# Patient Record
Sex: Male | Born: 1955
Health system: Southern US, Community
[De-identification: ages and names within clinical notes are randomized; demographics above are authoritative.]

## PROBLEM LIST (undated history)

## (undated) DIAGNOSIS — I1A0 Resistant hypertension: Secondary | ICD-10-CM

## (undated) DIAGNOSIS — M5126 Other intervertebral disc displacement, lumbar region: Secondary | ICD-10-CM

## (undated) DIAGNOSIS — G4719 Other hypersomnia: Principal | ICD-10-CM

## (undated) DIAGNOSIS — G4730 Sleep apnea, unspecified: Secondary | ICD-10-CM

## (undated) DIAGNOSIS — Z800 Family history of malignant neoplasm of digestive organs: Secondary | ICD-10-CM

## (undated) DIAGNOSIS — F419 Anxiety disorder, unspecified: Secondary | ICD-10-CM

## (undated) DIAGNOSIS — E669 Obesity, unspecified: Secondary | ICD-10-CM

## (undated) DIAGNOSIS — E782 Mixed hyperlipidemia: Secondary | ICD-10-CM

## (undated) DIAGNOSIS — I10 Essential (primary) hypertension: Secondary | ICD-10-CM

## (undated) DIAGNOSIS — K625 Hemorrhage of anus and rectum: Secondary | ICD-10-CM

## (undated) HISTORY — DX: Anxiety disorder, unspecified: F41.9

## (undated) HISTORY — DX: Resistant hypertension: I1A.0

## (undated) HISTORY — DX: Hemorrhage of anus and rectum: K62.5

## (undated) HISTORY — DX: Other hypersomnia: G47.19

## (undated) HISTORY — DX: Essential (primary) hypertension: I10

## (undated) HISTORY — DX: Family history of malignant neoplasm of digestive organs: Z80.0

## (undated) HISTORY — PX: TONSILLECTOMY: SUR1361

## (undated) HISTORY — DX: Mixed hyperlipidemia: E78.2

## (undated) HISTORY — DX: Sleep apnea, unspecified: G47.30

## (undated) HISTORY — PX: OTHER SURGICAL HISTORY: SHX169

## (undated) HISTORY — DX: Other intervertebral disc displacement, lumbar region: M51.26

## (undated) HISTORY — DX: Obesity, unspecified: E66.9

---

## 1997-09-23 ENCOUNTER — Ambulatory Visit (HOSPITAL_COMMUNITY): Admission: RE | Admit: 1997-09-23 | Discharge: 1997-09-23 | Payer: Self-pay | Admitting: Family Medicine

## 2002-04-01 ENCOUNTER — Emergency Department (HOSPITAL_COMMUNITY): Admission: EM | Admit: 2002-04-01 | Discharge: 2002-04-01 | Payer: Self-pay | Admitting: Emergency Medicine

## 2002-04-01 ENCOUNTER — Encounter: Payer: Self-pay | Admitting: Emergency Medicine

## 2003-01-14 ENCOUNTER — Observation Stay (HOSPITAL_COMMUNITY): Admission: AD | Admit: 2003-01-14 | Discharge: 2003-01-15 | Payer: Self-pay | Admitting: Emergency Medicine

## 2003-03-15 ENCOUNTER — Ambulatory Visit (HOSPITAL_BASED_OUTPATIENT_CLINIC_OR_DEPARTMENT_OTHER): Admission: RE | Admit: 2003-03-15 | Discharge: 2003-03-15 | Payer: Self-pay | Admitting: Orthopedic Surgery

## 2011-09-16 ENCOUNTER — Ambulatory Visit (INDEPENDENT_AMBULATORY_CARE_PROVIDER_SITE_OTHER): Payer: 59 | Admitting: Internal Medicine

## 2011-09-16 VITALS — BP 140/90 | HR 72 | Temp 98.0°F | Resp 16 | Ht 67.5 in | Wt 188.0 lb

## 2011-09-16 DIAGNOSIS — T63441A Toxic effect of venom of bees, accidental (unintentional), initial encounter: Secondary | ICD-10-CM

## 2011-09-16 DIAGNOSIS — G4730 Sleep apnea, unspecified: Secondary | ICD-10-CM

## 2011-09-16 DIAGNOSIS — T485X5A Adverse effect of other anti-common-cold drugs, initial encounter: Secondary | ICD-10-CM

## 2011-09-16 DIAGNOSIS — J3489 Other specified disorders of nose and nasal sinuses: Secondary | ICD-10-CM

## 2011-09-16 DIAGNOSIS — R609 Edema, unspecified: Secondary | ICD-10-CM

## 2011-09-16 DIAGNOSIS — T6391XA Toxic effect of contact with unspecified venomous animal, accidental (unintentional), initial encounter: Secondary | ICD-10-CM

## 2011-09-16 MED ORDER — AZELASTINE HCL 0.15 % NA SOLN: 2.0000 | Freq: Every day | NASAL | Status: DC

## 2011-09-16 MED ORDER — PREDNISONE 20 MG PO TABS: ORAL_TABLET | ORAL | Status: DC

## 2011-09-16 NOTE — Progress Notes (Signed)
  Subjective:    Patient ID: Alan Spencer, male    DOB: [birth year], 55 y.o.   MRN: [id number]  HPIBee sting 48 hours ago Swelling left forearm increasing/itching/no pain/Has had past cutaneous reactions to stinging insects  Also complaining of chronic nasal congestion/this has become worse with use of CPAP/he uses Afrin once or twice a day over the past year And is discovering to be less and less effective/and Flonase and Nasonex plus other nasal steroids have not been helpful/has intermittent allergy symptoms of the nose denies but not enough to take medicines every day No asthma or cough    Review of SystemsHypertension and idiopathic edema followed by cardiology/recent stress echo within normal limits/Lasix and spra lactone don't completely control his peripheral edema which started before he was on Norvasc No chest pains or palpitations GI and GU are negative     Objective:   Physical Exam Blood pressure 140/90 In no acute distress/mild overweight Conjunctiva slightly injected Nares boggy with clear rhinorrhea Throat clear No nodes or thyromegaly Left arm with 6 cm x 12 cm induration and pitting over the lateral forearm/no vesicles/general redness without specific lesions/no pustules/nontender       Assessment & Plan:   1. Rhinitis medicamentosa -He should improve with treatment for #3/will try adding astepro   at bedtime/If not effective will consider Singulair/DC Afrin  2. Sleep apnea   3. Bee sting reaction -Antihistamines/prednisone for 6 days  4. Edema -Idiopathic/followed for this and hypertension by cardiology   Followup one to 2 months

## 2013-03-29 ENCOUNTER — Other Ambulatory Visit: Payer: Self-pay | Admitting: Interventional Cardiology

## 2013-04-04 ENCOUNTER — Other Ambulatory Visit: Payer: Self-pay | Admitting: Interventional Cardiology

## 2013-04-07 NOTE — Telephone Encounter (Signed)
Phoned IN

## 2013-04-07 NOTE — Telephone Encounter (Signed)
Per Dr. Irish Lack, ok to refil. I will call into pharm.

## 2013-04-26 ENCOUNTER — Other Ambulatory Visit: Payer: Self-pay | Admitting: Interventional Cardiology

## 2013-11-10 ENCOUNTER — Other Ambulatory Visit: Payer: 59

## 2013-11-10 ENCOUNTER — Ambulatory Visit (INDEPENDENT_AMBULATORY_CARE_PROVIDER_SITE_OTHER): Payer: 59 | Admitting: Interventional Cardiology

## 2013-11-10 ENCOUNTER — Ambulatory Visit: Payer: Self-pay | Admitting: Interventional Cardiology

## 2013-11-10 ENCOUNTER — Encounter: Payer: Self-pay | Admitting: Cardiology

## 2013-11-10 ENCOUNTER — Encounter: Payer: Self-pay | Admitting: Interventional Cardiology

## 2013-11-10 VITALS — BP 172/78 | HR 65 | Ht 67.5 in | Wt 190.2 lb

## 2013-11-10 DIAGNOSIS — E782 Mixed hyperlipidemia: Secondary | ICD-10-CM | POA: Insufficient documentation

## 2013-11-10 DIAGNOSIS — E876 Hypokalemia: Secondary | ICD-10-CM | POA: Insufficient documentation

## 2013-11-10 DIAGNOSIS — I1A0 Resistant hypertension: Secondary | ICD-10-CM | POA: Insufficient documentation

## 2013-11-10 DIAGNOSIS — I10 Essential (primary) hypertension: Secondary | ICD-10-CM

## 2013-11-10 DIAGNOSIS — G4733 Obstructive sleep apnea (adult) (pediatric): Secondary | ICD-10-CM | POA: Insufficient documentation

## 2013-11-10 DIAGNOSIS — Z9989 Dependence on other enabling machines and devices: Secondary | ICD-10-CM

## 2013-11-10 MED ORDER — FUROSEMIDE 40 MG PO TABS
40.0000 mg | ORAL_TABLET | Freq: Every day | ORAL | Status: DC
Start: 2013-11-10 — End: 2015-03-06

## 2013-11-10 MED ORDER — POTASSIUM CHLORIDE CRYS ER 20 MEQ PO TBCR: 20.0000 meq | EXTENDED_RELEASE_TABLET | Freq: Every day | ORAL | Status: DC

## 2013-11-10 NOTE — Patient Instructions (Signed)
Your physician has recommended you make the following change in your medication:   1. Start Lasix 40 mg 1 tablet daily.   2. Start Klor-Con 20 MEQ 1 tablet daily.   Your physician has recommended that you have a sleep study. This test records several body functions during sleep, including: brain activity, eye movement, oxygen and carbon dioxide blood levels, heart rate and rhythm, breathing rate and rhythm, the flow of air through your mouth and nose, snoring, body muscle movements, and chest and belly movement.  Your physician recommends that you return for lab work in 1 week at Dr. Shon Baton office (bmet and calcium)  Your physician recommends that you schedule a follow-up appointment in: 3 months with Dr. Irish Lack.

## 2013-11-10 NOTE — Progress Notes (Signed)
Patient ID: Alan Spencer, male   DOB: 1955/12/22, 58 y.o.   MRN: [id number]    Alan Spencer, Alan Spencer Alan Spencer, Alan Spencer  Alan Spencer Phone: [phone] Fax:  [phone]  Date:  11/10/2013   ID:  Alan Spencer, Alan Spencer Mar 23, 1955, MRN [id number]  PCP:  No primary provider on file.      History of Present Illness: Alan Spencer is a 58 y.o. male who has had difficult to control hypertension. He has been on multiple medications. Saw him in 2013 prior to adjusting his medicines, his systolics were in the 244Q to 190 range consistently. This has improved significantly over the past year. He is now typically seen blood pressures in the 286-381 range systolic. He has not had any significant headaches, chest pain or short of breath. He plays tennis regularly for exercise. He feels that he had a lot of stress with his last two josb. His current job is much better.  When he is off from work, his blood pressure tends to be better.  Due to increased calcium, his spironolactone was stopped.      Wt Readings from Last 3 Encounters:  11/10/13 190 lb 3.2 oz (86.274 kg)  09/16/11 188 lb (85.276 kg)     Past Medical History  Diagnosis Date  . Essential hypertension, benign   . Mixed hyperlipidemia   . Anxiety   . Sleep apnea     uses CPAP  . HNP (herniated nucleus pulposus), lumbar     L4-L5  . Family history of colon cancer   . Rectal bleeding     Current Outpatient Prescriptions  Medication Sig Dispense Refill  . amLODipine (NORVASC) 10 MG tablet TAKE 1 TABLET BY MOUTH ONCE DAILY  90 tablet  2  . fish oil-omega-3 fatty acids 1000 MG capsule Take 2 g by mouth daily.      . irbesartan (AVAPRO) 300 MG tablet TAKE 1 TABLET BY MOUTH ONCE DAILY  90 tablet  2  . Multiple Vitamins-Minerals (MULTIVITAMIN WITH MINERALS) tablet Take 1 tablet by mouth daily.      . rosuvastatin (CRESTOR) 10 MG tablet Take 10 mg by mouth daily.       No current facility-administered medications for this visit.     Allergies:   No Known Allergies  Social History:  The patient  reports that he has never smoked. He does not have any smokeless tobacco history on file. He reports that he does not drink alcohol or use illicit drugs.   Family History:  The patient's family history includes Colon cancer in his paternal grandmother; Hypertension in his mother.   ROS:  Please see the history of present illness.  No nausea, vomiting.  No fevers, chills.  No focal weakness.  No dysuria.   All other systems reviewed and negative.   PHYSICAL EXAM: VS:  BP 172/78  Pulse 65  Ht 5' 7.5" (1.715 m)  Wt 190 lb 3.2 oz (86.274 kg)  BMI 29.33 kg/m2 Well nourished, well developed, in no acute distress HEENT: normal Neck: no JVD, no carotid bruits Cardiac:  normal S1, S2; RRR;  Lungs:  clear to auscultation bilaterally, no wheezing, rhonchi or rales Abd: soft, nontender, no hepatomegaly Ext: no edema Skin: warm and dry Neuro:   no focal abnormalities noted  EKG:     NSR, NSST  ASSESSMENT AND PLAN:  Hypertension, essential  Now off of Aldactone Tablet, 25 MG, 1 tablet, Orally, once a day- stopped for increased  calcium. Continue Amlodipine Besylate Tablet, 10 MG, 1 tablet, Orally, Once a day Continue Irbesartan Tablet, 300 MG, 1 tablet, Orally, Once a day    Alan Spencer 11/11/2012 10:59:37 AM > Alan Spencer,Alan Spencer 11/11/2012 11:06:57 AM > NSR, NSST   Notes: Blood pressure better. Sill some high readings up to the 403B systolic.  Continue current medicines. Needs sleep study- has been on CPAP or many years and has old machine.  Lasix was stopped due to low potassium, but swelling has gotten worse.  Will restart Lasix 40 mg daily with 47mq Potassium daily.  If BP stays elevated, will add back aldactone.  Lasix should lower serum calcium.  BP 162/72 by my recheck.   Mixed hyperlipidemia: TG have been quite high.  Wil have pharmacist assess and follow in lipid clinic. 10/28/13: Ca 10.4; potassium 3.3 ( on Lasix); LDL  147, TG 470, TC 280   OSA: repeat sleep study.  Better control of OSA may help BP.          Lab: Comp Metabolic Panel 87/95 GLUCOSE 101 H 70-99 - mg/dL  BUN 19  6-26 - mg/dL  CREATININE 1.01  0.60-1.30 - mg/dl  eGFR (NON-AFRICAN AMERICAN) 76  >60 - calc  eGFR (AFRICAN AMERICAN) 92  >60 - calc  SODIUM 141  136-145 - mmol/L  POTASSIUM 4.2  3.5-5.5 - mmol/L  CHLORIDE 107  98-107 - mmol/L  C02 29  22-32 - mmol/L  ANION GAP 8.5  6.0-20.0 - mmol/L  CALCIUM 11.2 H 8.6-10.3 - mg/dL  T PROTEIN 7.1  6.0-8.3 - g/dL  ALBUMIN 4.8  3.4-4.8 - g/dL  T.BILI 0.8  0.3-1.0 - mg/dL  ALP 83  38-126 - U/L  AST 29  0-39 - U/L  ALT 34  0-52 - U/L        Signed, JMina Marble MD, FWalnut [facility] Medical Center8/27/2015 11:46 AM

## 2013-11-11 ENCOUNTER — Telehealth: Payer: Self-pay | Admitting: Pharmacist

## 2013-11-11 NOTE — Telephone Encounter (Signed)
Recent labs from [facility] show TG of 470, TC 280, LDL 147, and HDL of 39 on 10/2013 labs.  In 2008 his TG were as high as 2092.  He was given Lovaza, Lopid, and Trilipix at various times around 2010.  Currently he is on Crestor and fish oil 2 g/d.  His renal fxn is normal.  Glucose is normal as well, so this is not a contributing factor.  He does not appear to be on any medications that are contributing to his TG levels.    Will have him add fenofibrate 160 mg qd (must take with largest meal of the day), and have him increase fish oil 1000 mg to 4 capsules per day with food.  Recheck lipid panel and hepatic panel in 8 weeks.  Please notify patient, update meds, and set up labs. Thanks.

## 2013-11-16 NOTE — Telephone Encounter (Signed)
agree

## 2013-11-16 NOTE — Telephone Encounter (Signed)
Spoke with pt and he was started on crestor right after labs were drawn. He will be going back for lipid check within the next couple of weeks. He will have labs faxed to [place]. He would like to wait until after next check to see where he stands before adding medication.

## 2013-12-26 ENCOUNTER — Encounter (HOSPITAL_BASED_OUTPATIENT_CLINIC_OR_DEPARTMENT_OTHER): Payer: 59

## 2014-01-10 ENCOUNTER — Other Ambulatory Visit: Payer: Self-pay | Admitting: Interventional Cardiology

## 2014-01-29 ENCOUNTER — Encounter: Payer: Self-pay | Admitting: *Deleted

## 2014-02-07 ENCOUNTER — Ambulatory Visit: Payer: 59 | Admitting: Interventional Cardiology

## 2014-02-17 ENCOUNTER — Ambulatory Visit (HOSPITAL_BASED_OUTPATIENT_CLINIC_OR_DEPARTMENT_OTHER): Payer: 59 | Attending: Cardiology | Admitting: Sleep Medicine

## 2014-02-17 DIAGNOSIS — G4710 Hypersomnia, unspecified: Secondary | ICD-10-CM | POA: Diagnosis present

## 2014-02-17 DIAGNOSIS — Z9989 Dependence on other enabling machines and devices: Secondary | ICD-10-CM

## 2014-02-17 DIAGNOSIS — Z6828 Body mass index (BMI) 28.0-28.9, adult: Secondary | ICD-10-CM | POA: Diagnosis not present

## 2014-02-17 DIAGNOSIS — G4733 Obstructive sleep apnea (adult) (pediatric): Secondary | ICD-10-CM | POA: Diagnosis not present

## 2014-02-27 ENCOUNTER — Ambulatory Visit: Payer: 59 | Admitting: Interventional Cardiology

## 2014-03-01 ENCOUNTER — Telehealth: Payer: Self-pay | Admitting: Cardiology

## 2014-03-01 DIAGNOSIS — Z9989 Dependence on other enabling machines and devices: Principal | ICD-10-CM

## 2014-03-01 DIAGNOSIS — G4733 Obstructive sleep apnea (adult) (pediatric): Secondary | ICD-10-CM

## 2014-03-01 NOTE — Telephone Encounter (Signed)
Please let patient know that she has severe OSA and set up for CPAP titration

## 2014-03-01 NOTE — Sleep Study (Signed)
   NAME: Alan Spencer DATE OF BIRTH:  [birth year] MEDICAL RECORD NUMBER [id number]  LOCATION: [facility]  PHYSICIAN: Garland Smouse R  DATE OF STUDY: 02/17/2014  SLEEP STUDY TYPE: Nocturnal Polysomnogram               REFERRING PHYSICIAN: Sueanne Margarita, MD  INDICATION FOR STUDY: excessive daytime sleepiness  EPWORTH SLEEPINESS SCORE: 11 HEIGHT: 5\' 8"  (172.7 cm)  WEIGHT: 185 lb (83.915 kg)    Body mass index is 28.14 kg/(m^2).  NECK SIZE: 16 in. BMI:28  MEDICATIONS: Reviewed in the chart  SLEEP ARCHITECTURE: The patient slept for a total of 160 minutes with no slow wave sleep and markedly reduced REM sleep at 4 minutes. The onset to sleep latency was delayed at 50 minutes and onset to REM latency was delayed at 207 minutes.  The sleep efficiency was reduced at 41%.    RESPIRATORY DATA: The patient had a total of 31 apneic events with 29 obstructive apneas and 2 mixed apneas.  He had 75 hypopneas.  Most events occurred in the supine position during NREM sleep.  The overall AHI was 40 events per hour consistent with severe obstructive sleep apnea hypopnea syndrome.  OXYGEN DATA: The O2 saturation nadir was 81% in NREM sleep and 84% in REM sleep.  The time spent with O2 sats less than 88% was 1 minute.  CARDIAC DATA: The patient maintained NSR throughout the study.   MOVEMENT/PARASOMNIA: There were no periodic limb movements or REM behavior disorders noted.  IMPRESSION/ RECOMMENDATION:   1.  Severe Obstructive sleep apnea/hypopnea syndrome with an AHI of 40 events per hour.  Most events occurred in the NREM supine position. 2.  Reduced sleep efficiency with increased frequency of arousals due to respiratory events.    3.  Abnormal sleep architecture with reduced REM sleep and prolonged REM onset latency.  4.  Frequent O2 desaturations associated with respiratory events.   5.  Recommend proceeding with CPAP titration. 6.  The patient should be counseled in good  sleep hygiene and weight loss. 7.  The patient should be counseled to avoid sleeping supine.  Signed: Sueanne Margarita Diplomate, American Board of Sleep Medicine  ELECTRONICALLY SIGNED ON:  03/01/2014, 2:19 PM [place] PH: [phone]   FX: [phone] [place]

## 2014-03-02 ENCOUNTER — Encounter: Payer: Self-pay | Admitting: Interventional Cardiology

## 2014-03-02 NOTE — Progress Notes (Signed)
Patient ID: Alan Spencer, male   DOB: 10-09-1955, 58 y.o.   MRN: [id number] Patient with BP to the 175/990 range.  Add aldactone 25 mg daily.  He will get a BMet on 12/23 at his office and get [place] results by 12/24. May need to stop supplemental potassium.  Patient reports LE edema.  Spironolactone may help.  He will f/u with me in Jan 2016.  Recent potassium 3.3, Cr 1.0  Jettie Booze, MD

## 2014-03-06 NOTE — Telephone Encounter (Signed)
Informed patient that he has severe OSA. CPAP titration ordered for scheduling

## 2014-03-06 NOTE — Addendum Note (Signed)
Addended by: Andres Ege on: 03/06/2014 11:58 AM   Modules accepted: Orders

## 2014-03-21 ENCOUNTER — Encounter (HOSPITAL_BASED_OUTPATIENT_CLINIC_OR_DEPARTMENT_OTHER): Payer: 59

## 2014-03-30 ENCOUNTER — Ambulatory Visit: Payer: 59 | Admitting: Interventional Cardiology

## 2014-04-05 ENCOUNTER — Ambulatory Visit: Payer: 59 | Admitting: Interventional Cardiology

## 2014-04-05 ENCOUNTER — Encounter: Payer: Self-pay | Admitting: Interventional Cardiology

## 2014-04-05 ENCOUNTER — Ambulatory Visit (INDEPENDENT_AMBULATORY_CARE_PROVIDER_SITE_OTHER): Payer: 59 | Admitting: Interventional Cardiology

## 2014-04-05 VITALS — BP 164/90 | HR 80 | Ht 68.0 in | Wt 189.0 lb

## 2014-04-05 DIAGNOSIS — I10 Essential (primary) hypertension: Secondary | ICD-10-CM

## 2014-04-05 DIAGNOSIS — E782 Mixed hyperlipidemia: Secondary | ICD-10-CM

## 2014-04-05 DIAGNOSIS — Z9989 Dependence on other enabling machines and devices: Secondary | ICD-10-CM

## 2014-04-05 DIAGNOSIS — G4733 Obstructive sleep apnea (adult) (pediatric): Secondary | ICD-10-CM

## 2014-04-05 MED ORDER — CARVEDILOL 3.125 MG PO TABS: 3.1250 mg | ORAL_TABLET | Freq: Two times a day (BID) | ORAL | Status: DC

## 2014-04-05 NOTE — Progress Notes (Signed)
Patient ID: Alan Spencer, male   DOB: 10/01/1955, 59 y.o.   MRN: [id number] Patient ID: Alan Spencer, male   DOB: 04-Jul-1955, 59 y.o.   MRN: [id number]    [place] [place], [place]  [address] Phone: [phone] Fax:  [phone]  Date:  04/05/2014   ID:  Alan Spencer, Alan Spencer June 30, 1955, MRN [id number]  PCP:  Donnajean Lopes, MD      History of Present Illness: Alan Spencer is a 59 y.o. male who has had difficult to control hypertension. He has been on multiple medications.  Systolics have been 979-892J systolic. Saw him in 2013 prior to adjusting his medicines, his systolics were in the 194R to 190 range consistently. This has improved significantly over the past year.  He has not had any significant headaches, chest pain or short of breath. He plays tennis regularly for exercise. Stress level is ok at work. His current job is much better.  When he is off from work, his blood pressure tends to be better.  Aldactone was increased to 50 mg daily.  POtassium and Cr were checked with his PMD and were stable.         Wt Readings from Last 3 Encounters:  04/05/14 189 lb (85.73 kg)  02/17/14 185 lb (83.915 kg)  11/10/13 190 lb 3.2 oz (86.274 kg)     Past Medical History  Diagnosis Date  . Essential hypertension, benign   . Mixed hyperlipidemia   . Anxiety   . Sleep apnea     uses CPAP  . HNP (herniated nucleus pulposus), lumbar     L4-L5  . Family history of colon cancer   . Rectal bleeding     Current Outpatient Prescriptions  Medication Sig Dispense Refill  . amLODipine (NORVASC) 10 MG tablet TAKE 1 TABLET BY MOUTH ONCE DAILY 90 tablet 0  . fish oil-omega-3 fatty acids 1000 MG capsule Take 2 g by mouth daily.    . furosemide (LASIX) 40 MG tablet Take 1 tablet (40 mg total) by mouth daily. 90 tablet 3  . irbesartan (AVAPRO) 300 MG tablet TAKE 1 TABLET BY MOUTH ONCE DAILY 90 tablet 0  . Multiple Vitamins-Minerals (MULTIVITAMIN WITH MINERALS) tablet Take 1  tablet by mouth daily.    . potassium chloride SA (KLOR-CON M20) 20 MEQ tablet Take 1 tablet (20 mEq total) by mouth daily. 90 tablet 3  . rosuvastatin (CRESTOR) 10 MG tablet Take 10 mg by mouth daily.    Marland Kitchen spironolactone (ALDACTONE) 25 MG tablet Take 50 mg by mouth daily.     No current facility-administered medications for this visit.    Allergies:   No Known Allergies  Social History:  The patient  reports that he has never smoked. He does not have any smokeless tobacco history on file. He reports that he does not drink alcohol or use illicit drugs.   Family History:  The patient's family history includes Colon cancer in his paternal grandmother; Hypertension in his mother.   ROS:  Please see the history of present illness.  No nausea, vomiting.  No fevers, chills.  No focal weakness.  No dysuria.   All other systems reviewed and negative.   PHYSICAL EXAM: VS:  BP 164/90 mmHg  Pulse 80  Ht '5\' 8"'  (1.727 m)  Wt 189 lb (85.73 kg)  BMI 28.74 kg/m2 Well nourished, well developed, in no acute distress HEENT: normal Neck: no JVD, no carotid bruits Cardiac:  normal  S1, S2; RRR;  Lungs:  clear to auscultation bilaterally, no wheezing, rhonchi or rales Abd: soft, nontender, no hepatomegaly Ext: no edema Skin: warm and dry Neuro:   no focal abnormalities noted Psych: normal affect  EKG:     NSR, NSST  ASSESSMENT AND PLAN:  Hypertension, essential  Aldactone Tablet, 50 MG, 1 tablet, Orally, once a day Continue Amlodipine Besylate Tablet, 10 MG, 1 tablet, Orally, Once a day Continue Irbesartan Tablet, 300 MG, 1 tablet, Orally, Once a day Start Carvedilol 3.125 mg BID   Renal Duplex.- he will get this done at [facility]   Notes: Blood pressure better. Still some high readings up to the 098J systolic.  Continue current medicines. Needs sleep study- has been on CPAP or many years and has old machine.  Lasix was stopped due to low potassium, but swelling has gotten worse.   Continue  Lasix 40 mg daily with 69mq Potassium daily.  If BP stays elevated, will add back aldactone.  Lasix should lower serum calcium.  BP 154/72 by my recheck.   Mixed hyperlipidemia: TG have been quite high.  Wil have pharmacist assess and follow in lipid clinic. 10/28/13: Ca 10.4; potassium 3.3 ( on Lasix); LDL 147, TG 470, TC 280  Lipids were checked on 03/08/14 (per his report): TG: 572   Chol: 177  LDL 33  HDL 30        OSA: He needs repeat sleep study.  Better control of OSA may help BP.     [facility]    Lab: Comp Metabolic Panel 81/91 GLUCOSE 101 H 70-99 - mg/dL  BUN 19  6-26 - mg/dL  CREATININE 1.01  0.60-1.30 - mg/dl  eGFR (NON-AFRICAN AMERICAN) 76  >60 - calc  eGFR (AFRICAN AMERICAN) 92  >60 - calc  SODIUM 141  136-145 - mmol/L  POTASSIUM 4.2  3.5-5.5 - mmol/L  CHLORIDE 107  98-107 - mmol/L  C02 29  22-32 - mmol/L  ANION GAP 8.5  6.0-20.0 - mmol/L  CALCIUM 11.2 H 8.6-10.3 - mg/dL  T PROTEIN 7.1  6.0-8.3 - g/dL  ALBUMIN 4.8  3.4-4.8 - g/dL  T.BILI 0.8  0.3-1.0 - mg/dL  ALP 83  38-126 - U/L  AST 29  0-39 - U/L  ALT 34  0-52 - U/L        Signed, JMina Marble MD, FSt [facility]1/20/2016 3:03 PM

## 2014-04-05 NOTE — Patient Instructions (Signed)
Your physician has recommended you make the following change in your medication:  1) START taking Carvedilol 3.125mg  twice daily with meals  Your physician has requested that you have a renal artery duplex at [facility]. During this test, an ultrasound is used to evaluate blood flow to the kidneys. Allow one hour for this exam. Do not eat after midnight the day before and avoid carbonated beverages. Take your medications as you usually do.   You have been referred to the lipid clinic.  Your physician recommends that you schedule a follow-up appointment in: 3 months with Dr. Irish Lack.

## 2014-04-06 ENCOUNTER — Telehealth: Payer: Self-pay | Admitting: Interventional Cardiology

## 2014-04-06 NOTE — Telephone Encounter (Signed)
pt did not want to schedule appointment with Lipid Clinic at this time, he stated he is going to try other things with his PCP   By Justice Britain   I will forward this message to Dr. Irish Lack as an Juluis Rainier.

## 2014-04-07 ENCOUNTER — Encounter (HOSPITAL_BASED_OUTPATIENT_CLINIC_OR_DEPARTMENT_OTHER): Payer: 59

## 2014-04-21 ENCOUNTER — Ambulatory Visit (HOSPITAL_BASED_OUTPATIENT_CLINIC_OR_DEPARTMENT_OTHER): Payer: 59 | Attending: Cardiology | Admitting: *Deleted

## 2014-04-21 VITALS — Ht 68.0 in | Wt 184.0 lb

## 2014-04-21 DIAGNOSIS — G4730 Sleep apnea, unspecified: Secondary | ICD-10-CM | POA: Insufficient documentation

## 2014-04-21 DIAGNOSIS — G4733 Obstructive sleep apnea (adult) (pediatric): Secondary | ICD-10-CM | POA: Diagnosis not present

## 2014-04-21 DIAGNOSIS — Z9989 Dependence on other enabling machines and devices: Secondary | ICD-10-CM

## 2014-04-25 ENCOUNTER — Telehealth: Payer: Self-pay | Admitting: Cardiology

## 2014-04-25 DIAGNOSIS — Z9989 Dependence on other enabling machines and devices: Principal | ICD-10-CM

## 2014-04-25 DIAGNOSIS — G4733 Obstructive sleep apnea (adult) (pediatric): Secondary | ICD-10-CM

## 2014-04-25 NOTE — Telephone Encounter (Signed)
Please let patient know that he had successful CPAP titration and let [facility] know that orders for CPAP are in EPIC.  Set patient up for OV with me in 10 weeks

## 2014-04-25 NOTE — Sleep Study (Signed)
   NAME: Alan Spencer DATE OF BIRTH:  1955-05-01 MEDICAL RECORD NUMBER [id number]  LOCATION: [facility]  PHYSICIAN: TURNER,TRACI R  DATE OF STUDY: 04/21/2014  SLEEP STUDY TYPE: Positive Airway Pressure Titration               REFERRING PHYSICIAN: Sueanne Margarita, MD  INDICATION FOR STUDY: Severe Obstructive sleep apnea with AHI 40 events per hour  EPWORTH SLEEPINESS SCORE: 11 HEIGHT: 5\' 8"  (172.7 cm)  WEIGHT: 184 lb (83.462 kg)    Body mass index is 27.98 kg/(m^2).  NECK SIZE: 16 in.  MEDICATIONS: Reviewed in the chart  SLEEP ARCHITECTURE: The patient slept for a total of 320 minutes with 2.5 minutes of slow wave sleep and 79 minutes of REM sleep.  The onset to sleep latency was 4.5 minutes and onset to REM sleep latency was 58 minutes.  The sleep efficiency was normal at 85%.    RESPIRATORY DATA: The patient was started on CPAP at 5cm H2O and titrated for respiratory events and snoring to 11cm H2O.  The patient was able to achieve optimum pressure at 11cm H2O during REM sleep in the supine position with no further respiratory events.  The AHI at 11cm H2O was 0.6.  The sleep efficiency at this pressure was 94% with no oxygen desaturations.  OXYGEN DATA: The lowest oxygen desaturation during the titration was 91%.     CARDIAC DATA: The patient maintained NSR during the study with occasional sinus pauses up to 2 seconds.  MOVEMENT/PARASOMNIA: There were increased periodic limb movements during the study with an index of 9 movements per hour.  There were no REM behavior disorders noted.    IMPRESSION/ RECOMMENDATION:   1.  Successful CPAP titration to 11cm H2O.  The patient was able to maintain REM supine sleep at this pressure with no further respiratory events. 2.  Snoring was eliminated with CPAP. 3.  Sleep efficiency was normal with no significant sleep fragmentation. 4.  The patient should be counseled in good sleep hygiene and weight loss. 5.  The patient  should be counseled to avoid sleeping int he supine position. 6.  Recommend ResMed CPAP with heated humidifier, C flex of 3 and [employer] Pilaro nasal pillow mask with chin strap at 11cm H2O.    Signed: Sueanne Margarita Diplomate, American Board of Sleep Medicine  ELECTRONICALLY SIGNED ON:  04/25/2014, 6:34 AM [place] PH: [phone]   FX: [phone] [place]

## 2014-04-25 NOTE — Addendum Note (Signed)
Addended by: Sueanne Margarita on: 04/25/2014 06:46 AM   Modules accepted: Orders

## 2014-04-26 ENCOUNTER — Other Ambulatory Visit: Payer: Self-pay | Admitting: Interventional Cardiology

## 2014-04-26 NOTE — Telephone Encounter (Signed)
Left message to call back  

## 2014-04-27 NOTE — Telephone Encounter (Signed)
Patient informed of results and verbal understanding expressed.  [place] notified. OV scheduled for 5/10. Patient agrees with treatment plan.

## 2014-07-24 ENCOUNTER — Other Ambulatory Visit: Payer: Self-pay | Admitting: Interventional Cardiology

## 2014-07-27 NOTE — Progress Notes (Signed)
Cardiology Office Note   Date:  07/28/2014   ID:  Alan Spencer, DOB 1956/01/29, MRN [id number]  PCP:  Donnajean Lopes, MD    Chief Complaint  Patient presents with  . Follow-up    sleep apnea  . Follow-up    hypertension      History of Present Illness: Alan Spencer is a 59 y.o. male who presents for evaluation of OSA.  The patient has had sleep apnea for years and recently told Dr. Irish Lack he wanted a new machine.  He underwent PSG showing severe OSA with an AHI of 40 events per hour mainly in the supine position and during NREM sleep.  He had oxygen desaturations as low as 81%.  He underwent CPA titration to 11cm H2O.  He now presents for followup.  He tolerates his CPAP device well.  He tolerates the nasal mask and feels the pressure is adequate but may be a little on the low side.  He feels sleepy in the am but does not have daytime sleepiness.  He goes to bed at [facility] and get up at [facility].      Past Medical History  Diagnosis Date  . Essential hypertension, benign   . Mixed hyperlipidemia   . Anxiety   . Sleep apnea     uses CPAP  . HNP (herniated nucleus pulposus), lumbar     L4-L5  . Family history of colon cancer   . Rectal bleeding     Past Surgical History  Procedure Laterality Date  . Tonsillectomy    . Broken left elbow       Current Outpatient Prescriptions  Medication Sig Dispense Refill  . amLODipine (NORVASC) 10 MG tablet TAKE 1 TABLET BY MOUTH ONCE DAILY 30 tablet 0  . Armodafinil 150 MG tablet Take 150 mg by mouth as needed.    . carvedilol (COREG) 3.125 MG tablet Take 1 tablet (3.125 mg total) by mouth 2 (two) times daily with a meal. 180 tablet 3  . fish oil-omega-3 fatty acids 1000 MG capsule Take 2 g by mouth daily.    . furosemide (LASIX) 40 MG tablet Take 1 tablet (40 mg total) by mouth daily. 90 tablet 3  . irbesartan (AVAPRO) 300 MG tablet TAKE 1 TABLET BY MOUTH ONCE DAILY 30 tablet 0  . Multiple Vitamins-Minerals (MULTIVITAMIN WITH  MINERALS) tablet Take 1 tablet by mouth daily.    . potassium chloride SA (KLOR-CON M20) 20 MEQ tablet Take 1 tablet (20 mEq total) by mouth daily. 90 tablet 3  . rosuvastatin (CRESTOR) 10 MG tablet Take 10 mg by mouth daily.    Marland Kitchen spironolactone (ALDACTONE) 25 MG tablet Take 50 mg by mouth daily.     No current facility-administered medications for this visit.    Allergies:   Review of patient's allergies indicates no known allergies.    Social History:  The patient  reports that he has never smoked. He does not have any smokeless tobacco history on file. He reports that he does not drink alcohol or use illicit drugs.   Family History:  The patient's family history includes Colon cancer in his paternal grandmother; Hypertension in his mother; Kidney failure in his father.    ROS:  Please see the history of present illness.   Otherwise, review of systems are positive for none.   All other systems are reviewed and negative.    PHYSICAL EXAM: VS:  BP 140/70 mmHg  Pulse 57  Ht 5\' 8"  (  1.727 m)  Wt 192 lb 1.9 oz (87.145 kg)  BMI 29.22 kg/m2  SpO2 96% , BMI Body mass index is 29.22 kg/(m^2). GEN: Well nourished, well developed, in no acute distress HEENT: normal Neck: no JVD, carotid bruits, or masses Cardiac: RRR; no murmurs, rubs, or gallops,no edema  Respiratory:  clear to auscultation bilaterally, normal work of breathing GI: soft, nontender, nondistended, + BS MS: no deformity or atrophy Skin: warm and dry, no rash Neuro:  Strength and sensation are intact Psych: euthymic mood, full affect   EKG:  EKG was ordered today and showed NSR with PAC's and sinus arrhythmia and IRBBB, LAE and nonspecific T wave abnormality    Recent Labs: No results found for requested labs within last 365 days.    Lipid Panel No results found for: CHOL, TRIG, HDL, CHOLHDL, VLDL, LDLCALC, LDLDIRECT    Wt Readings from Last 3 Encounters:  07/28/14 192 lb 1.9 oz (87.145 kg)  04/21/14 184 lb  (83.462 kg)  04/05/14 189 lb (85.73 kg)        ASSESSMENT AND PLAN:  1.  Severe OSA with AHI of 40 events per hour.  He had successful CPAP titration to 11cm H2O and is tolerating his device well.  He will continue on current CPAP settings. He does have some nonrestorative sleep due to poor sleep hygiene.  We discussed trying to avoid sleeping in the supine position and also going to bed earlier to get 8 hours of sleep. 2.  HTN - well controlled   Current medicines are reviewed at length with the patient today.  The patient does not have concerns regarding medicines.  The following changes have been made:  no change  Labs/ tests ordered today include: see above assessment and plan No orders of the defined types were placed in this encounter.     Disposition:   FU with me in 6 months   Signed, Sueanne Margarita, MD  07/28/2014 2:52 PM    [facility] [place], [place], [place]  [address] Phone: [phone]; Fax: [phone]

## 2014-07-28 ENCOUNTER — Ambulatory Visit (INDEPENDENT_AMBULATORY_CARE_PROVIDER_SITE_OTHER): Payer: 59 | Admitting: Cardiology

## 2014-07-28 ENCOUNTER — Encounter: Payer: Self-pay | Admitting: Cardiology

## 2014-07-28 VITALS — BP 140/70 | HR 57 | Ht 68.0 in | Wt 192.1 lb

## 2014-07-28 DIAGNOSIS — I10 Essential (primary) hypertension: Secondary | ICD-10-CM | POA: Diagnosis not present

## 2014-07-28 DIAGNOSIS — G4733 Obstructive sleep apnea (adult) (pediatric): Secondary | ICD-10-CM | POA: Diagnosis not present

## 2014-07-28 DIAGNOSIS — Z9989 Dependence on other enabling machines and devices: Principal | ICD-10-CM

## 2014-07-28 NOTE — Addendum Note (Signed)
Addended by: Harland German A on: 07/28/2014 03:12 PM   Modules accepted: Orders

## 2014-07-28 NOTE — Patient Instructions (Signed)
Medication Instructions:  Your physician recommends that you continue on your current medications as directed. Please refer to the Current Medication list given to you today.   Labwork: None  Testing/Procedures: None  Follow-Up: Your physician wants you to follow-up in: 6 months with Dr. Radford Pax. You will receive a reminder letter in the mail two months in advance. If you don't receive a letter, please call our office to schedule the follow-up appointment.   Any Other Special Instructions Will Be Listed Below (If Applicable). You will be hearing from [place] soon to get a download.

## 2014-08-22 ENCOUNTER — Other Ambulatory Visit: Payer: Self-pay | Admitting: Interventional Cardiology

## 2014-09-26 ENCOUNTER — Other Ambulatory Visit: Payer: Self-pay | Admitting: Interventional Cardiology

## 2014-09-26 ENCOUNTER — Other Ambulatory Visit: Payer: Self-pay

## 2014-09-26 MED ORDER — AMLODIPINE BESYLATE 10 MG PO TABS: ORAL_TABLET | ORAL | Status: DC

## 2014-09-26 MED ORDER — IRBESARTAN 300 MG PO TABS: ORAL_TABLET | ORAL | Status: DC

## 2014-11-01 ENCOUNTER — Other Ambulatory Visit: Payer: Self-pay | Admitting: Interventional Cardiology

## 2015-01-29 ENCOUNTER — Other Ambulatory Visit: Payer: Self-pay | Admitting: Interventional Cardiology

## 2015-02-13 ENCOUNTER — Encounter (HOSPITAL_COMMUNITY): Payer: Self-pay | Admitting: Radiology

## 2015-02-13 ENCOUNTER — Encounter (HOSPITAL_COMMUNITY): Payer: Self-pay | Admitting: Interventional Cardiology

## 2015-03-06 ENCOUNTER — Other Ambulatory Visit: Payer: Self-pay | Admitting: Interventional Cardiology

## 2015-03-22 ENCOUNTER — Encounter: Payer: Self-pay | Admitting: Cardiology

## 2015-03-22 ENCOUNTER — Ambulatory Visit (INDEPENDENT_AMBULATORY_CARE_PROVIDER_SITE_OTHER): Payer: 59 | Admitting: Cardiology

## 2015-03-22 VITALS — BP 184/100 | HR 67 | Ht 68.0 in | Wt 196.2 lb

## 2015-03-22 DIAGNOSIS — Z9989 Dependence on other enabling machines and devices: Principal | ICD-10-CM

## 2015-03-22 DIAGNOSIS — I10 Essential (primary) hypertension: Secondary | ICD-10-CM | POA: Diagnosis not present

## 2015-03-22 DIAGNOSIS — G4733 Obstructive sleep apnea (adult) (pediatric): Secondary | ICD-10-CM

## 2015-03-22 NOTE — Patient Instructions (Signed)

## 2015-03-22 NOTE — Progress Notes (Addendum)
Cardiology Office Note   Date:  03/22/2015   ID:  Alan Spencer, DOB October 31, 1955, MRN [id number]  PCP:  Alan Lopes, MD    Chief Complaint  Patient presents with  . Sleep Apnea      History of Present Illness: Alan Spencer is a 60 y.o. male who presents for followup of OSA. He has a history of severe OSA with an AHI of 40 events per hour mainly in the supine position and during NREM sleep. He is on CPAP at 11cm H2O.  He tolerates his CPAP device well. He tolerates the nasal pillow mask and feels the pressure is adequate. He occasionally feels sleepy in the am does have daytime sleepiness. He takes Nuvigil PRN which helps.  He goes to bed at MN-1am and gets up at 6am.     Past Medical History  Diagnosis Date  . Essential hypertension, benign   . Mixed hyperlipidemia   . Anxiety   . Sleep apnea     uses CPAP  . HNP (herniated nucleus pulposus), lumbar     L4-L5  . Family history of colon cancer   . Rectal bleeding     Past Surgical History  Procedure Laterality Date  . Tonsillectomy    . Broken left elbow       Current Outpatient Prescriptions  Medication Sig Dispense Refill  . amLODipine (NORVASC) 10 MG tablet Take 1 tablet (10 mg total) by mouth daily. 30 tablet 1  . Armodafinil 150 MG tablet Take 150 mg by mouth as needed.    . carvedilol (COREG) 3.125 MG tablet Take 1 tablet (3.125 mg total) by mouth 2 (two) times daily with a meal. 180 tablet 3  . Cholecalciferol (VITAMIN D3) 50000 units CAPS Take 1 tablet by mouth once a week.  0  . furosemide (LASIX) 40 MG tablet TAKE 1 TABLET BY MOUTH ONCE DAILY 30 tablet 0  . irbesartan (AVAPRO) 300 MG tablet TAKE 1 TABLET BY MOUTH ONCE DAILY 30 tablet 0  . Multiple Vitamins-Minerals (MULTIVITAMIN WITH MINERALS) tablet Take 1 tablet by mouth daily.    . potassium chloride SA (K-DUR,KLOR-CON) 20 MEQ tablet TAKE 1 TABLET BY MOUTH ONCE DAILY 30 tablet 0  . rosuvastatin (CRESTOR) 10 MG tablet Take  10 mg by mouth daily.    Marland Kitchen VASCEPA 1 g CAPS Take 4 tablets by mouth daily.  3   No current facility-administered medications for this visit.    Allergies:   Review of patient's allergies indicates no known allergies.    Social History:  The patient  reports that he has never smoked. He does not have any smokeless tobacco history on file. He reports that he does not drink alcohol or use illicit drugs.   Family History:  The patient's family history includes Colon cancer in his paternal grandmother; Hypertension in his mother; Kidney failure in his father.    ROS:  Please see the history of present illness.   Otherwise, review of systems are positive for none.   All other systems are reviewed and negative.    PHYSICAL EXAM: VS:  BP 184/100 mmHg  Pulse 67  Ht 5\' 8"  (1.727 m)  Wt 196 lb 3.2 oz (88.996 kg)  BMI 29.84 kg/m2  SpO2 97% , BMI Body mass index is 29.84 kg/(m^2). GEN: Well nourished, well developed, in no acute distress HEENT: normal Neck: no JVD, carotid bruits,  or masses Cardiac: RRR; no murmurs, rubs, or gallops,no edema  Respiratory:  clear to auscultation bilaterally, normal work of breathing GI: soft, nontender, nondistended, + BS MS: no deformity or atrophy Skin: warm and dry, no rash Neuro:  Strength and sensation are intact Psych: euthymic mood, full affect   EKG:  EKG is not ordered today.    Recent Labs: No results found for requested labs within last 365 days.    Lipid Panel No results found for: CHOL, TRIG, HDL, CHOLHDL, VLDL, LDLCALC, LDLDIRECT    Wt Readings from Last 3 Encounters:  03/22/15 196 lb 3.2 oz (88.996 kg)  07/28/14 192 lb 1.9 oz (87.145 kg)  04/21/14 184 lb (83.462 kg)    ASSESSMENT AND PLAN:  1. Severe OSA with AHI of 40 events per hour.  He will continue on current CPAP settings. I will get a d/l from the DME.  Patient needs new CPAP machine.  Will place order and continue with current settings. 2. HTN - poorly controlled -  at home it runs 140/28mmHg.  He took Aleve last night.  I have asked him to check his BP daily for a week and call with the results.      Current medicines are reviewed at length with the patient today.  The patient does not have concerns regarding medicines.  The following changes have been made:  no change  Labs/ tests ordered today: See above Assessment and Plan No orders of the defined types were placed in this encounter.     Disposition:   FU with me in 1 year  Signed, Alan Margarita, MD  03/22/2015 10:59 AM    [facility] [place], [place], [place]  [address] Phone: [phone]; Fax: [phone]

## 2015-03-24 ENCOUNTER — Encounter: Payer: Self-pay | Admitting: Cardiology

## 2015-03-26 ENCOUNTER — Telehealth: Payer: Self-pay

## 2015-03-26 DIAGNOSIS — I10 Essential (primary) hypertension: Secondary | ICD-10-CM

## 2015-03-26 NOTE — Telephone Encounter (Signed)
BP too high - increase Coreg to 6.25mg  BID and followup in HTN clinic in 1 week         Traci TUrner                                                            BP:        03/21/2015 147/85    03/22/2015 157/80    03/23/2015 155/83     Left message to call back.

## 2015-03-27 ENCOUNTER — Telehealth: Payer: Self-pay | Admitting: *Deleted

## 2015-03-27 ENCOUNTER — Encounter: Payer: Self-pay | Admitting: Cardiology

## 2015-03-27 DIAGNOSIS — G4733 Obstructive sleep apnea (adult) (pediatric): Secondary | ICD-10-CM

## 2015-03-27 NOTE — Addendum Note (Signed)
Addended by: Fransico Him R on: 03/27/2015 04:35 PM   Modules accepted: Miquel Dunn

## 2015-03-27 NOTE — Telephone Encounter (Signed)
Office note was addended

## 2015-03-27 NOTE — Telephone Encounter (Signed)
Patient was seen by Dr. Radford Pax and requested to get new supplies for CPAP.  His machine is no longer supported by insurance due to it's age.   He was told by [place] that if Dr. Radford Pax would add to her office note from when he was in the office, and request a replacement CPAP at current settings that his insurance would approve it.  However, this CANNOT be done in a telephone note.  It must be added on to his office visit note.   Will forward to Dr. Radford Pax for review.

## 2015-03-28 MED ORDER — CARVEDILOL 6.25 MG PO TABS: 6.2500 mg | ORAL_TABLET | Freq: Two times a day (BID) | ORAL | Status: DC

## 2015-03-28 MED FILL — CARVEDILOL 6.25 MG TABLET: 6.25 | 90 days supply | Qty: 180 | Fill #0

## 2015-03-28 NOTE — Telephone Encounter (Signed)
Get 48 hour ambulatory BP monitor

## 2015-03-28 NOTE — Telephone Encounter (Signed)
Message     Hi Alan Spencer,        Please call Rx to [facility].         Call me on my cell phone [phone] & I will discuss pharmacist.         Thinking about ABPM.         I can check w/ our pharmacist - we do that here at [facility].         Coreg 6.25 mg BID called in to [facility]

## 2015-03-28 NOTE — Telephone Encounter (Signed)
Patient st he understands to increase Coreg to 6.25 mg BID, but does not want to "kill a fly with a bazooka" by making an appointment next week to see Gay Filler in the HTN Clinic. Patient requests to either check his BP daily and send a message with the results or have Dr. Radford Pax send an order for an ambulatory BP monitor to be done at [facility] and the results sent to her.  To Dr. Radford Pax.

## 2015-03-29 ENCOUNTER — Other Ambulatory Visit: Payer: Self-pay | Admitting: Interventional Cardiology

## 2015-03-29 ENCOUNTER — Encounter: Payer: Self-pay | Admitting: Cardiology

## 2015-03-29 ENCOUNTER — Other Ambulatory Visit: Payer: Self-pay | Admitting: *Deleted

## 2015-03-29 MED FILL — IRBESARTAN 300 MG TABLET: 300 | 30 days supply | Qty: 30 | Fill #0

## 2015-03-29 MED FILL — AMLODIPINE BESYLATE 10 MG T: 10 | 30 days supply | Qty: 30 | Fill #0

## 2015-03-29 NOTE — Addendum Note (Signed)
Addended by: Andres Ege on: 03/29/2015 02:38 PM   Modules accepted: Orders

## 2015-03-29 NOTE — Telephone Encounter (Signed)
Orders have been placed. Message has been sent to [facility].

## 2015-03-29 NOTE — Telephone Encounter (Signed)
MyChart message sent to patient offering 24 hour BP monitor per Dr. Radford Pax.  Awaiting reply.

## 2015-04-03 DIAGNOSIS — G4733 Obstructive sleep apnea (adult) (pediatric): Secondary | ICD-10-CM | POA: Diagnosis not present

## 2015-04-03 MED FILL — POTASSIUM CL ER 20 MEQ TAB: 20 | 90 days supply | Qty: 90 | Fill #1

## 2015-04-06 ENCOUNTER — Other Ambulatory Visit: Payer: Self-pay | Admitting: Interventional Cardiology

## 2015-04-06 MED FILL — VASCEPA 1 GM CAPSULE: 1 | 90 days supply | Qty: 360 | Fill #5

## 2015-04-06 MED FILL — FUROSEMIDE 40 MG TABLET: 40 | 30 days supply | Qty: 30 | Fill #0

## 2015-04-26 ENCOUNTER — Telehealth: Payer: Self-pay

## 2015-04-26 DIAGNOSIS — I10 Essential (primary) hypertension: Secondary | ICD-10-CM

## 2015-04-26 MED ORDER — CARVEDILOL 12.5 MG PO TABS: 12.5000 mg | ORAL_TABLET | Freq: Two times a day (BID) | ORAL | Status: DC

## 2015-04-26 MED ORDER — CARVEDILOL PHOSPHATE ER 10 MG PO CP24: 30.0000 mg | ORAL_CAPSULE | Freq: Every day | ORAL | Status: DC

## 2015-04-26 MED FILL — CARVEDILOL 12.5 MG TABLET: 12.5 | 90 days supply | Qty: 180 | Fill #0

## 2015-04-26 NOTE — Telephone Encounter (Signed)
Patient called to report Coreg CR is too expensive. Coreg CR cancelled and Coreg 12.5 BID ordered. Patient agrees with treatment plan.

## 2015-04-26 NOTE — Addendum Note (Signed)
Addended by: Harland German A on: 04/26/2015 03:20 PM   Modules accepted: Orders, Medications

## 2015-04-26 NOTE — Telephone Encounter (Signed)
Patient called to discuss Coreg options.  Per MyChart messages, patient was to either 1) increase Coreg to 12.5 mg BID or 2) stop Coreg and start Coreg CR 30 mg daily. Patient was to research pricing options and call back. Patient st he wishes for Coreg CR to be called in to [facility] for 90 days. He understands to check BP daily for a week and call/send message with results. Patient agrees with treatment plan.

## 2015-04-30 ENCOUNTER — Other Ambulatory Visit: Payer: Self-pay | Admitting: *Deleted

## 2015-04-30 MED ORDER — IRBESARTAN 300 MG PO TABS: 300.0000 mg | ORAL_TABLET | Freq: Every day | ORAL | Status: DC

## 2015-04-30 MED FILL — ARMODAFINIL 150 MG TABLET: 150 | 90 days supply | Qty: 90 | Fill #1

## 2015-04-30 MED FILL — IRBESARTAN 300 MG TABLET: 300 | 30 days supply | Qty: 30 | Fill #0

## 2015-04-30 MED FILL — FUROSEMIDE 40 MG TABLET: 40 | 30 days supply | Qty: 30 | Fill #1

## 2015-05-04 DIAGNOSIS — G4733 Obstructive sleep apnea (adult) (pediatric): Secondary | ICD-10-CM | POA: Diagnosis not present

## 2015-05-08 DIAGNOSIS — R52 Pain, unspecified: Secondary | ICD-10-CM | POA: Diagnosis not present

## 2015-05-11 MED FILL — OSELTAMIVIR PHOS 75 MG CAP: 75 | 10 days supply | Qty: 10 | Fill #0

## 2015-05-11 MED FILL — AMLODIPINE BESYLATE 10 MG T: 10 | 90 days supply | Qty: 90 | Fill #0

## 2015-05-14 DIAGNOSIS — R05 Cough: Secondary | ICD-10-CM | POA: Diagnosis not present

## 2015-05-14 MED FILL — HYDROCODONE-HOMATROPINE SYR: 5-1.5 | 4 days supply | Qty: 240 | Fill #0

## 2015-06-01 DIAGNOSIS — G4733 Obstructive sleep apnea (adult) (pediatric): Secondary | ICD-10-CM | POA: Diagnosis not present

## 2015-06-07 MED FILL — IRBESARTAN 300 MG TABLET: 300 | 90 days supply | Qty: 90 | Fill #0

## 2015-06-22 ENCOUNTER — Encounter: Payer: Self-pay | Admitting: Pharmacist

## 2015-06-22 DIAGNOSIS — R945 Abnormal results of liver function studies: Secondary | ICD-10-CM | POA: Diagnosis not present

## 2015-06-22 DIAGNOSIS — E784 Other hyperlipidemia: Secondary | ICD-10-CM | POA: Diagnosis not present

## 2015-07-02 DIAGNOSIS — G4733 Obstructive sleep apnea (adult) (pediatric): Secondary | ICD-10-CM | POA: Diagnosis not present

## 2015-07-03 ENCOUNTER — Ambulatory Visit (INDEPENDENT_AMBULATORY_CARE_PROVIDER_SITE_OTHER): Payer: 59 | Admitting: Cardiology

## 2015-07-03 ENCOUNTER — Encounter: Payer: Self-pay | Admitting: Cardiology

## 2015-07-03 VITALS — BP 160/92 | HR 64 | Ht 68.0 in | Wt 200.0 lb

## 2015-07-03 DIAGNOSIS — G4733 Obstructive sleep apnea (adult) (pediatric): Secondary | ICD-10-CM

## 2015-07-03 DIAGNOSIS — I10 Essential (primary) hypertension: Secondary | ICD-10-CM

## 2015-07-03 DIAGNOSIS — G4719 Other hypersomnia: Secondary | ICD-10-CM | POA: Diagnosis not present

## 2015-07-03 DIAGNOSIS — Z9989 Dependence on other enabling machines and devices: Secondary | ICD-10-CM

## 2015-07-03 HISTORY — DX: Other hypersomnia: G47.19

## 2015-07-03 MED ORDER — DOXAZOSIN MESYLATE 1 MG PO TABS: 1.0000 mg | ORAL_TABLET | Freq: Every day | ORAL | Status: DC

## 2015-07-03 MED FILL — ROSUVASTATIN CALCIUM 20 MG: 20 | 90 days supply | Qty: 90 | Fill #0

## 2015-07-03 MED FILL — DOXAZOSIN MESYLATE 1 MG TAB: 1 | 30 days supply | Qty: 30 | Fill #0

## 2015-07-03 MED FILL — VITAMIN D3 50000 UNIT CAPS: 1.25 MG | 84 days supply | Qty: 3 | Fill #0

## 2015-07-03 MED FILL — KLOR-CON M20 TABLET: 20 | 90 days supply | Qty: 90 | Fill #2

## 2015-07-03 MED FILL — VASCEPA 1 GM CAPSULE: 1 | 90 days supply | Qty: 360 | Fill #6

## 2015-07-03 NOTE — Progress Notes (Signed)
Cardiology Office Note    Date:  07/03/2015   ID:  ABDURRAHEEM AFIFI, DOB 05/28/1955, MRN [id number]  PCP:  Donnajean Lopes, MD  Cardiologist:  Sueanne Margarita, MD   Chief Complaint  Patient presents with  . Hypertension    History of Present Illness:  Alan Spencer is a 60 y.o. male who presents for followup of OSA. He has a history of severe OSA with an AHI of 40 events per hour mainly in the supine position and during NREM sleep. He is on BiPAP at 11cm H2O. He tolerates his BiPAP device well. He tolerates the nasal pillow mask and feels the pressure is adequate. He feels rested in the am if he gets a good night sleep.  He does not go to bed earlier and gets up too early and has not tried to change this. He takes Nuvigil PRN which helps. He goes to bed at MN-1am and gets up at [facility]. He has no mouth dryness but occasional nasal congestion.     Past Medical History  Diagnosis Date  . Essential hypertension, benign   . Mixed hyperlipidemia   . Anxiety   . Sleep apnea     uses CPAP  . HNP (herniated nucleus pulposus), lumbar     L4-L5  . Family history of colon cancer   . Rectal bleeding   . Excessive daytime sleepiness 07/03/2015    Past Surgical History  Procedure Laterality Date  . Tonsillectomy    . Broken left elbow      Current Medications: Outpatient Prescriptions Prior to Visit  Medication Sig Dispense Refill  . amLODipine (NORVASC) 10 MG tablet TAKE 1 TABLET BY MOUTH ONCE DAILY 30 tablet 0  . Armodafinil 150 MG tablet Take 150 mg by mouth as needed (sleep).     . carvedilol (COREG) 12.5 MG tablet Take 1 tablet (12.5 mg total) by mouth 2 (two) times daily with a meal. 180 tablet 3  . Cholecalciferol (VITAMIN D3) 50000 units CAPS Take 1 tablet by mouth once a week.  0  . furosemide (LASIX) 40 MG tablet TAKE 1 TABLET BY MOUTH ONCE DAILY. PLEASE CALL AND SCHEDULE APPT WITH DR. VARANASI 30 tablet 11  . irbesartan (AVAPRO) 300 MG tablet Take 1 tablet (300 mg  total) by mouth daily. 15 tablet 0  . Multiple Vitamins-Minerals (MULTIVITAMIN WITH MINERALS) tablet Take 1 tablet by mouth daily.    . potassium chloride SA (K-DUR,KLOR-CON) 20 MEQ tablet TAKE 1 TABLET BY MOUTH ONCE DAILY 30 tablet 0  . rosuvastatin (CRESTOR) 10 MG tablet Take 10 mg by mouth daily.    Marland Kitchen VASCEPA 1 g CAPS Take 4 tablets by mouth daily.  3   No facility-administered medications prior to visit.     Allergies:   Review of patient's allergies indicates no known allergies.   Social History   Social History  . Marital Status: Married    Spouse Name: N/A  . Number of Children: N/A  . Years of Education: N/A   Social History Main Topics  . Smoking status: Never Smoker   . Smokeless tobacco: None  . Alcohol Use: No  . Drug Use: No  . Sexual Activity: Not Asked   Other Topics Concern  . None   Social History Narrative     Family History:  The patient's family history includes Colon cancer in his paternal grandmother; Hypertension in his mother; Kidney failure in his father.   ROS:   Please see the history  of present illness.    Review of Systems  Constitution: Negative.  HENT: Negative.   Eyes: Negative.   Cardiovascular: Negative.   Respiratory: Negative.   Skin: Negative.   Musculoskeletal: Negative.   Gastrointestinal: Negative.   Genitourinary: Negative.   Neurological: Negative.   Psychiatric/Behavioral: Negative.    All other systems reviewed and are negative.   PHYSICAL EXAM:   VS:  BP 160/92 mmHg  Pulse 64  Ht 5\' 8"  (1.727 m)  Wt 200 lb (90.719 kg)  BMI 30.42 kg/m2   GEN: Well nourished, well developed, in no acute distress HEENT: normal Neck: no JVD, carotid bruits, or masses Cardiac: RRR; no murmurs, rubs, or gallops,no edema.  Intact distal pulses bilaterally.  Respiratory:  clear to auscultation bilaterally, normal work of breathing GI: soft, nontender, nondistended, + BS MS: no deformity or atrophy Skin: warm and dry, no rash Neuro:   Alert and Oriented x 3, Strength and sensation are intact Psych: euthymic mood, full affect  Wt Readings from Last 3 Encounters:  07/03/15 200 lb (90.719 kg)  03/22/15 196 lb 3.2 oz (88.996 kg)  07/28/14 192 lb 1.9 oz (87.145 kg)      Studies/Labs Reviewed:   EKG:  EKG is not ordered today.  Recent Labs: No results found for requested labs within last 365 days.   Lipid Panel No results found for: CHOL, TRIG, HDL, CHOLHDL, VLDL, LDLCALC, LDLDIRECT  Additional studies/ records that were reviewed today include:  CPAP d/l    ASSESSMENT:    1. Excessive daytime sleepiness   2. OSA on CPAP   3. Essential hypertension, benign      PLAN:  In order of problems listed above:  1. OSA - he is tolerating his BiPAP well. Hid d/l showed an AHI of 2.4/hr on BiPAP at 15/11cm H2O.  Patient has been using and benefiting from BiPAP use and will continue to benefit from therapy. He will continue on current settings. 2. HTN - BP borderline controlled. At home it averages 150-155/41mmHg.  Continue amlodipine/BB/ARB.  Cannot increase BB further due to borderline bradycardia.  I have encouraged him to follow a low sodium diet.  I am going to add Doxazosin 1mg  qhs.   3. Excessive daytime sleepiness.  This is improved on BiPAP as well as Armodafinil.    Medication Adjustments/Labs and Tests Ordered: Current medicines are reviewed at length with the patient today.  Concerns regarding medicines are outlined above.  Medication changes, Labs and Tests ordered today are listed in the Patient Instructions below. There are no Patient Instructions on file for this visit.   Lurena Nida, MD  07/03/2015 11:09 AM    [facility] [place], [place], [place]  [address] Phone: [phone]; Fax: [phone]

## 2015-07-03 NOTE — Addendum Note (Signed)
Addended by: Harland German A on: 07/03/2015 11:22 AM   Modules accepted: Orders

## 2015-07-03 NOTE — Patient Instructions (Addendum)
Medication Instructions:  Your physician has recommended you make the following change in your medication:  1) START DOXAZOSIN 1 mg NIGHTLY  Labwork: None  Testing/Procedures: None  Follow-Up: Your physician recommends that you schedule a follow-up appointment in: [facility] CLINIC in 2 weeks.  Your physician wants you to follow-up in: 1 year with Dr. Radford Pax. You will receive a reminder letter in the mail two months in advance. If you don't receive a letter, please call our office to schedule the follow-up appointment.   Any Other Special Instructions Will Be Listed Below (If Applicable).     If you need a refill on your cardiac medications before your next appointment, please call your pharmacy.  Doxazosin extended-release tablets What is this medicine? DOXAZOSIN (dox AY zoe sin) is an antihypertensive. It works by relaxing the blood vessels. It is used to treat benign prostatic hyperplasia (BPH) in men and to treat high blood pressure in both men and women. This medicine may be used for other purposes; ask your health care provider or pharmacist if you have questions. What should I tell my health care provider before I take this medicine? They need to know if you have any of the following conditions: -kidney or liver disease -low blood pressure -an unusual or allergic reaction to doxazosin, other medicines, foods, dyes, or preservatives -pregnant or trying to get pregnant -breast-feeding How should I use this medicine? Take this medicine by mouth with a glass of water. Follow the directions on the prescription label. Do not cut, crush, or chew. Take your doses at regular intervals. Do not take your medicine more often than directed. Do not stop taking except on the advice of your doctor or health care professional. Talk to your pediatrician regarding the use of this medicine in children. Special care may be needed. Overdosage: If you think you have taken too much of this medicine  contact a poison control center or emergency room at once. NOTE: This medicine is only for you. Do not share this medicine with others. What if I miss a dose? If you miss a dose, take it as soon as you can. If it is almost time for your next dose, take only that dose. Do not take double or extra doses. What may interact with this medicine? -cimetidine -medicines for colds or hay fever -medicines for overactive bladder -sildenafil -tadalafil -vardenafil This list may not describe all possible interactions. Give your health care provider a list of all the medicines, herbs, non-prescription drugs, or dietary supplements you use. Also tell them if you smoke, drink alcohol, or use illegal drugs. Some items may interact with your medicine. What should I watch for while using this medicine? Visit your doctor or health care professional for regular checks on your progress. Check your blood pressure regularly. Ask your doctor or health care professional what your blood pressure should be and when you should contact him or her. Drowsiness and dizziness are more likely to occur after the first dose, after an increase in dose, or during hot weather or exercise. These effects can decrease once your body adjusts to this medicine. Do not drive, use machinery, or do anything that needs mental alertness until you know how this drug affects you. Do not stand or sit up quickly, especially if you are an older patient. This reduces the risk of dizzy or fainting spells. Alcohol can make you more drowsy and dizzy. Avoid alcoholic drinks. Do not treat yourself for coughs, colds, or pain while you are  taking this medicine without asking your doctor or health care professional for advice. Some ingredients may increase your blood pressure. Your mouth may get dry. Chewing sugarless gum or sucking hard candy, and drinking plenty of water may help. Contact your doctor if the problem does not go away or is severe. For males,  contact your doctor or health care professional right away if you have an erection that lasts longer than 4 hours or if it becomes painful. This may be a sign of a serious problem and must be treated right away to prevent permanent damage. What side effects may I notice from receiving this medicine? Side effects that you should report to your doctor or health care professional as soon as possible: -allergic reactions like skin rash, itching or hives, swelling of the face, lips, or tongue -breathing problems -changes in vision -chest pain -fast or irregular heartbeat -feeling faint or lightheaded, falls -males: prolonged or painful erection -numbness in hands or feet -swelling of the legs or ankles -unusually weak or tired Side effects that usually do not require medical attention (report to your doctor or health care professional if they continue or are bothersome): -headache -nausea This list may not describe all possible side effects. Call your doctor for medical advice about side effects. You may report side effects to FDA at 1-800-FDA-1088. Where should I keep my medicine? Keep out of the reach of children. Store at room temperature between 15 and 30 degrees C (59 and 86 degrees F). Throw away any unused medicine after the expiration date. NOTE: This sheet is a summary. It may not cover all possible information. If you have questions about this medicine, talk to your doctor, pharmacist, or health care provider.    2016, [employer]. (2012-03-03 13:58:27)

## 2015-07-04 DIAGNOSIS — G4733 Obstructive sleep apnea (adult) (pediatric): Secondary | ICD-10-CM | POA: Diagnosis not present

## 2015-07-05 ENCOUNTER — Encounter: Payer: Self-pay | Admitting: Cardiology

## 2015-07-17 ENCOUNTER — Ambulatory Visit (INDEPENDENT_AMBULATORY_CARE_PROVIDER_SITE_OTHER): Payer: 59 | Admitting: Pharmacist

## 2015-07-17 VITALS — BP 168/66 | HR 65

## 2015-07-17 DIAGNOSIS — I10 Essential (primary) hypertension: Secondary | ICD-10-CM | POA: Diagnosis not present

## 2015-07-17 MED ORDER — EPLERENONE 25 MG PO TABS: 25.0000 mg | ORAL_TABLET | Freq: Every day | ORAL | Status: DC

## 2015-07-17 MED FILL — EPLERENONE 25 MG TABLET: 25 | 30 days supply | Qty: 30 | Fill #0

## 2015-07-17 NOTE — Progress Notes (Signed)
Patient ID: Alan Spencer                 DOB: February 13, 1956                      MRN: [id number]     HPI: Alan Spencer is a 60 y.o. male referred by Dr. Radford Pax to HTN clinic. PMH is significant for HTN, HLD, and OSA on CPAP. Patient reports that he has not been taking his furosemide for the past month due to running out of pills. He was started on it for some swelling in his legs that he states has gotten very slightly worse since discontinuing. He has a history of low potassium and has been on supplementation. He had been getting his potassium and irbesartan refills from his PCP, Dr. Philip Aspen with [facility].  He checks his BP a few times a week with reported readings 140-160s/70-80s, HR 50s. He has been having some orthostatic hypotension since the addition of his doxazosin a few weeks ago. He states that he used to be on spironolactone - looks like this was discontinued a few years ago due to ?hypercalcemia. Unsure if spironolactone related to hypercalcemia. Reviewed labs show calcium WNL at 10.4 from that time period. Pt also thinks he took HCTZ in the past and does not recall any issues taking it. May have been d/c'ed due to hx of low potassium. When pt took diuretics, he would take them before bed because he does not have much time during work to use the restroom. This would cause him to be up 4x per night. Since stopping his furosemide and since Dr. Radford Pax has helped with his CPAP, he reports his sleeping has been much better.  Pt expressed concern that BP medications may injure his kidneys (father has CKD). Discussed that BP control will help to prevent CKD.  Current HTN meds: amlodipine 10mg , carvedilol 12.5mg  BID, irbesartan 300mg  daily, doxazosin 1mg  (started 2 weeks ago), furosemide 40mg  daily (has not been taking for the past month) Previously tried: HCTZ, spironolactone BP goal: <140/31mmHg  Family History: HTN in his mother, CKD in his father.  Social History: Patient  has no history of smoking cigarettes, does not drink alcohol, and does not use illicit drugs.  Diet: Reviewed diet with patient, he does not use extra salt. Breakfast is typically oatmeal, eggs, or bagel. 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 Eats a lot of fish for dinner.  Exercise: Plays tennis 4 times per week.  Labs: [facility] to fax over recent labwork from last month 06/2015: SCr = 0.8, K = 4, Na = 143, Cl = 111, Ca = 10.2 (was on furosemide and K supplements, d/c'ed furosemide shortly after labwork was drawn)  TC 162, TG 280, HDL 36, LDL 70 (on fish oil 4g daily and Crestor 10mg  daily)   Wt Readings from Last 3 Encounters:  07/03/15 200 lb (90.719 kg)  03/22/15 196 lb 3.2 oz (88.996 kg)  07/28/14 192 lb 1.9 oz (87.145 kg)   BP Readings from Last 3 Encounters:  07/17/15 168/66  07/03/15 160/92  03/22/15 184/100   Pulse Readings from Last 3 Encounters:  07/17/15 65  07/03/15 64  03/22/15 67    Renal function: CrCl cannot be calculated (Unknown ideal weight.).  Past Medical History  Diagnosis Date  . Essential hypertension, benign   . Mixed hyperlipidemia   . Anxiety   . Sleep apnea     uses  CPAP  . HNP (herniated nucleus pulposus), lumbar     L4-L5  . Family history of colon cancer   . Rectal bleeding   . Excessive daytime sleepiness 07/03/2015    Current Outpatient Prescriptions on File Prior to Visit  Medication Sig Dispense Refill  . amLODipine (NORVASC) 10 MG tablet TAKE 1 TABLET BY MOUTH ONCE DAILY 30 tablet 0  . Armodafinil 150 MG tablet Take 150 mg by mouth as needed (sleep).     . carvedilol (COREG) 12.5 MG tablet Take 1 tablet (12.5 mg total) by mouth 2 (two) times daily with a meal. 180 tablet 3  . Cholecalciferol (VITAMIN D3) 50000 units CAPS Take 1 tablet by mouth every 30 (thirty) days.   0  . irbesartan (AVAPRO) 300 MG tablet Take 1 tablet (300 mg total) by mouth daily. 15 tablet 0  . Multiple Vitamins-Minerals  (MULTIVITAMIN WITH MINERALS) tablet Take 1 tablet by mouth daily.    . rosuvastatin (CRESTOR) 10 MG tablet Take 10 mg by mouth daily.    Marland Kitchen VASCEPA 1 g CAPS Take 4 tablets by mouth daily.  3   No current facility-administered medications on file prior to visit.    No Known Allergies   Assessment/Plan:  1. Hypertension - BP at 168/61mmHg above goal < 140/32mmHg. Will discontinue furosemide, potassium, and doxazosin since pt has not noticed any big difference with swelling, K is normal on BMET from last month, and pt had orthostasis with doxazosin/did not notice much improvement with BP. Will start eplerenone which will help with BP, swelling, and keeping K within range. Pt preferred this to spironolactone d/t less risk of gynecomastia. He will continue amlodipine, irbesartan, and carvedilol with no changes to these meds. Pt advised to bring in home BP cuff to next visit. He will call if eplerenone is too expensive, pt ok with changing to spironolactone if that is the case. Will f/u in 10 days with BP check and BMET, may need dose titration of eplerenone at that time.   Tam Savoia E. Tranise Forrest, PharmD, [facility] [id number], [place],  [address] Phone: [phone]; Fax: [phone] 07/17/2015 3:36 PM

## 2015-07-17 NOTE — Patient Instructions (Addendum)
Stop taking your furosemide, potassium, and doxazosin. Pick up eplerenone 25mg  to start taking once daily. Continue taking amlodipine, irbesartan, and carvedilol as you have been.  Check your blood pressure once a day and keep track of your readings. Bring in your blood pressure cuff with you to next visit in 10 days. We will check your blood pressure and labwork then.

## 2015-07-18 MED FILL — CARVEDILOL 12.5 MG TABLET: 12.5 | 90 days supply | Qty: 180 | Fill #1

## 2015-07-18 MED FILL — CHLORHEXIDINE 0.12% RINSE: 0.12 | 30 days supply | Qty: 473 | Fill #0

## 2015-07-20 MED FILL — AMOXICILLIN 500 MG CAPSULE: 500 | 7 days supply | Qty: 28 | Fill #0

## 2015-07-25 ENCOUNTER — Ambulatory Visit (INDEPENDENT_AMBULATORY_CARE_PROVIDER_SITE_OTHER): Payer: 59 | Admitting: Pharmacist

## 2015-07-25 ENCOUNTER — Encounter: Payer: Self-pay | Admitting: Pharmacist

## 2015-07-25 VITALS — BP 158/82 | HR 66

## 2015-07-25 DIAGNOSIS — I10 Essential (primary) hypertension: Secondary | ICD-10-CM | POA: Diagnosis not present

## 2015-07-25 LAB — BASIC METABOLIC PANEL
BUN: 17 mg/dL (ref 7–25)
CALCIUM: 10.1 mg/dL (ref 8.6–10.3)
CO2: 31 mmol/L (ref 20–31)
Chloride: 107 mmol/L (ref 98–110)
Creat: 0.93 mg/dL (ref 0.70–1.33)
GLUCOSE: 125 mg/dL — AB (ref 65–99)
Potassium: 3.7 mmol/L (ref 3.5–5.3)
SODIUM: 143 mmol/L (ref 135–146)

## 2015-07-25 NOTE — Patient Instructions (Signed)
Good to see you today!  We will follow up with you by phone once we have your lab results. Typically, this takes 24 hours. Once we have your results we can make further adjustments to your medications and future appointments.

## 2015-07-25 NOTE — Progress Notes (Signed)
Patient ID: Alan Spencer                 DOB: [birth year]                      MRN: [id number]     HPI: Alan Spencer is a 60 y.o. male referred by Dr. Radford Pax to HTN clinic. PMH is significant for HTN, HLD, and OSA on CPAP.  Presents for HTN follow up. Pt was seen last week in HTN clinic - at that time, Cardura, Lasix, and potassium were all discontinued and eplerenone 25mg  was initiated.Denies dizziness, lightheadedness, or other adverse effects. No issues obtaining medication. Home BP cuff brought to visit today. Home cuff reading in clinic: 160/78, very similar to clinic reading.   Current HTN meds: Amlodipine 10mg  daily, carvedilol 12.5mg  BID, eplerenone 25mg  daily, and irbesartan 300mg  daily.   Previously tried: HCTZ, spironolactone  BP goal: <140/66mmHg  Family History: HTN in his mother, CKD in his father.  Social History: Patient has no history of smoking cigarettes, does not drink alcohol, and does not use illicit drugs.  Diet: Does not use extra salt. Breakfast is typically oatmeal, eggs, or bagel. 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 Eats a lot of fish for dinner.  Exercise: Plays tennis 4 times per week.  Labs: Faxed from [facility]  06/2015: SCr = 0.8, K = 4, Na = 143, Cl = 111, Ca = 10.2 (was on furosemide and K supplements, d/c'ed furosemide shortly after labwork was drawn)  TC 162, TG 280, HDL 36, LDL 70 (on fish oil 4g daily and Crestor 10mg  daily)  Wt Readings from Last 3 Encounters:  07/03/15 200 lb (90.719 kg)  03/22/15 196 lb 3.2 oz (88.996 kg)  07/28/14 192 lb 1.9 oz (87.145 kg)   BP Readings from Last 3 Encounters:  07/25/15 158/82  07/17/15 168/66  07/03/15 160/92   Pulse Readings from Last 3 Encounters:  07/25/15 66  07/17/15 65  07/03/15 64    Renal function: CrCl cannot be calculated (Unknown ideal weight.).  Past Medical History  Diagnosis Date  . Essential hypertension, benign   . Mixed hyperlipidemia   . Anxiety   .  Sleep apnea     uses CPAP  . HNP (herniated nucleus pulposus), lumbar     L4-L5  . Family history of colon cancer   . Rectal bleeding   . Excessive daytime sleepiness 07/03/2015    Current Outpatient Prescriptions on File Prior to Visit  Medication Sig Dispense Refill  . amLODipine (NORVASC) 10 MG tablet TAKE 1 TABLET BY MOUTH ONCE DAILY 30 tablet 0  . Armodafinil 150 MG tablet Take 150 mg by mouth as needed (sleep).     . carvedilol (COREG) 12.5 MG tablet Take 1 tablet (12.5 mg total) by mouth 2 (two) times daily with a meal. 180 tablet 3  . Cholecalciferol (VITAMIN D3) 50000 units CAPS Take 1 tablet by mouth every 30 (thirty) days.   0  . eplerenone (INSPRA) 25 MG tablet Take 1 tablet (25 mg total) by mouth daily. 30 tablet 11  . irbesartan (AVAPRO) 300 MG tablet Take 1 tablet (300 mg total) by mouth daily. 15 tablet 0  . Multiple Vitamins-Minerals (MULTIVITAMIN WITH MINERALS) tablet Take 1 tablet by mouth daily.    . rosuvastatin (CRESTOR) 10 MG tablet Take 10 mg by mouth daily.    Marland Kitchen VASCEPA 1 g CAPS Take 4 tablets by mouth daily.  3   No current facility-administered medications on file prior to visit.    No Known Allergies   Assessment/Plan:  1.) Hypertension: BP above goal of <140/68mmHg. Reading in clinic today 158/82; with home BP cuff 160/78. BMET ordered for today to direct further medication titration. Will follow up with pt by phone once labs results are available. Plan to increase eplerenone to 50mg  daily if K is WNL.

## 2015-07-26 MED FILL — AMOXICILLIN 500 MG CAPSULE: 500 | 7 days supply | Qty: 28 | Fill #1

## 2015-08-01 DIAGNOSIS — G4733 Obstructive sleep apnea (adult) (pediatric): Secondary | ICD-10-CM | POA: Diagnosis not present

## 2015-08-14 DIAGNOSIS — I10 Essential (primary) hypertension: Secondary | ICD-10-CM | POA: Diagnosis not present

## 2015-08-15 MED FILL — AMLODIPINE BESYLATE 10 MG T: 10 | 90 days supply | Qty: 90 | Fill #0

## 2015-08-15 MED FILL — cloNIDine HCL 0.1 MG TABS: 0.1 | 90 days supply | Qty: 180 | Fill #0

## 2015-08-15 MED FILL — ARMODAFINIL 150 MG TABLET: 150 | 90 days supply | Qty: 90 | Fill #0

## 2015-09-10 DIAGNOSIS — I10 Essential (primary) hypertension: Secondary | ICD-10-CM | POA: Diagnosis not present

## 2015-09-10 MED FILL — predniSONE 20 MG TABS: 20 | 10 days supply | Qty: 13 | Fill #0

## 2015-09-10 MED FILL — CYCLOBENZAPRINE 10 MG TAB: 10 | 20 days supply | Qty: 60 | Fill #0

## 2015-09-12 ENCOUNTER — Other Ambulatory Visit (HOSPITAL_COMMUNITY): Payer: Self-pay | Admitting: Orthopedic Surgery

## 2015-09-12 DIAGNOSIS — M25562 Pain in left knee: Secondary | ICD-10-CM

## 2015-09-14 ENCOUNTER — Other Ambulatory Visit: Payer: Self-pay | Admitting: Internal Medicine

## 2015-09-14 DIAGNOSIS — M545 Low back pain, unspecified: Secondary | ICD-10-CM

## 2015-09-15 ENCOUNTER — Other Ambulatory Visit: Payer: Self-pay | Admitting: Internal Medicine

## 2015-09-15 ENCOUNTER — Emergency Department (HOSPITAL_COMMUNITY): Payer: 59

## 2015-09-15 ENCOUNTER — Encounter (HOSPITAL_COMMUNITY): Payer: Self-pay | Admitting: Emergency Medicine

## 2015-09-15 ENCOUNTER — Emergency Department (HOSPITAL_COMMUNITY)
Admission: EM | Admit: 2015-09-15 | Discharge: 2015-09-16 | Disposition: A | Discharge status: Home / Self Care | Payer: 59 | Attending: Emergency Medicine | Admitting: Emergency Medicine

## 2015-09-15 DIAGNOSIS — Y929 Unspecified place or not applicable: Secondary | ICD-10-CM | POA: Insufficient documentation

## 2015-09-15 DIAGNOSIS — X509XXA Other and unspecified overexertion or strenuous movements or postures, initial encounter: Secondary | ICD-10-CM | POA: Diagnosis not present

## 2015-09-15 DIAGNOSIS — M5116 Intervertebral disc disorders with radiculopathy, lumbar region: Secondary | ICD-10-CM | POA: Diagnosis not present

## 2015-09-15 DIAGNOSIS — R202 Paresthesia of skin: Secondary | ICD-10-CM | POA: Diagnosis not present

## 2015-09-15 DIAGNOSIS — M5106 Intervertebral disc disorders with myelopathy, lumbar region: Secondary | ICD-10-CM | POA: Diagnosis not present

## 2015-09-15 DIAGNOSIS — M545 Low back pain, unspecified: Secondary | ICD-10-CM

## 2015-09-15 DIAGNOSIS — Y9373 Activity, racquet and hand sports: Secondary | ICD-10-CM | POA: Diagnosis not present

## 2015-09-15 DIAGNOSIS — I10 Essential (primary) hypertension: Secondary | ICD-10-CM | POA: Diagnosis not present

## 2015-09-15 DIAGNOSIS — Z79899 Other long term (current) drug therapy: Secondary | ICD-10-CM | POA: Insufficient documentation

## 2015-09-15 DIAGNOSIS — M5124 Other intervertebral disc displacement, thoracic region: Secondary | ICD-10-CM | POA: Diagnosis not present

## 2015-09-15 DIAGNOSIS — M5416 Radiculopathy, lumbar region: Secondary | ICD-10-CM

## 2015-09-15 DIAGNOSIS — R200 Anesthesia of skin: Secondary | ICD-10-CM | POA: Insufficient documentation

## 2015-09-15 DIAGNOSIS — Y999 Unspecified external cause status: Secondary | ICD-10-CM | POA: Diagnosis not present

## 2015-09-15 DIAGNOSIS — G541 Lumbosacral plexus disorders: Secondary | ICD-10-CM | POA: Diagnosis not present

## 2015-09-15 LAB — BASIC METABOLIC PANEL
ANION GAP: 8 (ref 5–15)
BUN: 19 mg/dL (ref 6–20)
CO2: 29 mmol/L (ref 22–32)
Calcium: 10.3 mg/dL (ref 8.9–10.3)
Chloride: 103 mmol/L (ref 101–111)
Creatinine, Ser: 0.89 mg/dL (ref 0.61–1.24)
GFR calc Af Amer: >60 mL/min (ref >60)
GLUCOSE: 145 mg/dL — AB (ref 65–99)
POTASSIUM: 3.1 mmol/L — AB (ref 3.5–5.1)
Sodium: 140 mmol/L (ref 135–145)

## 2015-09-15 LAB — CBC WITH DIFFERENTIAL/PLATELET
BASOS ABS: 0 10*3/uL (ref 0.0–0.1)
BASOS PCT: 0 %
EOS ABS: 0 10*3/uL (ref 0.0–0.7)
EOS PCT: 0 %
HEMATOCRIT: 46.5 % (ref 39.0–52.0)
HEMOGLOBIN: 16.1 g/dL (ref 13.0–17.0)
Lymphocytes Relative: 9 %
Lymphs Abs: 1.3 10*3/uL (ref 0.7–4.0)
MCH: 29.2 pg (ref 26.0–34.0)
MCHC: 34.6 g/dL (ref 30.0–36.0)
MCV: 84.2 fL (ref 78.0–100.0)
MONO ABS: 0.5 10*3/uL (ref 0.1–1.0)
Monocytes Relative: 4 %
NEUTROS ABS: 11.7 10*3/uL — AB (ref 1.7–7.7)
Neutrophils Relative %: 87 %
Platelets: 229 10*3/uL (ref 150–400)
RBC: 5.52 MIL/uL (ref 4.22–5.81)
RDW: 13 % (ref 11.5–15.5)
WBC: 13.5 10*3/uL — ABNORMAL HIGH (ref 4.0–10.5)

## 2015-09-15 LAB — URINE MICROSCOPIC-ADD ON: RBC / HPF: NONE SEEN RBC/hpf (ref 0–5)

## 2015-09-15 LAB — URINALYSIS, ROUTINE W REFLEX MICROSCOPIC
Bilirubin Urine: NEGATIVE
GLUCOSE, UA: NEGATIVE mg/dL
Hgb urine dipstick: NEGATIVE
Ketones, ur: NEGATIVE mg/dL
LEUKOCYTES UA: NEGATIVE
Nitrite: NEGATIVE
PH: 7 (ref 5.0–8.0)
PROTEIN: 30 mg/dL — AB
Specific Gravity, Urine: 1.019 (ref 1.005–1.030)

## 2015-09-15 MED ORDER — DIAZEPAM 5 MG PO TABS: 5.0000 mg | ORAL_TABLET | Freq: Two times a day (BID) | ORAL | Status: DC

## 2015-09-15 MED ORDER — PREDNISONE 10 MG (21) PO TBPK: 10.0000 mg | ORAL_TABLET | Freq: Every day | ORAL | Status: DC

## 2015-09-15 MED ORDER — ONDANSETRON HCL 4 MG/2ML IJ SOLN
4.0000 mg | Freq: Once | INTRAMUSCULAR | Status: AC
  Administered 2015-09-15: 4 mg via INTRAVENOUS
  Filled 2015-09-15: qty 2

## 2015-09-15 MED ORDER — DEXAMETHASONE SODIUM PHOSPHATE 10 MG/ML IJ SOLN
10.0000 mg | Freq: Once | INTRAMUSCULAR | Status: AC
  Administered 2015-09-15: 10 mg via INTRAVENOUS
  Filled 2015-09-15: qty 1

## 2015-09-15 MED ORDER — HYDROMORPHONE HCL 1 MG/ML IJ SOLN
1.0000 mg | Freq: Once | INTRAMUSCULAR | Status: AC
Start: 2015-09-15 — End: 2015-09-15
  Administered 2015-09-15: 1 mg via INTRAVENOUS
  Filled 2015-09-15: qty 1

## 2015-09-15 MED ORDER — MORPHINE SULFATE (PF) 4 MG/ML IV SOLN
4.0000 mg | Freq: Once | INTRAVENOUS | Status: AC
  Administered 2015-09-15: 4 mg via INTRAVENOUS
  Filled 2015-09-15: qty 1

## 2015-09-15 MED ORDER — OXYCODONE-ACETAMINOPHEN 5-325 MG PO TABS: 1.0000 | ORAL_TABLET | ORAL | Status: DC | PRN

## 2015-09-15 NOTE — ED Notes (Signed)
Lab at the bedside 

## 2015-09-15 NOTE — ED Provider Notes (Signed)
CSN: [id number]     Arrival date & time 09/15/15  2028 History   First MD Initiated Contact with Patient 09/15/15 2105     Chief Complaint  Patient presents with  . Back Pain   PT IS A 60 YO WM WITH A HX OF PRIOR LUMBAR DISC DISEASE.  HE SAID THAT HE HAS HAD RIGHT LOWER BACK PAIN AND PAIN RADIATING DOWN RIGHT LEG.  PT SAID THAT IS STARTED WHEN HE WAS PLAYING TENNIS.  PT FEELS LIKE HE CAN'T CONTROL HIS RIGHT LEG.  HE IS TAKING PREDNISONE, HYDROCODONE, AND FLEXERIL WITHOUT HELP.  PT DENIES ACTUAL BLADDER INCONT, BUT SAID THAT IT FEELS LIKE IT MIGHT HAPPEN.  (Consider location/radiation/quality/duration/timing/severity/associated sxs/prior Treatment) Patient is a 60 y.o. male presenting with back pain. The history is provided by the patient.  Back Pain Location:  Lumbar spine Quality:  Stabbing Radiates to:  R posterior upper leg and R thigh Pain severity:  Severe Pain is:  Same all the time Onset quality:  Sudden Progression:  Worsening Chronicity:  New Worsened by:  Movement Associated symptoms: numbness     Past Medical History  Diagnosis Date  . Essential hypertension, benign   . Mixed hyperlipidemia   . Anxiety   . Sleep apnea     uses CPAP  . HNP (herniated nucleus pulposus), lumbar     L4-L5  . Family history of colon cancer   . Rectal bleeding   . Excessive daytime sleepiness 07/03/2015   Past Surgical History  Procedure Laterality Date  . Tonsillectomy    . Broken left elbow     Family History  Problem Relation Age of Onset  . Hypertension Mother   . Colon cancer Paternal Grandmother   . Kidney failure Father    Social History  Substance Use Topics  . Smoking status: Never Smoker   . Smokeless tobacco: None  . Alcohol Use: No    Review of Systems  Musculoskeletal: Positive for back pain.  Neurological: Positive for numbness.  All other systems reviewed and are negative.     Allergies  Review of patient's allergies indicates no known  allergies.  Home Medications   Prior to Admission medications   Medication Sig Start Date End Date Taking? Authorizing Provider  amLODipine (NORVASC) 10 MG tablet TAKE 1 TABLET BY MOUTH ONCE DAILY 03/29/15  Yes Jettie Booze, MD  Armodafinil 150 MG tablet Take 150 mg by mouth daily as needed (for wakefulness).  08/15/15  Yes Historical Provider, MD  Cholecalciferol (VITAMIN D3) 50000 units CAPS Take 1 tablet by mouth every 30 (thirty) days.  03/11/15  Yes Historical Provider, MD  cloNIDine (CATAPRES) 0.1 MG tablet Take 0.1 mg by mouth 3 (three) times daily. 08/28/15  Yes Historical Provider, MD  Coenzyme Q10 (COQ10 PO) Take 1 capsule by mouth daily.   Yes Historical Provider, MD  cyclobenzaprine (FLEXERIL) 10 MG tablet Take 10 mg by mouth 3 (three) times daily. 09/10/15  Yes Historical Provider, MD  glucosamine-chondroitin 500-400 MG tablet Take 1 tablet by mouth daily.   Yes Historical Provider, MD  Multiple Vitamins-Minerals (MULTIVITAMIN WITH MINERALS) tablet Take 1 tablet by mouth daily.   Yes Historical Provider, MD  rosuvastatin (CRESTOR) 10 MG tablet Take 10 mg by mouth every evening.    Yes Historical Provider, MD  VASCEPA 1 g CAPS Take 2 tablets by mouth 2 (two) times daily.  03/06/15  Yes Historical Provider, MD  carvedilol (COREG) 12.5 MG tablet Take 1 tablet (12.5 mg total)  by mouth 2 (two) times daily with a meal. 04/26/15   Sueanne Margarita, MD  diazepam (VALIUM) 5 MG tablet Take 1 tablet (5 mg total) by mouth 2 (two) times daily. 09/15/15   Isla Pence, MD  eplerenone (INSPRA) 25 MG tablet Take 1 tablet (25 mg total) by mouth daily. 07/17/15   Sueanne Margarita, MD  irbesartan (AVAPRO) 300 MG tablet Take 1 tablet (300 mg total) by mouth daily. 04/30/15   Jettie Booze, MD  oxyCODONE-acetaminophen (PERCOCET/ROXICET) 5-325 MG tablet Take 1 tablet by mouth every 4 (four) hours as needed for severe pain. 09/15/15   Isla Pence, MD  predniSONE (STERAPRED UNI-PAK 21 TAB) 10 MG (21) TBPK  tablet Take 1 tablet (10 mg total) by mouth daily. Take 6 tabs by mouth daily  for 2 days, then 5 tabs for 2 days, then 4 tabs for 2 days, then 3 tabs for 2 days, 2 tabs for 2 days, then 1 tab by mouth daily for 2 days 09/15/15   Isla Pence, MD   BP 180/91 mmHg  Pulse 73  Temp(Src) 97.8 F (36.6 C) (Oral)  Resp 18  Ht 5\' 8"  (1.727 m)  Wt 190 lb (86.183 kg)  BMI 28.90 kg/m2  SpO2 100% Physical Exam  Constitutional: He is oriented to person, place, and time. He appears well-developed and well-nourished.  HENT:  Head: Normocephalic and atraumatic.  Right Ear: External ear normal.  Left Ear: External ear normal.  Nose: Nose normal.  Mouth/Throat: Oropharynx is clear and moist.  Eyes: Conjunctivae and EOM are normal. Pupils are equal, round, and reactive to light.  Neck: Normal range of motion. Neck supple.  Cardiovascular: Normal rate, regular rhythm, normal heart sounds and intact distal pulses.   Pulmonary/Chest: Effort normal and breath sounds normal.  Abdominal: Soft. Bowel sounds are normal.  Musculoskeletal:       Lumbar back: He exhibits tenderness.  Neurological: He is alert and oriented to person, place, and time.  Skin: Skin is warm and dry.  Psychiatric: He has a normal mood and affect. His behavior is normal. Judgment and thought content normal.  Nursing note and vitals reviewed.   ED Course  Procedures (including critical care time) Labs Review Labs Reviewed  CBC WITH DIFFERENTIAL/PLATELET - Abnormal; Notable for the following:    WBC 13.5 (*)    Neutro Abs 11.7 (*)    All other components within normal limits  BASIC METABOLIC PANEL - Abnormal; Notable for the following:    Potassium 3.1 (*)    Glucose, Bld 145 (*)    All other components within normal limits  URINALYSIS, ROUTINE W REFLEX MICROSCOPIC (NOT AT [facility])    Imaging Review Mr Lumbar Spine Wo Contrast  09/15/2015  CLINICAL DATA:  Initial evaluation for acute right leg numbness. EXAM: MRI LUMBAR SPINE  WITHOUT CONTRAST TECHNIQUE: Multiplanar, multisequence MR imaging of the lumbar spine was performed. No intravenous contrast was administered. COMPARISON:  Prior MRI from 09/16/2007. FINDINGS: Segmentation: Normal segmentation. Lowest well-formed disc presumed to be the L5-S1 level. Alignment: Mild levoscoliosis. Vertebral bodies otherwise normally aligned with preservation of the normal lumbar lordosis. No listhesis or subluxation. Vertebrae: Vertebral body heights well preserved. No acute or chronic fracture. Signal intensity within the vertebral body bone marrow is normal. No focal osseous lesions. No marrow edema. Conus medullaris: Extends to the T12 level and appears normal. Nerve roots of the cauda equina are normal. Paraspinal and other soft tissues: Paraspinous soft tissues within normal limits. No  retroperitoneal adenopathy. Few scattered T2 hyperintense cyst noted within the partially visualized kidneys. Visualized visceral structures otherwise unremarkable. Disc levels: L1-2: Shallow foraminal disc bulges bilaterally without significant stenosis or neural impingement. L2-3:  Negative. L3-4: Fairly large right foraminal disc protrusion present (series 6, image 18). There is a probable superimposed extruded disc material extending laterally and superiorly within the right neural foramen (series 3, image 5). Superior migration extends for 8 mm superior to the parent L3-4 interspace. The herniated disc impinges upon the exiting right L3 nerve root (series 4, image 5). There is resultant moderate to severe right foraminal stenosis. Mild encroachment upon the right lateral recess as well, potentially affecting the descending right L4 nerve root (series 6, image 18). Superimposed mild facet and ligamentum flavum hypertrophy. Resultant mild canal stenosis. No significant left foraminal narrowing. L4-5: Mild diffuse disc bulge with degenerative intervertebral disc space narrowing and disc desiccation. Superimposed  shallow central/left subarticular disc protrusion with slight inferior migration (series 6, image 26). Mild encroachment upon the left lateral recess with mild lateral recess stenosis. Protruding disc appears to contact the transiting left L5 nerve root (series 6, image 26). Superimposed mild facet and ligamentum flavum hypertrophy. Resultant mild canal stenosis. Mild bilateral foraminal narrowing. L5-S1: No disc bulge or disc protrusion. Intervertebral disc is well hydrated. Mild bilateral facet arthrosis. No canal or foraminal stenosis. IMPRESSION: 1. Large right foraminal disc protrusion/extrusion at L3-4 with associated superior migration, impinging upon the exiting right L3 nerve root. Protruding disc potentially affects the right L4 nerve root as well an the right lateral recess. 2. Shallow left subarticular disc protrusion at L4-5, contacting the left L5 nerve root in the left lateral recess. 3. Mild disc bulging at L1-2 without stenosis or neural impingement. Electronically Signed   By: Jeannine Boga M.D.   On: 09/15/2015 22:50   I have personally reviewed and evaluated these images and lab results as part of my medical decision-making.   EKG Interpretation None      MDM  PT IS ABLE TO DORSIFLEX AND PLANTAR FLEX W/O PROBLEMS.  HE HAS NO URINARY RETENTION.  I SPOKE WITH DR. CRAM (NEUROSURGERY).  HE CAN SEE PT ON Monday.  PT WILL BE PUT ON MEDROL DOSE PACK AND PERCOCET.  PT KNOWS TO RETURN IF SX WORSEN.   Final diagnoses:  Right leg numbness  Lumbar nerve root impingement  Lumbar disc prolapse with compression radiculopathy       Isla Pence, MD 09/15/15 2314

## 2015-09-15 NOTE — ED Notes (Signed)
Undressing and attempting to use the urinal

## 2015-09-15 NOTE — ED Notes (Signed)
Pt wanting to know if he has to come in tomorrow for MRI, pt informed that since we did it tonight not necessary to return.

## 2015-09-15 NOTE — ED Notes (Signed)
Pt. reports worsening right lower back pain , right hip pain radiating to right leg with numbness , pt. stated he was playing tennis last thursday when pain got worse , pt. Is also concerned about bladder incontinence , pt. Is currently taking Hydrocodone , Prednisone and Flexeril .

## 2015-09-16 ENCOUNTER — Other Ambulatory Visit: Payer: 59

## 2015-09-17 DIAGNOSIS — M542 Cervicalgia: Secondary | ICD-10-CM | POA: Diagnosis not present

## 2015-09-17 DIAGNOSIS — I10 Essential (primary) hypertension: Secondary | ICD-10-CM | POA: Diagnosis not present

## 2015-09-17 MED FILL — OXYCODONE-APAP 10-325 TAB: 10-325 | 6 days supply | Qty: 40 | Fill #0

## 2015-09-17 MED FILL — FLUTICASONE PROP 50 MCG SPR: 50 | 90 days supply | Qty: 48 | Fill #0

## 2015-09-20 ENCOUNTER — Ambulatory Visit (HOSPITAL_COMMUNITY): Payer: 59

## 2015-09-21 ENCOUNTER — Other Ambulatory Visit: Payer: Self-pay | Admitting: Internal Medicine

## 2015-09-21 ENCOUNTER — Ambulatory Visit
Admission: RE | Admit: 2015-09-21 | Discharge: 2015-09-21 | Disposition: A | Discharge status: Home / Self Care | Payer: 59 | Source: Ambulatory Visit | Attending: Internal Medicine | Admitting: Internal Medicine

## 2015-09-21 DIAGNOSIS — M545 Low back pain, unspecified: Secondary | ICD-10-CM

## 2015-09-21 DIAGNOSIS — M5126 Other intervertebral disc displacement, lumbar region: Secondary | ICD-10-CM | POA: Diagnosis not present

## 2015-09-21 MED ORDER — METHYLPREDNISOLONE ACETATE 40 MG/ML INJ SUSP (RADIOLOG
120.0000 mg | Freq: Once | INTRAMUSCULAR | Status: AC
  Administered 2015-09-21: 120 mg via EPIDURAL

## 2015-09-21 MED ORDER — IOPAMIDOL (ISOVUE-M 200) INJECTION 41%
1.0000 mL | Freq: Once | INTRAMUSCULAR | Status: AC
  Administered 2015-09-21: 1 mL via EPIDURAL

## 2015-09-21 NOTE — Discharge Instructions (Signed)

## 2015-09-25 ENCOUNTER — Other Ambulatory Visit: Payer: 59

## 2015-09-27 DIAGNOSIS — I10 Essential (primary) hypertension: Secondary | ICD-10-CM | POA: Diagnosis not present

## 2015-09-28 MED FILL — VITAMIN D3 50000 UNIT CAPS: 1.25 MG | 84 days supply | Qty: 3 | Fill #1

## 2015-09-28 MED FILL — cloNIDine HCL 0.1 MG TABS: 0.1 | 90 days supply | Qty: 270 | Fill #0

## 2015-09-28 MED FILL — ROSUVASTATIN CALCIUM 20 MG: 20 | 90 days supply | Qty: 90 | Fill #1

## 2015-09-28 MED FILL — OXYCODONE/APAP 5/325MG: 5-325 | 13 days supply | Qty: 80 | Fill #0

## 2015-10-01 DIAGNOSIS — M5116 Intervertebral disc disorders with radiculopathy, lumbar region: Secondary | ICD-10-CM | POA: Diagnosis not present

## 2015-10-01 DIAGNOSIS — M5126 Other intervertebral disc displacement, lumbar region: Secondary | ICD-10-CM | POA: Diagnosis not present

## 2015-10-01 MED FILL — CYCLOBENZAPRINE 10 MG TAB: 10 | 20 days supply | Qty: 60 | Fill #0

## 2015-10-05 DIAGNOSIS — M545 Low back pain: Secondary | ICD-10-CM | POA: Diagnosis not present

## 2015-10-05 MED FILL — OXYCODONE/APAP 5/325MG: 5-325 | 5 days supply | Qty: 60 | Fill #0

## 2015-10-05 MED FILL — MOVIPREP POWDER KIT: 100 | 2 days supply | Qty: 1 | Fill #0

## 2015-10-23 MED FILL — AMLODIPINE BESYLATE 10 MG T: 10 | 90 days supply | Qty: 90 | Fill #1

## 2015-10-23 MED FILL — VASCEPA 1 GM CAPSULE: 1 | 90 days supply | Qty: 360 | Fill #0

## 2015-10-23 MED FILL — IRBESARTAN 300 MG TABLET: 300 | 90 days supply | Qty: 90 | Fill #1

## 2015-10-24 DIAGNOSIS — M5126 Other intervertebral disc displacement, lumbar region: Secondary | ICD-10-CM | POA: Diagnosis not present

## 2015-11-01 DIAGNOSIS — M5126 Other intervertebral disc displacement, lumbar region: Secondary | ICD-10-CM | POA: Diagnosis not present

## 2015-12-03 DIAGNOSIS — Z23 Encounter for immunization: Secondary | ICD-10-CM | POA: Diagnosis not present

## 2015-12-04 MED FILL — CARVEDILOL 25 MG TABLET: 25 | 90 days supply | Qty: 180 | Fill #0

## 2016-01-14 MED FILL — IRBESARTAN 300 MG TABLET: 300 | 90 days supply | Qty: 90 | Fill #2

## 2016-01-14 MED FILL — AMLODIPINE BESYLATE 10 MG T: 10 | 90 days supply | Qty: 90 | Fill #2

## 2016-01-14 MED FILL — ROSUVASTATIN CALCIUM 20 MG: 20 | 90 days supply | Qty: 90 | Fill #0

## 2016-02-06 MED FILL — VASCEPA 1 GM CAPSULE: 1 | 90 days supply | Qty: 360 | Fill #1

## 2016-02-08 MED FILL — ARMODAFINIL 150 MG TABLET: 150 | 90 days supply | Qty: 90 | Fill #0

## 2016-02-27 MED FILL — CARVEDILOL 25 MG TABLET: 25 | 90 days supply | Qty: 180 | Fill #1

## 2016-03-18 MED FILL — VITAMIN D3 50000 UNIT CAPS: 1.25 MG | 84 days supply | Qty: 3 | Fill #2

## 2016-03-18 MED FILL — AMOXICILLIN 500 MG CAPSULE: 500 | 8 days supply | Qty: 24 | Fill #0

## 2016-04-01 MED FILL — ROSUVASTATIN CALCIUM 20 MG: 20 | 90 days supply | Qty: 90 | Fill #1

## 2016-04-11 DIAGNOSIS — E784 Other hyperlipidemia: Secondary | ICD-10-CM | POA: Diagnosis not present

## 2016-04-15 DIAGNOSIS — E784 Other hyperlipidemia: Secondary | ICD-10-CM | POA: Diagnosis not present

## 2016-04-23 MED FILL — AMLODIPINE BESYLATE 10 MG T: 10 | 90 days supply | Qty: 90 | Fill #3

## 2016-05-05 MED FILL — IRBESARTAN 300 MG TABLET: 300 | 90 days supply | Qty: 90 | Fill #3

## 2016-05-05 MED FILL — FLUTICASONE PROP 50 MCG SPR: 50 | 90 days supply | Qty: 48 | Fill #1

## 2016-05-08 MED FILL — ARMODAFINIL 150 MG TABLET: 150 | 90 days supply | Qty: 90 | Fill #1

## 2016-05-12 MED FILL — VASCEPA 1 GM CAPSULE: 1 | 90 days supply | Qty: 360 | Fill #2

## 2016-05-29 MED FILL — CARVEDILOL 25 MG TABLET: 25 | 90 days supply | Qty: 180 | Fill #2

## 2016-07-02 MED FILL — VITAMIN D3 50000 UNIT CAPS: 1.25 MG | 84 days supply | Qty: 3 | Fill #3

## 2016-07-02 MED FILL — ROSUVASTATIN CALCIUM 20 MG: 20 | 90 days supply | Qty: 90 | Fill #2

## 2016-08-04 MED FILL — IRBESARTAN 300 MG TABLET: 300 | 90 days supply | Qty: 90 | Fill #0

## 2016-08-05 DIAGNOSIS — G4733 Obstructive sleep apnea (adult) (pediatric): Secondary | ICD-10-CM | POA: Diagnosis not present

## 2016-08-07 MED FILL — ARMODAFINIL 150 MG TABLET: 150 | 90 days supply | Qty: 90 | Fill #0

## 2016-08-13 MED FILL — VASCEPA 1 GM CAPSULE: 1 | 90 days supply | Qty: 360 | Fill #3

## 2016-08-19 MED FILL — predniSONE 20 MG TABS: 20 | 10 days supply | Qty: 10 | Fill #0

## 2016-08-21 DIAGNOSIS — Z7689 Persons encountering health services in other specified circumstances: Secondary | ICD-10-CM | POA: Diagnosis not present

## 2016-08-21 DIAGNOSIS — I10 Essential (primary) hypertension: Secondary | ICD-10-CM | POA: Diagnosis not present

## 2016-08-22 DIAGNOSIS — I10 Essential (primary) hypertension: Secondary | ICD-10-CM | POA: Diagnosis not present

## 2016-09-01 MED FILL — CARVEDILOL 25 MG TABLET: 25 | 90 days supply | Qty: 180 | Fill #0

## 2016-09-15 MED FILL — AMLODIPINE BESYLATE 10 MG T: 10 | 90 days supply | Qty: 90 | Fill #0

## 2016-10-15 MED FILL — ROSUVASTATIN CALCIUM 20 MG: 20 | 90 days supply | Qty: 90 | Fill #0

## 2016-10-15 MED FILL — VITAMIN D3 50000 UNIT CAPS: 1.25 MG | 12 days supply | Qty: 12 | Fill #0

## 2016-11-04 MED FILL — IRBESARTAN 300 MG TAB: 300 | 90 days supply | Qty: 90 | Fill #1

## 2016-11-04 MED FILL — FLUTICASONE PROP 50 MCG SPR: 50 | 90 days supply | Qty: 48 | Fill #0

## 2016-11-07 DIAGNOSIS — G4733 Obstructive sleep apnea (adult) (pediatric): Secondary | ICD-10-CM | POA: Diagnosis not present

## 2016-11-07 MED FILL — ARMODAFINIL 150 MG TABLET: 150 | 90 days supply | Qty: 90 | Fill #0

## 2016-11-10 DIAGNOSIS — H25013 Cortical age-related cataract, bilateral: Secondary | ICD-10-CM | POA: Diagnosis not present

## 2016-11-10 DIAGNOSIS — H2513 Age-related nuclear cataract, bilateral: Secondary | ICD-10-CM | POA: Diagnosis not present

## 2016-11-12 MED FILL — VASCEPA 1 GM CAPSULE: 1 | 90 days supply | Qty: 360 | Fill #0

## 2016-11-27 DIAGNOSIS — Z23 Encounter for immunization: Secondary | ICD-10-CM | POA: Diagnosis not present

## 2016-12-08 MED FILL — CARVEDILOL 25 MG TABLET: 25 | 90 days supply | Qty: 180 | Fill #1

## 2016-12-09 MED FILL — AMLODIPINE BESYLATE 10 MG T: 10 | 90 days supply | Qty: 90 | Fill #1

## 2017-01-15 MED FILL — ROSUVASTATIN CALCIUM 20 MG: 20 | 90 days supply | Qty: 90 | Fill #1

## 2017-01-29 MED FILL — IRBESARTAN 300 MG TAB: 300 | 90 days supply | Qty: 90 | Fill #2

## 2017-02-03 MED FILL — ARMODAFINIL 150 MG TABLET: 150 | 90 days supply | Qty: 90 | Fill #1

## 2017-02-03 MED FILL — VASCEPA 1 GM CAPSULE: 1 | 90 days supply | Qty: 360 | Fill #1

## 2017-03-25 MED FILL — AMLODIPINE BESYLATE 10 MG T: 10 | 90 days supply | Qty: 90 | Fill #2

## 2017-03-26 MED FILL — predniSONE 20 MG TABS: 20 | 8 days supply | Qty: 9 | Fill #0

## 2017-04-16 MED FILL — ROSUVASTATIN CALCIUM 20 MG: 20 | 90 days supply | Qty: 90 | Fill #2

## 2017-05-06 MED FILL — IRBESARTAN-HCTZ 300-12.5 MG: 300-12.5 | 90 days supply | Qty: 90 | Fill #0

## 2017-05-06 MED FILL — POTASSIUM CL ER 20 MEQ TABL: 20 | 90 days supply | Qty: 90 | Fill #0

## 2017-05-11 MED FILL — VASCEPA 1 GM CAPSULE: 1 | 90 days supply | Qty: 360 | Fill #2

## 2017-05-12 MED FILL — FLUTICASONE PROP 50 MCG SPR: 50 | 90 days supply | Qty: 48 | Fill #1

## 2017-05-12 MED FILL — ARMODAFINIL 150 MG TABLET: 150 | 60 days supply | Qty: 60 | Fill #0

## 2017-05-20 ENCOUNTER — Telehealth: Payer: Self-pay

## 2017-05-20 NOTE — Telephone Encounter (Signed)
SENT REFERRAL TO Alan Spencer [phone]

## 2017-05-21 MED FILL — CARVEDILOL 25 MG TABS: 25 | 90 days supply | Qty: 180 | Fill #0

## 2017-06-30 MED FILL — AMLODIPINE BESYLATE 10 MG T: 10 | 90 days supply | Qty: 90 | Fill #0

## 2017-07-24 MED FILL — ROSUVASTATIN CALCIUM 20 MG: 20 | 90 days supply | Qty: 90 | Fill #0

## 2017-08-04 MED FILL — IRBESARTAN-HCTZ 300-12.5 MG: 300-12.5 | 30 days supply | Qty: 30 | Fill #1

## 2017-08-11 MED FILL — VASCEPA 1 GM CAPSULE: 1 | 90 days supply | Qty: 360 | Fill #3

## 2017-08-11 MED FILL — FLUTICASONE PROP 50 MCG SPR: 50 | 90 days supply | Qty: 48 | Fill #2

## 2017-08-20 ENCOUNTER — Encounter: Payer: Self-pay | Admitting: Podiatry

## 2017-08-20 ENCOUNTER — Ambulatory Visit: Payer: No Typology Code available for payment source | Admitting: Podiatry

## 2017-08-20 VITALS — BP 175/85 | HR 49

## 2017-08-20 DIAGNOSIS — L600 Ingrowing nail: Secondary | ICD-10-CM | POA: Diagnosis not present

## 2017-08-20 NOTE — Patient Instructions (Signed)

## 2017-08-23 NOTE — Progress Notes (Signed)
Subjective:   Patient ID: Alan Spencer, male   DOB: 62 y.o.   MRN: [id number]   HPI Patient presents stating his nail has been very sore he is been on antibiotics which have helped some with redness but the pain of the ingrown toenail has been present and is been going on for a number of weeks.  Patient does not smoke and likes to be active   Review of Systems  All other systems reviewed and are negative.       Objective:  Physical Exam  Constitutional: He appears well-developed and well-nourished.  Cardiovascular: Intact distal pulses.  Pulmonary/Chest: Effort normal.  Musculoskeletal: Normal range of motion.  Neurological: He is alert.  Skin: Skin is warm.  Nursing note and vitals reviewed.   Neurovascular status was found to be intact muscle strength adequate range of motion within normal limits with patient found to have a inflamed left hallux nail lateral border that is incurvated with localized redness but no proximal edema erythema or drainage noted.  Patient is noted to have good digital perfusion well oriented x3     Assessment:  Ingrown toenail deformity left hallux lateral border with pain     Plan:  H&P condition reviewed and recommended correction.  Explained procedure and risk and patient signed consent form and today I infiltrated the left hallux 60 mg like Marcaine mixture after sterile prep of the toe I did remove the lateral border exposed matrix and applied phenol 3 applications 30 seconds followed by alcohol lavage and sterile dressing.  Gave instructions on soaks and reappoint as needed

## 2017-09-01 MED FILL — IRBESARTAN-HCTZ 300-12.5 MG: 300-12.5 | 30 days supply | Qty: 30 | Fill #2

## 2017-10-01 ENCOUNTER — Encounter: Payer: Self-pay | Admitting: Podiatry

## 2017-10-01 ENCOUNTER — Ambulatory Visit: Payer: No Typology Code available for payment source | Admitting: Podiatry

## 2017-10-01 DIAGNOSIS — L03032 Cellulitis of left toe: Secondary | ICD-10-CM | POA: Diagnosis not present

## 2017-10-01 NOTE — Patient Instructions (Signed)

## 2017-10-01 NOTE — Progress Notes (Signed)
Subjective:   Patient ID: Alan Spencer, male   DOB: 62 y.o.   MRN: [id number]   HPI Patient presents with a left hallux nail showing redness in the lateral side that just started in the last week.  Patient is very active and does play tennis and was doing very well from ingrown toenail correction   ROS      Objective:  Physical Exam  Paronychia infection of the left hallux lateral border that appears to be localized     Assessment:  There is redness in the area with slight proximal extension but it does not extend to the interphalangeal joint     Plan:  Advised on removal of the border of the nail and cleaned that out and I infiltrated the left hallux 60 minutes of Marcaine mixture using sterile expectation I reviewed the small spicule of nail I flushed the area out I did not note any drainage to culture and I applied sterile dressing instructed on soaks and if any redness were to occur further we will start him on an antibiotic and he is advised to call us

## 2017-10-05 MED FILL — CARVEDILOL 25 MG TABLET: 25 | 90 days supply | Qty: 180 | Fill #1

## 2017-10-05 MED FILL — AMLODIPINE BESYLATE 10 MG T: 10 | 90 days supply | Qty: 90 | Fill #1

## 2017-10-05 MED FILL — IRBESARTAN-HCTZ 300-12.5 MG: 300-12.5 | 30 days supply | Qty: 30 | Fill #3

## 2017-10-21 MED FILL — POTASSIUM CL ER 20 MEQ TABL: 20 | 90 days supply | Qty: 90 | Fill #1

## 2017-10-21 MED FILL — ROSUVASTATIN CALCIUM 20 MG: 20 | 90 days supply | Qty: 90 | Fill #1

## 2017-10-27 MED FILL — IRBESARTAN-HCTZ 300-12.5 MG: 300-12.5 | 30 days supply | Qty: 30 | Fill #4

## 2017-11-03 MED FILL — VASCEPA 1 GM CAPSULE: 1 | 90 days supply | Qty: 360 | Fill #4

## 2017-12-07 MED FILL — IRBESARTAN-HCTZ 300-12.5 MG: 300-12.5 | 30 days supply | Qty: 30 | Fill #5

## 2017-12-29 MED FILL — IRBESARTAN-HCTZ 300-12.5 MG: 300-12.5 | 30 days supply | Qty: 30 | Fill #6

## 2017-12-29 MED FILL — AMLODIPINE BESYLATE 10 MG T: 10 | 90 days supply | Qty: 90 | Fill #0

## 2017-12-29 MED FILL — CARVEDILOL 25 MG TABLET: 25 | 90 days supply | Qty: 180 | Fill #2

## 2018-01-08 ENCOUNTER — Ambulatory Visit: Payer: No Typology Code available for payment source | Admitting: Cardiology

## 2018-01-08 ENCOUNTER — Encounter: Payer: Self-pay | Admitting: Cardiology

## 2018-01-08 VITALS — BP 164/88 | HR 61 | Ht 68.0 in | Wt 200.0 lb

## 2018-01-08 DIAGNOSIS — G4733 Obstructive sleep apnea (adult) (pediatric): Secondary | ICD-10-CM

## 2018-01-08 DIAGNOSIS — R0981 Nasal congestion: Secondary | ICD-10-CM | POA: Diagnosis not present

## 2018-01-08 DIAGNOSIS — I10 Essential (primary) hypertension: Secondary | ICD-10-CM

## 2018-01-08 DIAGNOSIS — E669 Obesity, unspecified: Secondary | ICD-10-CM

## 2018-01-08 DIAGNOSIS — Z9989 Dependence on other enabling machines and devices: Secondary | ICD-10-CM

## 2018-01-08 NOTE — Patient Instructions (Signed)
Medication Instructions:  Your physician recommends that you continue on your current medications as directed. Please refer to the Current Medication list given to you today.  If you need a refill on your cardiac medications before your next appointment, please call your pharmacy.   Lab work:  If you have labs (blood work) drawn today and your tests are completely normal, you will receive your results only by: Marland Kitchen MyChart Message (if you have MyChart) OR . A paper copy in the mail If you have any lab test that is abnormal or we need to change your treatment, we will call you to review the results.  Follow-Up:  You have been referred to ENT for Nasal Congestion   At [facility], you and your health needs are our priority.  As part of our continuing mission to provide you with exceptional heart care, we have created designated Provider Care Teams.  These Care Teams include your primary Cardiologist (physician) and Advanced Practice Providers (APPs -  Physician Assistants and Nurse Practitioners) who all work together to provide you with the care you need, when you need it. You will need a follow up appointment in 1 years.  Please call our office 2 months in advance to schedule this appointment.  You may see Dr. Radford Pax or one of the following Advanced Practice Providers on your designated Care Team:   [place], PA-C Melina Copa, PA-C . Ermalinda Barrios, PA-C

## 2018-01-10 ENCOUNTER — Encounter: Payer: Self-pay | Admitting: Cardiology

## 2018-01-10 DIAGNOSIS — E669 Obesity, unspecified: Secondary | ICD-10-CM

## 2018-01-10 HISTORY — DX: Obesity, unspecified: E66.9

## 2018-01-10 NOTE — Progress Notes (Addendum)
Cardiology Office Note:    Date:  01/13/2018   ID:  Alan Spencer, DOB 1956/01/12, MRN [id number]  PCP:  Leanna Battles, MD  Cardiologist:  No primary care provider on file.    Referring MD: Leanna Battles, MD   Chief Complaint  Patient presents with  . Sleep Apnea    History of Present Illness:    Alan Spencer is a 62 y.o. male with a hx of severe OSA with an AHI of 40 events per hour mainly in the supine position and during NREM sleep. He is on BiPAP at 11cm H2O.  He is doing well with his CPAP device.    He tolerates the mask and feels the pressure is adequate.  He still has some problems with fatigue.  He goes to bed at 12 MN and gets up at [place]. He denies any significant mouth or nasal dryness or nasal congestion.  He does not think that he snores.     Past Medical History:  Diagnosis Date  . Anxiety   . Essential hypertension, benign   . Excessive daytime sleepiness 07/03/2015  . Family history of colon cancer   . HNP (herniated nucleus pulposus), lumbar    L4-L5  . Mixed hyperlipidemia   . Obesity (BMI 30-39.9) 01/10/2018  . Rectal bleeding   . Sleep apnea    uses CPAP    Past Surgical History:  Procedure Laterality Date  . broken left elbow    . TONSILLECTOMY      Current Medications: Current Meds  Medication Sig  . amLODipine (NORVASC) 10 MG tablet TAKE 1 TABLET BY MOUTH ONCE DAILY  . Armodafinil 150 MG tablet Take 150 mg by mouth daily as needed (for wakefulness).   . carvedilol (COREG) 25 MG tablet Take 25 mg by mouth 2 (two) times daily.  . Cholecalciferol (VITAMIN D3) 50000 units CAPS Take 1 tablet by mouth every 30 (thirty) days.   . Coenzyme Q10 (COQ10 PO) Take 1 capsule by mouth daily.  . fluticasone (FLONASE) 50 MCG/ACT nasal spray   . glucosamine-chondroitin 500-400 MG tablet Take 1 tablet by mouth daily.  . irbesartan-hydrochlorothiazide (AVALIDE) 300-12.5 MG tablet   . Multiple Vitamins-Minerals (MULTIVITAMIN WITH MINERALS) tablet Take 1  tablet by mouth daily.  . potassium chloride SA (K-DUR,KLOR-CON) 20 MEQ tablet Take 20 mEq by mouth daily.  . rosuvastatin (CRESTOR) 20 MG tablet Take 20 mg by mouth daily.  Marland Kitchen VASCEPA 1 g CAPS Take 2 tablets by mouth 2 (two) times daily.      Allergies:   Patient has no known allergies.   Social History   Socioeconomic History  . Marital status: Married    Spouse name: Not on file  . Number of children: Not on file  . Years of education: Not on file  . Highest education level: Not on file  Occupational History  . Not on file  Social Needs  . Financial resource strain: Not on file  . Food insecurity:    Worry: Not on file    Inability: Not on file  . Transportation needs:    Medical: Not on file    Non-medical: Not on file  Tobacco Use  . Smoking status: Never Smoker  . Smokeless tobacco: Never Used  Substance and Sexual Activity  . Alcohol use: No  . Drug use: No  . Sexual activity: Not on file  Lifestyle  . Physical activity:    Days per week: Not on file    Minutes  per session: Not on file  . Stress: Not on file  Relationships  . Social connections:    Talks on phone: Not on file    Gets together: Not on file    Attends religious service: Not on file    Active member of club or organization: Not on file    Attends meetings of clubs or organizations: Not on file    Relationship status: Not on file  Other Topics Concern  . Not on file  Social History Narrative  . Not on file     Family History: The patient's family history includes Colon cancer in his paternal grandmother; Hypertension in his mother; Kidney failure in his father.  ROS:   Please see the history of present illness.    ROS  All other systems reviewed and negative.   EKGs/Labs/Other Studies Reviewed:    The following studies were reviewed today: PAP download  EKG:  EKG is not ordered today.    Recent Labs: No results found for requested labs within last 8760 hours.   Recent Lipid  Panel No results found for: CHOL, TRIG, HDL, CHOLHDL, VLDL, LDLCALC, LDLDIRECT  Physical Exam:    VS:  BP (!) 164/88   Pulse 61   Ht 5\' 8"  (1.727 m)   Wt 200 lb (90.7 kg)   SpO2 98%   BMI 30.41 kg/m     Wt Readings from Last 3 Encounters:  01/08/18 200 lb (90.7 kg)  09/15/15 190 lb (86.2 kg)  07/03/15 200 lb (90.7 kg)     GEN:  Well nourished, well developed in no acute distress HEENT: Normal NECK: No JVD; No carotid bruits LYMPHATICS: No lymphadenopathy CARDIAC: RRR, no murmurs, rubs, gallops RESPIRATORY:  Clear to auscultation without rales, wheezing or rhonchi  ABDOMEN: Soft, non-tender, non-distended MUSCULOSKELETAL:  No edema; No deformity  SKIN: Warm and dry NEUROLOGIC:  Alert and oriented x 3 PSYCHIATRIC:  Normal affect   ASSESSMENT:    1. OSA on CPAP   2. Nasal congestion   3. Essential hypertension, benign   4. Obesity (BMI 30-39.9)    PLAN:    In order of problems listed above:  1.  OSA - the patient is tolerating PAP therapy well without any problems. The PAP download was reviewed today and showed an AHI of 2/hr on 15/11 cm H2O with 100% compliance in using more than 4 hours nightly.  The patient has been using and benefiting from PAP use and will continue to benefit from therapy. I suspect that his ongoing fatigue is more likely related to poor sleep hygiene and I encouraged him to try to get at least 7-8 hours of sleep nightly.   2.  Nasal congestion - he is complaining of significant nasal congestion.  He says that saline nasal spray does not help and he has been using a lot of Afrin.  I will refer him to ENT for evaluation. I encouraged him to try to adjust his humidity.    3.  HTN - BP is borderline controlled on exam today. He says at home it is much better.   He will continue on amlodipine 10mg  daily, carvedilol 25mg  BID and Irbesartan-HCT 300-12.5mg  daily.    4.  Obesity - I have encouraged him to get into a routine exercise program and cut back on  carbs and portions.    Medication Adjustments/Labs and Tests Ordered: Current medicines are reviewed at length with the patient today.  Concerns regarding medicines are outlined above.  Orders Placed This Encounter  Procedures  . Ambulatory referral to ENT   No orders of the defined types were placed in this encounter.   Signed, Fransico Him, MD  01/13/2018 10:40 PM    [place]

## 2018-01-11 MED FILL — ARMODAFINIL 150 MG TABLET: 150 | 90 days supply | Qty: 90 | Fill #0

## 2018-01-11 MED FILL — predniSONE 20 MG TABS: 20 | 9 days supply | Qty: 15 | Fill #0

## 2018-01-11 MED FILL — METOPROLOL SUCCINATE ER 100: 100 | 90 days supply | Qty: 90 | Fill #0

## 2018-01-11 MED FILL — ROSUVASTATIN CALCIUM 20 MG: 20 | 90 days supply | Qty: 90 | Fill #0

## 2018-01-12 MED FILL — POTASSIUM CL ER 20 MEQ TABL: 20 | 90 days supply | Qty: 90 | Fill #2

## 2018-02-05 MED FILL — IRBESARTAN-HCTZ 300-12.5 MG: 300-12.5 | 30 days supply | Qty: 30 | Fill #7

## 2018-02-12 MED FILL — VASCEPA 1 GM CAPSULE: 1 | 90 days supply | Qty: 360 | Fill #0

## 2018-02-25 MED FILL — IMIQUIMOD 5 % CREA: 5 | 30 days supply | Qty: 15 | Fill #0

## 2018-03-04 MED FILL — IRBESARTAN-HCTZ 300-12.5 MG: 300-12.5 | 30 days supply | Qty: 30 | Fill #8

## 2018-03-06 ENCOUNTER — Ambulatory Visit (INDEPENDENT_AMBULATORY_CARE_PROVIDER_SITE_OTHER): Payer: No Typology Code available for payment source

## 2018-03-06 ENCOUNTER — Ambulatory Visit (INDEPENDENT_AMBULATORY_CARE_PROVIDER_SITE_OTHER): Payer: No Typology Code available for payment source | Admitting: Podiatry

## 2018-03-06 DIAGNOSIS — D3610 Benign neoplasm of peripheral nerves and autonomic nervous system, unspecified: Secondary | ICD-10-CM

## 2018-03-06 DIAGNOSIS — G5761 Lesion of plantar nerve, right lower limb: Secondary | ICD-10-CM

## 2018-03-06 DIAGNOSIS — M79671 Pain in right foot: Secondary | ICD-10-CM

## 2018-03-13 NOTE — Progress Notes (Signed)
   HPI: 62 year old male presenting today with a chief complaint of sharp, tingling pain between the third and fourth toes of the right foot that began gradually two months ago. He reports associated pain to the ball of the foot. Walking increases his symptoms. He has been taking Aleve for treatment which was helping but now is not providing much relief. Patient is here for further evaluation and treatment.   Past Medical History:  Diagnosis Date  . Anxiety   . Essential hypertension, benign   . Excessive daytime sleepiness 07/03/2015  . Family history of colon cancer   . HNP (herniated nucleus pulposus), lumbar    L4-L5  . Mixed hyperlipidemia   . Obesity (BMI 30-39.9) 01/10/2018  . Rectal bleeding   . Sleep apnea    uses CPAP     Physical Exam: General: The patient is alert and oriented x3 in no acute distress.  Dermatology: Skin is warm, dry and supple bilateral lower extremities. Negative for open lesions or macerations.  Vascular: Palpable pedal pulses bilaterally. No edema or erythema noted. Capillary refill within normal limits.  Neurological: Epicritic and protective threshold grossly intact bilaterally.   Musculoskeletal Exam: Sharp pain with palpation of the 3rd interspace and lateral compression of the metatarsal heads consistent with neuroma.  Positive Conley Canal sign with loadbearing of the forefoot.  Radiographic Exam:  Normal osseous mineralization. Joint spaces preserved. No fracture/dislocation/boney destruction.    Assessment: 1.  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 Patient was evaluated. X-Rays reviewed.  2. Injection of 0.5 mLs Celestone Soluspan injected into the 3rd interspace of the right foot.  3. Offloading met pads dispensed.  4. Recommended good shoe gear.  5. Return to clinic in 4 weeks.    Edrick Kins, DPM [facility]  Dr. Edrick Kins, [place]                                         [place], [place] [address]                Office [phone]  Fax [phone]

## 2018-03-19 MED FILL — IMIQUIMOD 5 % CREA: 5 | 30 days supply | Qty: 15 | Fill #1

## 2018-04-04 MED FILL — AMLODIPINE BESYLATE 10 MG T: 10 | 90 days supply | Qty: 90 | Fill #1

## 2018-04-04 MED FILL — IRBESARTAN-HCTZ 300-12.5 MG: 300-12.5 | 30 days supply | Qty: 30 | Fill #9

## 2018-04-07 ENCOUNTER — Encounter: Payer: Self-pay | Admitting: Podiatry

## 2018-04-07 ENCOUNTER — Ambulatory Visit: Payer: No Typology Code available for payment source | Admitting: Podiatry

## 2018-04-07 DIAGNOSIS — M79671 Pain in right foot: Secondary | ICD-10-CM

## 2018-04-07 DIAGNOSIS — G5761 Lesion of plantar nerve, right lower limb: Secondary | ICD-10-CM | POA: Diagnosis not present

## 2018-04-12 MED FILL — ARMODAFINIL 150 MG TABLET: 150 | 90 days supply | Qty: 90 | Fill #1

## 2018-04-12 MED FILL — METOPROLOL SUCCINATE ER 100: 100 | 30 days supply | Qty: 30 | Fill #1

## 2018-04-13 MED FILL — POTASSIUM CHLORIDE CRYS ER: 20 | 90 days supply | Qty: 90 | Fill #0

## 2018-04-18 NOTE — Progress Notes (Signed)
   HPI: 63 year old male presenting today for follow up evaluation of a neuroma of the third interspace of the right foot. He states the pain has improved but is still present. He reports some relief from the last injection he received and would like another. There are no modifying factors noted. Patient is here for further evaluation and treatment.   Past Medical History:  Diagnosis Date  . Anxiety   . Essential hypertension, benign   . Excessive daytime sleepiness 07/03/2015  . Family history of colon cancer   . HNP (herniated nucleus pulposus), lumbar    L4-L5  . Mixed hyperlipidemia   . Obesity (BMI 30-39.9) 01/10/2018  . Rectal bleeding   . Sleep apnea    uses CPAP     Physical Exam: General: The patient is alert and oriented x3 in no acute distress.  Dermatology: Skin is warm, dry and supple bilateral lower extremities. Negative for open lesions or macerations.  Vascular: Palpable pedal pulses bilaterally. No edema or erythema noted. Capillary refill within normal limits.  Neurological: Epicritic and protective threshold grossly intact bilaterally.   Musculoskeletal Exam: Sharp pain with palpation of the 3rd interspace and lateral compression of the metatarsal heads consistent with neuroma.  Positive Conley Canal sign with loadbearing of the forefoot.  Assessment: 1.  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 Patient was evaluated.  2. Injection of 0.5 mLs Celestone Soluspan injected into the 3rd interspace of the right foot.  3. Recommended good shoe gear.  4. We did discuss possible alcohol sclerosing injections as an option.  5. Return to clinic in 4 weeks.     Edrick Kins, DPM [facility]  Dr. Edrick Kins, [address]                                        [place], [place] [address]                Office [phone]  Fax [phone]

## 2018-04-28 MED FILL — ROSUVASTATIN CALCIUM 20 MG: 20 | 90 days supply | Qty: 90 | Fill #1

## 2018-04-28 MED FILL — IMIQUIMOD 5 % CREA: 5 | 30 days supply | Qty: 15 | Fill #2

## 2018-05-03 MED FILL — VASCEPA 1 GM CAPSULE: 1 | 90 days supply | Qty: 360 | Fill #1

## 2018-05-03 MED FILL — IRBESARTAN-HCTZ 300-12.5 MG: 300-12.5 | 90 days supply | Qty: 90 | Fill #0

## 2018-05-05 ENCOUNTER — Encounter: Payer: Self-pay | Admitting: Podiatry

## 2018-05-05 ENCOUNTER — Ambulatory Visit: Payer: No Typology Code available for payment source | Admitting: Podiatry

## 2018-05-05 DIAGNOSIS — G5761 Lesion of plantar nerve, right lower limb: Secondary | ICD-10-CM

## 2018-05-05 MED FILL — ARMODAFINIL 250 MG TABLET: 250 | 90 days supply | Qty: 90 | Fill #0

## 2018-05-09 NOTE — Progress Notes (Signed)
   HPI: 63 year old male presenting today for follow up evaluation of a neuroma of the third interspace of the right foot. He states his pain is still present but has improved since before his last visit. He states the injection he received last time helped alleviate the pain. There are no modifying factors noted. Patient is here for further evaluation and treatment.   Past Medical History:  Diagnosis Date  . Anxiety   . Essential hypertension, benign   . Excessive daytime sleepiness 07/03/2015  . Family history of colon cancer   . HNP (herniated nucleus pulposus), lumbar    L4-L5  . Mixed hyperlipidemia   . Obesity (BMI 30-39.9) 01/10/2018  . Rectal bleeding   . Sleep apnea    uses CPAP     Physical Exam: General: The patient is alert and oriented x3 in no acute distress.  Dermatology: Skin is warm, dry and supple bilateral lower extremities. Negative for open lesions or macerations.  Vascular: Palpable pedal pulses bilaterally. No edema or erythema noted. Capillary refill within normal limits.  Neurological: Epicritic and protective threshold grossly intact bilaterally.   Musculoskeletal Exam: Sharp pain with palpation of the 3rd interspace and lateral compression of the metatarsal heads consistent with neuroma.  Positive Conley Canal sign with loadbearing of the forefoot.  Assessment: 1.  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 Patient was evaluated.  2. Injection of 0.5 mLs Celestone Soluspan injected into the 3rd interspace of the right foot.  3. Continue wearing good shoe gear.  4. Return to clinic in 6 weeks. If not better, consider alcohol sclerosing agent.    Edrick Kins, DPM [facility]  Dr. Edrick Kins, [address]                                        [place], [place] [address]                Office [phone]  Fax [phone]

## 2018-05-12 MED FILL — METOPROLOL SUCCINATE ER 100: 100 | 90 days supply | Qty: 90 | Fill #0

## 2018-06-16 ENCOUNTER — Ambulatory Visit: Payer: No Typology Code available for payment source | Admitting: Podiatry

## 2018-07-02 MED FILL — AMLODIPINE BESYLATE 10 MG T: 10 | 90 days supply | Qty: 90 | Fill #2

## 2018-07-12 MED FILL — ROSUVASTATIN CALCIUM 20 MG: 20 | 90 days supply | Qty: 90 | Fill #2

## 2018-07-12 MED FILL — POTASSIUM CHLORIDE CRYS ER: 20 | 90 days supply | Qty: 90 | Fill #1

## 2018-07-27 MED FILL — IRBESARTAN-HCTZ 300-12.5 MG: 300-12.5 | 90 days supply | Qty: 90 | Fill #1

## 2018-08-03 MED FILL — ARMODAFINIL 250 MG TABLET: 250 | 90 days supply | Qty: 90 | Fill #1

## 2018-08-12 MED FILL — METOPROLOL SUCCINATE ER 100: 100 | 90 days supply | Qty: 90 | Fill #1

## 2018-08-24 DIAGNOSIS — E7849 Other hyperlipidemia: Secondary | ICD-10-CM | POA: Diagnosis not present

## 2018-08-24 DIAGNOSIS — I10 Essential (primary) hypertension: Secondary | ICD-10-CM | POA: Diagnosis not present

## 2018-09-03 MED FILL — POTASSIUM CHLORIDE CRYS ER: 20 | 90 days supply | Qty: 180 | Fill #0

## 2018-09-24 MED FILL — AMLODIPINE BESYLATE 10 MG T: 10 | 90 days supply | Qty: 90 | Fill #3

## 2018-10-23 MED FILL — IRBESARTAN-HCTZ 300-12.5 MG: 300-12.5 | 90 days supply | Qty: 90 | Fill #2

## 2018-10-25 MED FILL — ROSUVASTATIN CALCIUM 20 MG: 20 | 90 days supply | Qty: 90 | Fill #0

## 2018-11-15 MED FILL — METOPROLOL SUCCINATE ER 100: 100 | 90 days supply | Qty: 90 | Fill #2

## 2018-11-23 DIAGNOSIS — Z23 Encounter for immunization: Secondary | ICD-10-CM | POA: Diagnosis not present

## 2018-11-26 MED FILL — POTASSIUM CHLORIDE CRYS ER: 20 | 90 days supply | Qty: 180 | Fill #1

## 2018-12-03 DIAGNOSIS — G4733 Obstructive sleep apnea (adult) (pediatric): Secondary | ICD-10-CM | POA: Diagnosis not present

## 2018-12-29 MED FILL — AMLODIPINE BESYLATE 10 MG T: 10 | 90 days supply | Qty: 90 | Fill #0

## 2019-01-25 MED FILL — ROSUVASTATIN CALCIUM 20 MG: 20 | 90 days supply | Qty: 90 | Fill #1

## 2019-01-26 MED FILL — IRBESARTAN-HCTZ 300-12.5 MG: 300-12.5 | 90 days supply | Qty: 90 | Fill #3

## 2019-02-14 MED FILL — METOPROLOL SUCCINATE ER 100: 100 | 90 days supply | Qty: 90 | Fill #3

## 2019-03-01 DIAGNOSIS — G4733 Obstructive sleep apnea (adult) (pediatric): Secondary | ICD-10-CM | POA: Diagnosis not present

## 2019-03-02 MED FILL — POTASSIUM CHLORIDE CRYS ER: 20 | 90 days supply | Qty: 180 | Fill #0

## 2019-03-15 MED FILL — ARMODAFINIL 250 MG TABLET: 250 | 30 days supply | Qty: 30 | Fill #0

## 2019-03-22 MED FILL — AMLODIPINE BESYLATE 10 MG T: 10 | 90 days supply | Qty: 90 | Fill #1

## 2019-04-24 MED FILL — ROSUVASTATIN CALCIUM 20 MG: 20 | 90 days supply | Qty: 90 | Fill #2

## 2019-04-25 MED FILL — IRBESARTAN-HCTZ 300-12.5 MG: 300-12.5 | 90 days supply | Qty: 90 | Fill #0

## 2019-05-10 DIAGNOSIS — D2262 Melanocytic nevi of left upper limb, including shoulder: Secondary | ICD-10-CM | POA: Diagnosis not present

## 2019-05-10 DIAGNOSIS — L570 Actinic keratosis: Secondary | ICD-10-CM | POA: Diagnosis not present

## 2019-05-10 DIAGNOSIS — L821 Other seborrheic keratosis: Secondary | ICD-10-CM | POA: Diagnosis not present

## 2019-05-10 DIAGNOSIS — D225 Melanocytic nevi of trunk: Secondary | ICD-10-CM | POA: Diagnosis not present

## 2019-05-10 DIAGNOSIS — D2272 Melanocytic nevi of left lower limb, including hip: Secondary | ICD-10-CM | POA: Diagnosis not present

## 2019-05-11 MED FILL — METOPROLOL SUCCINATE ER 100: 100 | 90 days supply | Qty: 90 | Fill #0

## 2019-05-17 DIAGNOSIS — H401131 Primary open-angle glaucoma, bilateral, mild stage: Secondary | ICD-10-CM | POA: Diagnosis not present

## 2019-06-11 MED FILL — POTASSIUM CHLORIDE CRYS ER: 20 | 90 days supply | Qty: 180 | Fill #1

## 2019-07-01 MED FILL — AMLODIPINE BESYLATE 10 MG T: 10 | 90 days supply | Qty: 90 | Fill #2

## 2019-07-26 ENCOUNTER — Other Ambulatory Visit (HOSPITAL_COMMUNITY): Payer: Self-pay | Admitting: Internal Medicine

## 2019-08-05 DIAGNOSIS — E7849 Other hyperlipidemia: Secondary | ICD-10-CM | POA: Diagnosis not present

## 2019-09-08 MED FILL — AMLODIPINE BESYLATE 10 MG T: 10 | 90 days supply | Qty: 90 | Fill #3

## 2019-09-09 MED FILL — POTASSIUM CHLORIDE CRYS ER: 20 | 90 days supply | Qty: 180 | Fill #0

## 2019-09-28 ENCOUNTER — Other Ambulatory Visit (HOSPITAL_COMMUNITY): Payer: Self-pay | Admitting: Internal Medicine

## 2019-09-28 MED FILL — CARVEDILOL 25 MG TABLET: 25 | 90 days supply | Qty: 90 | Fill #0

## 2019-10-22 MED FILL — ROSUVASTATIN CALCIUM 20 MG: 20 | 90 days supply | Qty: 90 | Fill #1

## 2019-10-24 ENCOUNTER — Other Ambulatory Visit (HOSPITAL_COMMUNITY): Payer: Self-pay | Admitting: Internal Medicine

## 2019-10-24 MED FILL — IRBESARTAN-HCTZ 300-12.5 MG: 300-12.5 | 90 days supply | Qty: 90 | Fill #0

## 2019-11-26 MED FILL — METOPROLOL SUCCINATE ER 100: 100 | 90 days supply | Qty: 90 | Fill #2

## 2019-12-18 MED FILL — CARVEDILOL 25 MG TABLET: 25 | 90 days supply | Qty: 90 | Fill #1

## 2019-12-20 MED FILL — POTASSIUM CHLORIDE CRYS ER: 20 | 90 days supply | Qty: 180 | Fill #1

## 2019-12-22 DIAGNOSIS — Z23 Encounter for immunization: Secondary | ICD-10-CM | POA: Diagnosis not present

## 2020-01-02 ENCOUNTER — Other Ambulatory Visit (HOSPITAL_COMMUNITY): Payer: Self-pay | Admitting: Internal Medicine

## 2020-01-02 MED FILL — AMLODIPINE BESYLATE 10 MG T: 10 | 90 days supply | Qty: 90 | Fill #0

## 2020-01-21 MED FILL — IRBESARTAN-HCTZ 300-12.5 MG: 300-12.5 | 90 days supply | Qty: 90 | Fill #1

## 2020-01-23 MED FILL — ROSUVASTATIN CALCIUM 20 MG: 20 | 90 days supply | Qty: 90 | Fill #2

## 2020-02-21 MED FILL — METOPROLOL SUCCINATE ER 100: 100 | 90 days supply | Qty: 90 | Fill #3

## 2020-03-17 HISTORY — PX: NASAL SEPTUM SURGERY: SHX37

## 2020-03-20 MED FILL — POTASSIUM CHLORIDE CRYS ER: 20 | 90 days supply | Qty: 180 | Fill #2

## 2020-03-20 MED FILL — AMLODIPINE BESYLATE 10 MG T: 10 | 90 days supply | Qty: 90 | Fill #1

## 2020-04-09 DIAGNOSIS — R059 Cough, unspecified: Secondary | ICD-10-CM | POA: Diagnosis not present

## 2020-04-14 MED FILL — IRBESARTAN-HCTZ 300-12.5 MG: 300-12.5 | 90 days supply | Qty: 90 | Fill #2

## 2020-04-14 MED FILL — ROSUVASTATIN CALCIUM 20 MG: 20 | 90 days supply | Qty: 90 | Fill #3

## 2020-04-16 DIAGNOSIS — R059 Cough, unspecified: Secondary | ICD-10-CM | POA: Diagnosis not present

## 2020-04-25 DIAGNOSIS — G4733 Obstructive sleep apnea (adult) (pediatric): Secondary | ICD-10-CM | POA: Diagnosis not present

## 2020-04-27 ENCOUNTER — Other Ambulatory Visit (HOSPITAL_COMMUNITY): Payer: Self-pay | Admitting: Internal Medicine

## 2020-04-27 MED FILL — ARMODAFINIL 250 MG TABS: 250 | 30 days supply | Qty: 30 | Fill #0

## 2020-05-07 MED FILL — CARVEDILOL 25 MG TABS: 25 | 90 days supply | Qty: 90 | Fill #2

## 2020-05-15 DIAGNOSIS — L821 Other seborrheic keratosis: Secondary | ICD-10-CM | POA: Diagnosis not present

## 2020-05-15 DIAGNOSIS — D2262 Melanocytic nevi of left upper limb, including shoulder: Secondary | ICD-10-CM | POA: Diagnosis not present

## 2020-05-15 DIAGNOSIS — D225 Melanocytic nevi of trunk: Secondary | ICD-10-CM | POA: Diagnosis not present

## 2020-05-15 DIAGNOSIS — D2261 Melanocytic nevi of right upper limb, including shoulder: Secondary | ICD-10-CM | POA: Diagnosis not present

## 2020-05-18 DIAGNOSIS — J343 Hypertrophy of nasal turbinates: Secondary | ICD-10-CM | POA: Diagnosis not present

## 2020-05-18 DIAGNOSIS — J342 Deviated nasal septum: Secondary | ICD-10-CM | POA: Diagnosis not present

## 2020-05-18 DIAGNOSIS — J310 Chronic rhinitis: Secondary | ICD-10-CM | POA: Diagnosis not present

## 2020-06-04 ENCOUNTER — Other Ambulatory Visit (HOSPITAL_COMMUNITY): Payer: Self-pay | Admitting: Internal Medicine

## 2020-06-04 MED FILL — METOPROLOL SUCCINATE ER 100: 100 | 90 days supply | Qty: 90 | Fill #0

## 2020-06-07 ENCOUNTER — Other Ambulatory Visit (HOSPITAL_BASED_OUTPATIENT_CLINIC_OR_DEPARTMENT_OTHER): Payer: Self-pay

## 2020-06-11 DIAGNOSIS — Z01818 Encounter for other preprocedural examination: Secondary | ICD-10-CM | POA: Diagnosis not present

## 2020-06-12 DIAGNOSIS — G4733 Obstructive sleep apnea (adult) (pediatric): Secondary | ICD-10-CM | POA: Diagnosis not present

## 2020-06-14 DIAGNOSIS — J342 Deviated nasal septum: Secondary | ICD-10-CM | POA: Diagnosis not present

## 2020-06-14 DIAGNOSIS — J343 Hypertrophy of nasal turbinates: Secondary | ICD-10-CM | POA: Diagnosis not present

## 2020-06-14 DIAGNOSIS — J3489 Other specified disorders of nose and nasal sinuses: Secondary | ICD-10-CM | POA: Diagnosis not present

## 2020-06-23 MED FILL — Amlodipine Besylate Tab 10 MG (Base Equivalent): ORAL | 90 days supply | Qty: 90 | Fill #0 | Status: AC

## 2020-06-25 ENCOUNTER — Other Ambulatory Visit (HOSPITAL_COMMUNITY): Payer: Self-pay

## 2020-06-26 ENCOUNTER — Other Ambulatory Visit (HOSPITAL_COMMUNITY): Payer: Self-pay

## 2020-07-21 ENCOUNTER — Other Ambulatory Visit (HOSPITAL_COMMUNITY): Payer: Self-pay

## 2020-07-23 ENCOUNTER — Other Ambulatory Visit (HOSPITAL_COMMUNITY): Payer: Self-pay

## 2020-07-25 ENCOUNTER — Other Ambulatory Visit (HOSPITAL_COMMUNITY): Payer: Self-pay

## 2020-07-26 ENCOUNTER — Other Ambulatory Visit (HOSPITAL_COMMUNITY): Payer: Self-pay

## 2020-07-26 MED ORDER — ROSUVASTATIN CALCIUM 20 MG PO TABS
20.0000 mg | ORAL_TABLET | Freq: Every day | ORAL | 3 refills | Status: DC
  Filled 2020-07-26: qty 90, 90d supply, fill #0
  Filled 2020-11-12: qty 90, 90d supply, fill #1
  Filled 2021-02-18: qty 90, 90d supply, fill #2
  Filled 2021-05-13: qty 90, 90d supply, fill #3

## 2020-07-27 ENCOUNTER — Other Ambulatory Visit (HOSPITAL_COMMUNITY): Payer: Self-pay

## 2020-07-27 MED FILL — Armodafinil Tab 250 MG: ORAL | 30 days supply | Qty: 30 | Fill #0 | Status: AC

## 2020-07-30 ENCOUNTER — Other Ambulatory Visit (HOSPITAL_COMMUNITY): Payer: Self-pay

## 2020-08-05 ENCOUNTER — Other Ambulatory Visit (HOSPITAL_COMMUNITY): Payer: Self-pay

## 2020-08-05 MED FILL — Irbesartan-Hydrochlorothiazide Tab 300-12.5 MG: ORAL | 90 days supply | Qty: 90 | Fill #0 | Status: AC

## 2020-08-06 ENCOUNTER — Other Ambulatory Visit (HOSPITAL_COMMUNITY): Payer: Self-pay

## 2020-08-06 MED ORDER — CARVEDILOL 25 MG PO TABS
25.0000 mg | ORAL_TABLET | Freq: Every day | ORAL | 2 refills | Status: DC
  Filled 2020-08-06: qty 90, 90d supply, fill #0
  Filled 2020-10-13: qty 90, 90d supply, fill #1
  Filled 2021-01-26: qty 90, 90d supply, fill #2

## 2020-08-07 ENCOUNTER — Other Ambulatory Visit (HOSPITAL_COMMUNITY): Payer: Self-pay

## 2020-08-15 DIAGNOSIS — G4733 Obstructive sleep apnea (adult) (pediatric): Secondary | ICD-10-CM | POA: Diagnosis not present

## 2020-08-15 DIAGNOSIS — E559 Vitamin D deficiency, unspecified: Secondary | ICD-10-CM | POA: Diagnosis not present

## 2020-09-09 MED FILL — Metoprolol Succinate Tab ER 24HR 100 MG (Tartrate Equiv): ORAL | 90 days supply | Qty: 90 | Fill #0 | Status: AC

## 2020-09-10 ENCOUNTER — Other Ambulatory Visit (HOSPITAL_COMMUNITY): Payer: Self-pay

## 2020-09-11 DIAGNOSIS — K5904 Chronic idiopathic constipation: Secondary | ICD-10-CM | POA: Diagnosis not present

## 2020-09-13 ENCOUNTER — Other Ambulatory Visit (HOSPITAL_COMMUNITY): Payer: Self-pay

## 2020-09-13 MED FILL — Armodafinil Tab 250 MG: ORAL | 30 days supply | Qty: 30 | Fill #1 | Status: AC

## 2020-09-26 ENCOUNTER — Other Ambulatory Visit: Payer: Self-pay | Admitting: Internal Medicine

## 2020-09-26 ENCOUNTER — Other Ambulatory Visit (HOSPITAL_COMMUNITY): Payer: Self-pay | Admitting: Internal Medicine

## 2020-09-28 ENCOUNTER — Other Ambulatory Visit (HOSPITAL_COMMUNITY): Payer: Self-pay | Admitting: Internal Medicine

## 2020-09-28 DIAGNOSIS — E213 Hyperparathyroidism, unspecified: Secondary | ICD-10-CM

## 2020-10-04 DIAGNOSIS — E213 Hyperparathyroidism, unspecified: Secondary | ICD-10-CM | POA: Diagnosis not present

## 2020-10-13 MED FILL — Amlodipine Besylate Tab 10 MG (Base Equivalent): ORAL | 90 days supply | Qty: 90 | Fill #1 | Status: AC

## 2020-10-15 ENCOUNTER — Other Ambulatory Visit (HOSPITAL_COMMUNITY): Payer: Self-pay

## 2020-10-19 ENCOUNTER — Other Ambulatory Visit (HOSPITAL_COMMUNITY): Payer: Self-pay

## 2020-10-19 DIAGNOSIS — K621 Rectal polyp: Secondary | ICD-10-CM | POA: Diagnosis not present

## 2020-10-19 DIAGNOSIS — K5730 Diverticulosis of large intestine without perforation or abscess without bleeding: Secondary | ICD-10-CM | POA: Diagnosis not present

## 2020-10-19 DIAGNOSIS — R194 Change in bowel habit: Secondary | ICD-10-CM | POA: Diagnosis not present

## 2020-10-22 ENCOUNTER — Other Ambulatory Visit (HOSPITAL_COMMUNITY): Payer: Self-pay

## 2020-10-22 MED ORDER — IRBESARTAN-HYDROCHLOROTHIAZIDE 300-12.5 MG PO TABS
1.0000 | ORAL_TABLET | Freq: Every morning | ORAL | 3 refills | Status: DC
  Filled 2020-10-22: qty 90, 90d supply, fill #0

## 2020-10-22 MED ORDER — IRBESARTAN-HYDROCHLOROTHIAZIDE 300-12.5 MG PO TABS
ORAL_TABLET | ORAL | 2 refills | Status: DC
  Filled 2020-10-22: qty 90, 90d supply, fill #0
  Filled 2021-02-04: qty 90, 90d supply, fill #1
  Filled 2021-05-13: qty 90, 90d supply, fill #2

## 2020-10-23 ENCOUNTER — Other Ambulatory Visit (HOSPITAL_COMMUNITY): Payer: Self-pay

## 2020-11-12 ENCOUNTER — Other Ambulatory Visit (HOSPITAL_COMMUNITY): Payer: Self-pay

## 2020-11-13 ENCOUNTER — Other Ambulatory Visit (HOSPITAL_COMMUNITY): Payer: Self-pay

## 2020-11-13 MED ORDER — ARMODAFINIL 250 MG PO TABS
ORAL_TABLET | ORAL | 5 refills | Status: DC
  Filled 2020-11-13: qty 30, 30d supply, fill #0

## 2020-11-15 ENCOUNTER — Ambulatory Visit: Payer: Self-pay | Admitting: Surgery

## 2020-11-15 DIAGNOSIS — E210 Primary hyperparathyroidism: Secondary | ICD-10-CM | POA: Diagnosis not present

## 2020-11-21 ENCOUNTER — Other Ambulatory Visit: Payer: Self-pay | Admitting: Surgery

## 2020-11-21 DIAGNOSIS — E213 Hyperparathyroidism, unspecified: Secondary | ICD-10-CM

## 2020-11-21 DIAGNOSIS — G4733 Obstructive sleep apnea (adult) (pediatric): Secondary | ICD-10-CM | POA: Diagnosis not present

## 2020-11-22 ENCOUNTER — Ambulatory Visit
Admission: RE | Admit: 2020-11-22 | Discharge: 2020-11-22 | Disposition: A | Discharge status: Home / Self Care | Payer: BC Managed Care – PPO | Source: Ambulatory Visit | Attending: Surgery | Admitting: Surgery

## 2020-11-22 DIAGNOSIS — E213 Hyperparathyroidism, unspecified: Secondary | ICD-10-CM

## 2020-11-24 NOTE — Progress Notes (Signed)
USN is normal, no sign of thyroid nodules or disease.  Plan to proceed with parathyroidectomy.  [place], MD [facility] A [place] practice Office: [phone]

## 2020-12-13 NOTE — Patient Instructions (Signed)
DUE TO COVID-19 ONLY ONE VISITOR IS ALLOWED TO COME WITH YOU AND STAY IN THE WAITING ROOM ONLY DURING PRE OP AND PROCEDURE.   **NO VISITORS ARE ALLOWED IN THE SHORT STAY AREA OR RECOVERY ROOM!!**  IF YOU WILL BE ADMITTED INTO THE HOSPITAL YOU ARE ALLOWED ONLY TWO SUPPORT PEOPLE DURING VISITATION HOURS ONLY (10AM -8PM)   The support person(s) may change daily. The support person(s) must pass our screening, gel in and out, and wear a mask at all times, including in the patient's room. Patients must also wear a mask when staff or their support person are in the room.  No visitors under the age of 52. Any visitor under the age of 85 must be accompanied by an adult.    Your procedure is scheduled on: 12/27/20   Report to [facility] Main Entrance    Report to admitting at : 8:45 AM   Call this number if you have problems the morning of surgery [phone]   Do not eat food :After Midnight.   May have liquids until : 8:00 AM   day of surgery  CLEAR LIQUID DIET  Foods Allowed                                                                     Foods Excluded  Water, Black Coffee and tea, regular and decaf                             liquids that you cannot  Plain Jell-O in any flavor  (No red)                                           see through such as: Fruit ices (not with fruit pulp)                                     milk, soups, orange juice              Iced Popsicles (No red)                                    All solid food                                   Apple juices Sports drinks like Gatorade (No red) Lightly seasoned clear broth or consume(fat free) Sugar,  Sample Menu Breakfast                                Lunch                                     Supper Cranberry juice  Beef broth                            Chicken broth Jell-O                                     Grape juice                           Apple juice Coffee or tea                         Jell-O                                      Popsicle                                                Coffee or tea                        Coffee or tea     Oral Hygiene is also important to reduce your risk of infection.                                    Remember - BRUSH YOUR TEETH THE MORNING OF SURGERY WITH YOUR REGULAR TOOTHPASTE   Do NOT smoke after Midnight   Take these medicines the morning of surgery with A SIP OF WATER: carvedilol,metoprolol,amlodipine.  DO NOT TAKE ANY ORAL DIABETIC MEDICATIONS DAY OF YOUR SURGERY                              You may not have any metal on your body including hair pins, jewelry, and body piercing             Do not wear lotions, powders, perfumes/cologne, or deodorant              Men may shave face and neck.   Do not bring valuables to the hospital. [place].   Contacts, dentures or bridgework may not be worn into surgery.   Bring small overnight bag day of surgery.    Patients discharged on the day of surgery will not be allowed to drive home.   Special Instructions: Bring a copy of your healthcare power of attorney and living will documents         the day of surgery if you haven't scanned them before.              Please read over the following fact sheets you were given: IF YOU HAVE QUESTIONS ABOUT YOUR PRE-OP INSTRUCTIONS PLEASE CALL [phone]   [facility] - Preparing for Surgery Before surgery, you can play an important role.  Because skin is not sterile, your skin needs to be as free of germs as possible.  You can reduce the number of germs on  your skin by washing with CHG (chlorahexidine gluconate) soap before surgery.  CHG is an antiseptic cleaner which kills germs and bonds with the skin to continue killing germs even after washing. Please DO NOT use if you have an allergy to CHG or antibacterial soaps.  If your skin becomes reddened/irritated stop using the CHG and  inform your nurse when you arrive at Short Stay. Do not shave (including legs and underarms) for at least 48 hours prior to the first CHG shower.  You may shave your face/neck. Please follow these instructions carefully:  1.  Shower with CHG Soap the night before surgery and the  morning of Surgery.  2.  If you choose to wash your hair, wash your hair first as usual with your  normal  shampoo.  3.  After you shampoo, rinse your hair and body thoroughly to remove the  shampoo.                           4.  Use CHG as you would any other liquid soap.  You can apply chg directly  to the skin and wash                       Gently with a scrungie or clean washcloth.  5.  Apply the CHG Soap to your body ONLY FROM THE NECK DOWN.   Do not use on face/ open                           Wound or open sores. Avoid contact with eyes, ears mouth and genitals (private parts).                       Wash face,  Genitals (private parts) with your normal soap.             6.  Wash thoroughly, paying special attention to the area where your surgery  will be performed.  7.  Thoroughly rinse your body with warm water from the neck down.  8.  DO NOT shower/wash with your normal soap after using and rinsing off  the CHG Soap.                9.  Pat yourself dry with a clean towel.            10.  Wear clean pajamas.            11.  Place clean sheets on your bed the night of your first shower and do not  sleep with pets. Day of Surgery : Do not apply any lotions/deodorants the morning of surgery.  Please wear clean clothes to the hospital/surgery center.  FAILURE TO FOLLOW THESE INSTRUCTIONS MAY RESULT IN THE CANCELLATION OF YOUR SURGERY PATIENT SIGNATURE_________________________________  NURSE SIGNATURE__________________________________  ________________________________________________________________________

## 2020-12-14 ENCOUNTER — Other Ambulatory Visit: Payer: Self-pay

## 2020-12-14 ENCOUNTER — Encounter (HOSPITAL_COMMUNITY)
Admission: RE | Admit: 2020-12-14 | Discharge: 2020-12-14 | Disposition: A | Discharge status: Home / Self Care | Payer: BC Managed Care – PPO | Source: Ambulatory Visit | Attending: Surgery | Admitting: Surgery

## 2020-12-14 ENCOUNTER — Encounter (HOSPITAL_COMMUNITY): Payer: Self-pay

## 2020-12-14 DIAGNOSIS — Z01818 Encounter for other preprocedural examination: Secondary | ICD-10-CM | POA: Diagnosis not present

## 2020-12-14 NOTE — Progress Notes (Signed)
COVID Vaccine Completed: Yes Date COVID Vaccine completed: 11/2020. X 4 COVID vaccine manufacturer: [employer] development [employer]    COVID Test: N/A  PCP - Dr. Leanna Battles Cardiologist -   Chest x-ray -  EKG -  Stress Test -  ECHO -  Cardiac Cath -  Pacemaker/ICD device last checked:  Sleep Study - Yes CPAP - Yes  Fasting Blood Sugar -  Checks Blood Sugar _____ times a day  Blood Thinner Instructions: Aspirin Instructions: Last Dose:  Anesthesia review: Hx: HTN,OSA(CPAP)  Patient denies shortness of breath, fever, cough and chest pain at PAT appointment   Patient verbalized understanding of instructions that were given to them at the PAT appointment. Patient was also instructed that they will need to review over the PAT instructions again at home before surgery.

## 2020-12-16 ENCOUNTER — Encounter (HOSPITAL_COMMUNITY): Payer: Self-pay | Admitting: Surgery

## 2020-12-16 DIAGNOSIS — E210 Primary hyperparathyroidism: Secondary | ICD-10-CM | POA: Diagnosis present

## 2020-12-16 NOTE — H&P (Signed)
REFERRING PHYSICIAN: Elise Benne*  PROVIDER: Lahoma Constantin Charlotta Newton, MD   Chief Complaint: New Consultation (Primary hyperparathyroidism)  History of Present Illness:  Patient is referred by Dr. Janie Morning for surgical evaluation and management of newly diagnosed primary hyperparathyroidism. Patient was noted on routine laboratory studies to have an elevated serum calcium level of 11.8. Subsequent laboratories included a PTH level which was elevated at 114 and a normal vitamin D level of 47. Patient was sent for nuclear medicine parathyroid scan which was at [facility] on October 04, 2020. 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 Patient has had no prior head or neck surgery. There is no family history of parathyroid disease or other endocrine neoplasm. Patient has no history of nephrolithiasis. There is no history of osteopenia. Patient does note moderate fatigue. Patient is the Scientist, physiological for [employer].  Review of Systems: A complete review of systems was obtained from the patient. I have reviewed this information and discussed as appropriate with the patient. See HPI as well for other ROS.  Review of Systems  Constitutional: Positive for malaise/fatigue.  HENT: Negative.  Eyes: Negative.  Respiratory: Negative.  Cardiovascular: Negative.  Gastrointestinal: Negative.  Genitourinary: Negative.  Musculoskeletal: Negative.  Skin: Negative.  Neurological: Negative.  Endo/Heme/Allergies: Negative.  Psychiatric/Behavioral: Negative.    Medical History: Past Medical History:  Diagnosis Date   Hypertension   Sleep apnea   Patient Active Problem List  Diagnosis   Primary hyperparathyroidism (CMS-HCC)   Past Surgical History:  Procedure Laterality Date   repair deviated septum N/A    No Known Allergies  Current Outpatient Medications on File Prior to Visit   Medication Sig Dispense Refill   amLODIPine (NORVASC) 10 MG tablet Take 1 tablet by mouth once daily   armodafiniL (NUVIGIL) 250 mg tablet Take 1 tablet by mouth once daily   carvediloL (COREG) 25 MG tablet Take 1 tablet by mouth at bedtime   irbesartan-hydrochlorothiazide (AVALIDE) 300-12.5 mg tablet Take 1 tablet by mouth every morning   metoprolol succinate (TOPROL-XL) 100 MG XL tablet Take 1 tablet by mouth once daily   peg-electrolyte (MOVIPREP) packet use as directed for bowel prep   rosuvastatin (CRESTOR) 20 MG tablet Take 1 tablet by mouth once daily   peg-electrolyte (NULYTELY) solution   potassium chloride (KLOR-CON M20) 20 MEQ ER tablet Take 1 tablet by mouth 2 (two) times daily   No current facility-administered medications on file prior to visit.   Family History  Problem Relation Age of Onset   High blood pressure (Hypertension) Mother   Stroke Father   High blood pressure (Hypertension) Father   Hyperlipidemia (Elevated cholesterol) Father   Diabetes Father    Social History   Tobacco Use  Smoking Status Never Smoker  Smokeless Tobacco Never Used    Social History   Socioeconomic History   Marital status: Married  Tobacco Use   Smoking status: Never Smoker   Smokeless tobacco: Never Used  Scientific laboratory technician Use: Never used  Substance and Sexual Activity   Alcohol use: Yes   Drug use: Never   Objective:   Vitals:  Pulse: 70  Temp: 37 C (98.6 F)  SpO2: 98%  Weight: 92.4 kg (203 lb 9.6 oz)  Height: 172.7 cm (5\' 8" )   Body mass index is 30.96 kg/m.  Physical Exam   GENERAL APPEARANCE Development: normal Nutritional status: normal  Gross deformities: none  SKIN Rash, lesions, ulcers: none Induration, erythema: none Nodules: none palpable  EYES Conjunctiva and lids: normal Pupils: equal and reactive Iris: normal bilaterally  EARS, NOSE, MOUTH, THROAT External ears: no lesion or deformity External nose: no lesion or  deformity Hearing: grossly normal Due to Covid-19 pandemic, patient is wearing a mask.  NECK Symmetric: yes Trachea: midline Thyroid: no palpable nodules in the thyroid bed  CHEST Respiratory effort: normal Retraction or accessory muscle use: no Breath sounds: normal bilaterally Rales, rhonchi, wheeze: none  CARDIOVASCULAR Auscultation: regular rhythm, normal rate Murmurs: none Pulses: radial pulse 2+ palpable Lower extremity edema: none  MUSCULOSKELETAL Station and gait: normal Digits and nails: no clubbing or cyanosis Muscle strength: grossly normal all extremities Range of motion: grossly normal all extremities Deformity: none  LYMPHATIC Cervical: none palpable Supraclavicular: none palpable  PSYCHIATRIC Oriented to person, place, and time: yes Mood and affect: normal for situation Judgment and insight: appropriate for situation  Assessment and Plan:   Primary hyperparathyroidism (CMS-HCC)   Patient is referred by his primary care physician, Dr. Leanna Battles, for surgical evaluation and management of primary hyperparathyroidism. Laboratory studies are consistent with the diagnosis. Nuclear medicine imaging study confirms a left superior parathyroid adenoma. The patient has not had an ultrasound examination of the neck including the thyroid. We will arrange for the studies in the near future to rule out concurrent disease in the thyroid and also to possibly confirm the location of the left superior adenoma.  Patient provided with a copy of "Parathyroid Surgery: Treatment for Your Parathyroid Gland Problem", published by [employer], 12 pages. Book reviewed and explained to patient during visit today.  Today we discussed proceeding with minimally invasive parathyroidectomy. We discussed the procedure. We discussed the location of the surgical incision. We discussed the hospital stay to be anticipated. We discussed potential complications including the potential for  recurrent laryngeal nerve injury. We discussed his postoperative recovery and return to work and activities. The patient understands and wishes to proceed with surgery in the near future.  The risks and benefits of the procedure have been discussed at length with the patient. The patient understands the proposed procedure, potential alternative treatments, and the course of recovery to be expected. All of the patient's questions have been answered at this time. The patient wishes to proceed with surgery.   Armandina Gemma, MD [facility] [place] practice Office: [phone]

## 2020-12-21 ENCOUNTER — Encounter (HOSPITAL_COMMUNITY)
Admission: RE | Admit: 2020-12-21 | Discharge: 2020-12-21 | Disposition: A | Discharge status: Home / Self Care | Payer: BC Managed Care – PPO | Source: Ambulatory Visit | Attending: Surgery | Admitting: Surgery

## 2020-12-21 ENCOUNTER — Other Ambulatory Visit: Payer: Self-pay

## 2020-12-21 DIAGNOSIS — G4733 Obstructive sleep apnea (adult) (pediatric): Secondary | ICD-10-CM | POA: Diagnosis not present

## 2020-12-21 DIAGNOSIS — Z01818 Encounter for other preprocedural examination: Secondary | ICD-10-CM | POA: Insufficient documentation

## 2020-12-21 LAB — CBC
HCT: 46.2 % (ref 39.0–52.0)
Hemoglobin: 15.7 g/dL (ref 13.0–17.0)
MCH: 29.2 pg (ref 26.0–34.0)
MCHC: 34 g/dL (ref 30.0–36.0)
MCV: 85.9 fL (ref 80.0–100.0)
Platelets: 193 10*3/uL (ref 150–400)
RBC: 5.38 MIL/uL (ref 4.22–5.81)
RDW: 13.6 % (ref 11.5–15.5)
WBC: 6.4 10*3/uL (ref 4.0–10.5)
nRBC: 0 % (ref 0.0–0.2)

## 2020-12-21 LAB — BASIC METABOLIC PANEL
Anion gap: 9 (ref 5–15)
BUN: 16 mg/dL (ref 8–23)
CO2: 28 mmol/L (ref 22–32)
Calcium: 10.6 mg/dL — ABNORMAL HIGH (ref 8.9–10.3)
Chloride: 105 mmol/L (ref 98–111)
Creatinine, Ser: 0.92 mg/dL (ref 0.61–1.24)
GFR, Estimated: >60 mL/min (ref >60)
Glucose, Bld: 100 mg/dL — ABNORMAL HIGH (ref 70–99)
Potassium: 3.5 mmol/L (ref 3.5–5.1)
Sodium: 142 mmol/L (ref 135–145)

## 2020-12-23 MED FILL — Metoprolol Succinate Tab ER 24HR 100 MG (Tartrate Equiv): ORAL | 90 days supply | Qty: 90 | Fill #1 | Status: AC

## 2020-12-24 ENCOUNTER — Other Ambulatory Visit (HOSPITAL_COMMUNITY): Payer: Self-pay

## 2020-12-25 ENCOUNTER — Encounter (HOSPITAL_COMMUNITY): Payer: Self-pay | Admitting: Surgery

## 2020-12-27 ENCOUNTER — Ambulatory Visit (HOSPITAL_COMMUNITY)
Admission: RE | Admit: 2020-12-27 | Discharge: 2020-12-28 | Disposition: A | Discharge status: Home / Self Care | Payer: BC Managed Care – PPO | Source: Ambulatory Visit | Attending: Surgery | Admitting: Surgery

## 2020-12-27 ENCOUNTER — Encounter (HOSPITAL_COMMUNITY)
Admission: RE | Disposition: A | Discharge status: Home / Self Care | Payer: Self-pay | Source: Ambulatory Visit | Attending: Surgery

## 2020-12-27 ENCOUNTER — Ambulatory Visit (HOSPITAL_COMMUNITY): Payer: BC Managed Care – PPO | Admitting: Anesthesiology

## 2020-12-27 ENCOUNTER — Other Ambulatory Visit: Payer: Self-pay

## 2020-12-27 ENCOUNTER — Encounter (HOSPITAL_COMMUNITY): Payer: Self-pay | Admitting: Surgery

## 2020-12-27 DIAGNOSIS — E210 Primary hyperparathyroidism: Secondary | ICD-10-CM | POA: Insufficient documentation

## 2020-12-27 DIAGNOSIS — Z79899 Other long term (current) drug therapy: Secondary | ICD-10-CM | POA: Insufficient documentation

## 2020-12-27 DIAGNOSIS — Z20822 Contact with and (suspected) exposure to covid-19: Secondary | ICD-10-CM | POA: Diagnosis not present

## 2020-12-27 DIAGNOSIS — D351 Benign neoplasm of parathyroid gland: Secondary | ICD-10-CM | POA: Insufficient documentation

## 2020-12-27 DIAGNOSIS — Z9989 Dependence on other enabling machines and devices: Secondary | ICD-10-CM | POA: Diagnosis not present

## 2020-12-27 DIAGNOSIS — G4733 Obstructive sleep apnea (adult) (pediatric): Secondary | ICD-10-CM | POA: Diagnosis not present

## 2020-12-27 DIAGNOSIS — I10 Essential (primary) hypertension: Secondary | ICD-10-CM | POA: Diagnosis not present

## 2020-12-27 HISTORY — PX: PARATHYROIDECTOMY: SHX19

## 2020-12-27 LAB — RESP PANEL BY RT-PCR (FLU A&B, COVID) ARPGX2
Influenza A by PCR: NEGATIVE
Influenza B by PCR: NEGATIVE
SARS Coronavirus 2 by RT PCR: NEGATIVE

## 2020-12-27 SURGERY — PARATHYROIDECTOMY
Anesthesia: General | Site: Neck

## 2020-12-27 MED ORDER — FENTANYL CITRATE (PF) 100 MCG/2ML IJ SOLN
INTRAMUSCULAR | Status: AC
  Filled 2020-12-27: qty 2

## 2020-12-27 MED ORDER — CEFAZOLIN SODIUM-DEXTROSE 2-4 GM/100ML-% IV SOLN
2.0000 g | INTRAVENOUS | Status: AC
  Administered 2020-12-27: 2 g via INTRAVENOUS
  Filled 2020-12-27: qty 100

## 2020-12-27 MED ORDER — EPHEDRINE 5 MG/ML INJ
INTRAVENOUS | Status: AC
  Filled 2020-12-27: qty 5

## 2020-12-27 MED ORDER — BUPIVACAINE HCL (PF) 0.5 % IJ SOLN
INTRAMUSCULAR | Status: DC | PRN
  Administered 2020-12-27: 10 mL

## 2020-12-27 MED ORDER — GLYCOPYRROLATE 0.2 MG/ML IJ SOLN
INTRAMUSCULAR | Status: AC
  Filled 2020-12-27: qty 1

## 2020-12-27 MED ORDER — CHLORHEXIDINE GLUCONATE 0.12 % MT SOLN
15.0000 mL | Freq: Once | OROMUCOSAL | Status: AC
  Administered 2020-12-27: 15 mL via OROMUCOSAL

## 2020-12-27 MED ORDER — MIDAZOLAM HCL 2 MG/2ML IJ SOLN
INTRAMUSCULAR | Status: AC
  Filled 2020-12-27: qty 2

## 2020-12-27 MED ORDER — LIDOCAINE 2% (20 MG/ML) 5 ML SYRINGE
INTRAMUSCULAR | Status: DC | PRN
  Administered 2020-12-27: 60 mg via INTRAVENOUS

## 2020-12-27 MED ORDER — DEXAMETHASONE SODIUM PHOSPHATE 10 MG/ML IJ SOLN
INTRAMUSCULAR | Status: DC | PRN
  Administered 2020-12-27: 10 mg via INTRAVENOUS

## 2020-12-27 MED ORDER — PHENYLEPHRINE HCL-NACL 20-0.9 MG/250ML-% IV SOLN
INTRAVENOUS | Status: DC | PRN
  Administered 2020-12-27: 20 ug/min via INTRAVENOUS

## 2020-12-27 MED ORDER — OXYCODONE HCL 5 MG PO TABS: 5.0000 mg | ORAL_TABLET | ORAL | Status: DC | PRN

## 2020-12-27 MED ORDER — ROCURONIUM BROMIDE 10 MG/ML (PF) SYRINGE
PREFILLED_SYRINGE | INTRAVENOUS | Status: AC
  Filled 2020-12-27: qty 20

## 2020-12-27 MED ORDER — TRAMADOL HCL 50 MG PO TABS
50.0000 mg | ORAL_TABLET | Freq: Four times a day (QID) | ORAL | Status: DC | PRN
  Administered 2020-12-27: 50 mg via ORAL
  Filled 2020-12-27: qty 1

## 2020-12-27 MED ORDER — ACETAMINOPHEN 160 MG/5ML PO SOLN: 325.0000 mg | ORAL | Status: DC | PRN

## 2020-12-27 MED ORDER — METOPROLOL SUCCINATE ER 50 MG PO TB24: 100.0000 mg | ORAL_TABLET | Freq: Every day | ORAL | Status: DC

## 2020-12-27 MED ORDER — EPHEDRINE SULFATE-NACL 50-0.9 MG/10ML-% IV SOSY
PREFILLED_SYRINGE | INTRAVENOUS | Status: DC | PRN
  Administered 2020-12-27: 5 mg via INTRAVENOUS
  Administered 2020-12-27: 10 mg via INTRAVENOUS
  Administered 2020-12-27: 15 mg via INTRAVENOUS

## 2020-12-27 MED ORDER — ARTIFICIAL TEARS OPHTHALMIC OINT
TOPICAL_OINTMENT | OPHTHALMIC | Status: AC
  Filled 2020-12-27: qty 14

## 2020-12-27 MED ORDER — PROPOFOL 10 MG/ML IV BOLUS
INTRAVENOUS | Status: DC | PRN
  Administered 2020-12-27: 200 mg via INTRAVENOUS

## 2020-12-27 MED ORDER — COQ10 100 MG PO CAPS: 100.0000 mg | ORAL_CAPSULE | Freq: Every day | ORAL | Status: DC

## 2020-12-27 MED ORDER — SUGAMMADEX SODIUM 200 MG/2ML IV SOLN
INTRAVENOUS | Status: DC | PRN
  Administered 2020-12-27: 200 mg via INTRAVENOUS

## 2020-12-27 MED ORDER — FLUTICASONE PROPIONATE 50 MCG/ACT NA SUSP
1.0000 | Freq: Every day | NASAL | Status: DC
  Filled 2020-12-27: qty 16

## 2020-12-27 MED ORDER — ORAL CARE MOUTH RINSE: 15.0000 mL | Freq: Once | OROMUCOSAL | Status: AC

## 2020-12-27 MED ORDER — ACETAMINOPHEN 325 MG PO TABS: 325.0000 mg | ORAL_TABLET | ORAL | Status: DC | PRN

## 2020-12-27 MED ORDER — FENTANYL CITRATE PF 50 MCG/ML IJ SOSY
25.0000 ug | PREFILLED_SYRINGE | INTRAMUSCULAR | Status: DC | PRN
  Administered 2020-12-27 (×2): 25 ug via INTRAVENOUS

## 2020-12-27 MED ORDER — CHLORHEXIDINE GLUCONATE CLOTH 2 % EX PADS: 6.0000 | MEDICATED_PAD | Freq: Once | CUTANEOUS | Status: DC

## 2020-12-27 MED ORDER — ONDANSETRON HCL 4 MG/2ML IJ SOLN: 4.0000 mg | Freq: Once | INTRAMUSCULAR | Status: DC | PRN

## 2020-12-27 MED ORDER — POTASSIUM CHLORIDE CRYS ER 20 MEQ PO TBCR
20.0000 meq | EXTENDED_RELEASE_TABLET | Freq: Every day | ORAL | Status: DC
  Administered 2020-12-27: 20 meq via ORAL
  Filled 2020-12-27: qty 1

## 2020-12-27 MED ORDER — BUPIVACAINE HCL (PF) 0.5 % IJ SOLN
INTRAMUSCULAR | Status: AC
  Filled 2020-12-27: qty 30

## 2020-12-27 MED ORDER — OLOPATADINE HCL 0.1 % OP SOLN
1.0000 [drp] | Freq: Two times a day (BID) | OPHTHALMIC | Status: DC
  Filled 2020-12-27: qty 5

## 2020-12-27 MED ORDER — IRBESARTAN 150 MG PO TABS: 300.0000 mg | ORAL_TABLET | Freq: Every day | ORAL | Status: DC

## 2020-12-27 MED ORDER — ONDANSETRON HCL 4 MG/2ML IJ SOLN
INTRAMUSCULAR | Status: DC | PRN
  Administered 2020-12-27: 4 mg via INTRAVENOUS

## 2020-12-27 MED ORDER — DEXAMETHASONE SODIUM PHOSPHATE 10 MG/ML IJ SOLN
INTRAMUSCULAR | Status: AC
  Filled 2020-12-27: qty 3

## 2020-12-27 MED ORDER — GLYCOPYRROLATE 0.2 MG/ML IJ SOLN
INTRAMUSCULAR | Status: DC | PRN
  Administered 2020-12-27: .2 mg via INTRAVENOUS

## 2020-12-27 MED ORDER — LIDOCAINE HCL (PF) 2 % IJ SOLN
INTRAMUSCULAR | Status: AC
  Filled 2020-12-27: qty 30

## 2020-12-27 MED ORDER — HYDROCHLOROTHIAZIDE 12.5 MG PO CAPS: 12.5000 mg | ORAL_CAPSULE | Freq: Every day | ORAL | Status: DC

## 2020-12-27 MED ORDER — MIDAZOLAM HCL 5 MG/5ML IJ SOLN
INTRAMUSCULAR | Status: DC | PRN
  Administered 2020-12-27: 2 mg via INTRAVENOUS

## 2020-12-27 MED ORDER — FENTANYL CITRATE (PF) 100 MCG/2ML IJ SOLN
INTRAMUSCULAR | Status: DC | PRN
  Administered 2020-12-27: 100 ug via INTRAVENOUS

## 2020-12-27 MED ORDER — ACETAMINOPHEN 10 MG/ML IV SOLN
INTRAVENOUS | Status: DC | PRN
  Administered 2020-12-27: 1000 mg via INTRAVENOUS

## 2020-12-27 MED ORDER — HYDROMORPHONE HCL 1 MG/ML IJ SOLN: 1.0000 mg | INTRAMUSCULAR | Status: DC | PRN

## 2020-12-27 MED ORDER — ROCURONIUM BROMIDE 10 MG/ML (PF) SYRINGE
PREFILLED_SYRINGE | INTRAVENOUS | Status: DC | PRN
  Administered 2020-12-27: 60 mg via INTRAVENOUS

## 2020-12-27 MED ORDER — LACTATED RINGERS IV SOLN: INTRAVENOUS | Status: DC

## 2020-12-27 MED ORDER — FENTANYL CITRATE PF 50 MCG/ML IJ SOSY
PREFILLED_SYRINGE | INTRAMUSCULAR | Status: AC
  Filled 2020-12-27: qty 1

## 2020-12-27 MED ORDER — MEPERIDINE HCL 50 MG/ML IJ SOLN: 6.2500 mg | INTRAMUSCULAR | Status: DC | PRN

## 2020-12-27 MED ORDER — HEMOSTATIC AGENTS (NO CHARGE) OPTIME
TOPICAL | Status: DC | PRN
  Administered 2020-12-27: 1

## 2020-12-27 MED ORDER — LIDOCAINE HCL (PF) 2 % IJ SOLN
INTRAMUSCULAR | Status: DC | PRN
  Administered 2020-12-27: 1.5 mg/kg/h via INTRADERMAL

## 2020-12-27 MED ORDER — ONDANSETRON 4 MG PO TBDP: 4.0000 mg | ORAL_TABLET | Freq: Four times a day (QID) | ORAL | Status: DC | PRN

## 2020-12-27 MED ORDER — CARVEDILOL 25 MG PO TABS
25.0000 mg | ORAL_TABLET | Freq: Every day | ORAL | Status: DC
  Administered 2020-12-27: 25 mg via ORAL
  Filled 2020-12-27: qty 1

## 2020-12-27 MED ORDER — IRBESARTAN-HYDROCHLOROTHIAZIDE 300-12.5 MG PO TABS: 1.0000 | ORAL_TABLET | Freq: Every morning | ORAL | Status: DC

## 2020-12-27 MED ORDER — OXYCODONE HCL 5 MG PO TABS
5.0000 mg | ORAL_TABLET | Freq: Once | ORAL | Status: DC | PRN
Start: 2020-12-27 — End: 2020-12-27

## 2020-12-27 MED ORDER — OXYCODONE HCL 5 MG/5ML PO SOLN
5.0000 mg | Freq: Once | ORAL | Status: DC | PRN
Start: 2020-12-27 — End: 2020-12-27

## 2020-12-27 MED ORDER — SODIUM CHLORIDE 0.45 % IV SOLN: INTRAVENOUS | Status: DC

## 2020-12-27 MED ORDER — ONDANSETRON HCL 4 MG/2ML IJ SOLN
INTRAMUSCULAR | Status: AC
  Filled 2020-12-27: qty 6

## 2020-12-27 MED ORDER — ONDANSETRON HCL 4 MG/2ML IJ SOLN: 4.0000 mg | Freq: Four times a day (QID) | INTRAMUSCULAR | Status: DC | PRN

## 2020-12-27 MED ORDER — AMLODIPINE BESYLATE 10 MG PO TABS: 10.0000 mg | ORAL_TABLET | Freq: Every day | ORAL | Status: DC

## 2020-12-27 MED ORDER — ACETAMINOPHEN 650 MG RE SUPP: 650.0000 mg | Freq: Four times a day (QID) | RECTAL | Status: DC | PRN

## 2020-12-27 MED ORDER — 0.9 % SODIUM CHLORIDE (POUR BTL) OPTIME
TOPICAL | Status: DC | PRN
  Administered 2020-12-27: 1000 mL

## 2020-12-27 MED ORDER — ACETAMINOPHEN 325 MG PO TABS: 650.0000 mg | ORAL_TABLET | Freq: Four times a day (QID) | ORAL | Status: DC | PRN

## 2020-12-27 SURGICAL SUPPLY — 31 items
ATTRACTOMAT 16X20 MAGNETIC DRP (DRAPES) ×2 IMPLANT
BAG COUNTER SPONGE SURGICOUNT (BAG) ×2 IMPLANT
BLADE SURG 15 STRL LF DISP TIS (BLADE) ×1 IMPLANT
BLADE SURG 15 STRL SS (BLADE) ×1
CHLORAPREP W/TINT 26 (MISCELLANEOUS) ×2 IMPLANT
CLIP TI MEDIUM 6 (CLIP) ×4 IMPLANT
CLIP TI WIDE RED SMALL 6 (CLIP) ×4 IMPLANT
COVER SURGICAL LIGHT HANDLE (MISCELLANEOUS) ×2 IMPLANT
DERMABOND ADVANCED (GAUZE/BANDAGES/DRESSINGS) ×1
DERMABOND ADVANCED .7 DNX12 (GAUZE/BANDAGES/DRESSINGS) ×1 IMPLANT
DRAPE LAPAROTOMY T 98X78 PEDS (DRAPES) ×2 IMPLANT
DRAPE UTILITY XL STRL (DRAPES) ×2 IMPLANT
ELECT REM PT RETURN 15FT ADLT (MISCELLANEOUS) ×2 IMPLANT
GAUZE 4X4 16PLY ~~LOC~~+RFID DBL (SPONGE) ×2 IMPLANT
GLOVE SURG SYN 7.5  E (GLOVE) ×4
GLOVE SURG SYN 7.5 E (GLOVE) ×2 IMPLANT
GOWN STRL REUS W/TWL XL LVL3 (GOWN DISPOSABLE) ×6 IMPLANT
HEMOSTAT SURGICEL 2X4 FIBR (HEMOSTASIS) ×2 IMPLANT
ILLUMINATOR WAVEGUIDE N/F (MISCELLANEOUS) IMPLANT
KIT BASIN OR (CUSTOM PROCEDURE TRAY) ×2 IMPLANT
KIT TURNOVER KIT A (KITS) ×2 IMPLANT
NEEDLE HYPO 25X1 1.5 SAFETY (NEEDLE) ×2 IMPLANT
PACK BASIC VI WITH GOWN DISP (CUSTOM PROCEDURE TRAY) ×2 IMPLANT
PENCIL SMOKE EVACUATOR (MISCELLANEOUS) ×2 IMPLANT
SUT MNCRL AB 4-0 PS2 18 (SUTURE) ×2 IMPLANT
SUT VIC AB 3-0 SH 18 (SUTURE) ×4 IMPLANT
SYR BULB IRRIG 60ML STRL (SYRINGE) ×2 IMPLANT
SYR CONTROL 10ML LL (SYRINGE) ×2 IMPLANT
TOWEL OR 17X26 10 PK STRL BLUE (TOWEL DISPOSABLE) ×2 IMPLANT
TOWEL OR NON WOVEN STRL DISP B (DISPOSABLE) ×2 IMPLANT
TUBING CONNECTING 10 (TUBING) ×2 IMPLANT

## 2020-12-27 NOTE — Anesthesia Postprocedure Evaluation (Signed)
Anesthesia Post Note  Patient: Alan Spencer  Procedure(s) Performed: NECK EXPLORATION WITH BIOPSY OF PARATHYROID GLAND x3 (Neck)     Patient location during evaluation: PACU Anesthesia Type: General Level of consciousness: awake and alert Pain management: pain level controlled Vital Signs Assessment: post-procedure vital signs reviewed and stable Respiratory status: spontaneous breathing, nonlabored ventilation, respiratory function stable and patient connected to nasal cannula oxygen Cardiovascular status: blood pressure returned to baseline and stable Postop Assessment: no apparent nausea or vomiting Anesthetic complications: no   No notable events documented.  Last Vitals:  Vitals:   12/27/20 0956  BP: (!) 193/95  Pulse: 60  Resp: 16  Temp: 36.8 C  SpO2: 99%    Last Pain:  Vitals:   12/27/20 0956  TempSrc: Oral  PainSc: 0-No pain                 [name]

## 2020-12-27 NOTE — Interval H&P Note (Signed)
History and Physical Interval Note:  12/27/2020 10:36 AM  Alan Spencer  has presented today for surgery, with the diagnosis of PRIMARY HYPERPARATHYROIDISM.  The various methods of treatment have been discussed with the patient and family. After consideration of risks, benefits and other options for treatment, the patient has consented to    Procedure(s): LEFT SUPERIOR PARATHYROIDECTOMY (Left) as a surgical intervention.    The patient's history has been reviewed, patient examined, no change in status, stable for surgery.  I have reviewed the patient's chart and labs.  Questions were answered to the patient's satisfaction.    Armandina Gemma, [place] [facility] [place] practice Office: [place]

## 2020-12-27 NOTE — Progress Notes (Signed)
PHARMACIST - PHYSICIAN ORDER COMMUNICATION  CONCERNING: P&T Medication Policy on Herbal Medications  DESCRIPTION:  This patient's order for:  CoQ10  has been noted.  This product(s) is classified as an "herbal" or natural product. Due to a lack of definitive safety studies or FDA approval, nonstandard manufacturing practices, plus the potential risk of unknown drug-drug interactions while on inpatient medications, the Pharmacy and Therapeutics Committee does not permit the use of "herbal" or natural products of this type within [facility].   ACTION TAKEN: The pharmacy department is unable to verify this order at this time and your patient has been informed of this safety policy. Please reevaluate patient's clinical condition at discharge and address if the herbal or natural product(s) should be resumed at that time.   Peggyann Juba, PharmD, BCPS Pharmacy: [phone] 12/27/2020 3:26 PM

## 2020-12-27 NOTE — Anesthesia Preprocedure Evaluation (Addendum)
Anesthesia Evaluation  Patient identified by MRN, date of birth, ID band Patient awake    Reviewed: Allergy & Precautions, H&P , NPO status , Patient's Chart, lab work & pertinent test results, reviewed documented beta blocker date and time   Airway Mallampati: II  TM Distance: >3 FB Neck ROM: full    Dental no notable dental hx. (+) Teeth Intact, Caps, Dental Advisory Given   Pulmonary sleep apnea and Continuous Positive Airway Pressure Ventilation ,    Pulmonary exam normal breath sounds clear to auscultation       Cardiovascular Exercise Tolerance: Good hypertension, Pt. on medications and Pt. on home beta blockers  Rhythm:regular Rate:Normal     Neuro/Psych Anxiety negative neurological ROS  negative psych ROS   GI/Hepatic negative GI ROS, Neg liver ROS,   Endo/Other  Morbid obesity  Renal/GU negative Renal ROS  negative genitourinary   Musculoskeletal   Abdominal   Peds  Hematology negative hematology ROS (+)   Anesthesia Other Findings   Reproductive/Obstetrics negative OB ROS                            Anesthesia Physical Anesthesia Plan  ASA: 3  Anesthesia Plan: General   Post-op Pain Management:    Induction: Intravenous  PONV Risk Score and Plan: 2 and Dexamethasone  Airway Management Planned: Oral ETT  Additional Equipment: None  Intra-op Plan:   Post-operative Plan: Extubation in OR  Informed Consent: I have reviewed the patients History and Physical, chart, labs and discussed the procedure including the risks, benefits and alternatives for the proposed anesthesia with the patient or authorized representative who has indicated his/her understanding and acceptance.     Dental Advisory Given  Plan Discussed with: CRNA and Anesthesiologist  Anesthesia Plan Comments: (  )       Anesthesia Quick Evaluation

## 2020-12-27 NOTE — Transfer of Care (Signed)
Immediate Anesthesia Transfer of Care Note  Patient: Alan Spencer  Procedure(s) Performed: NECK EXPLORATION WITH BIOPSY OF PARATHYROID GLAND x3 (Neck)  Patient Location: PACU  Anesthesia Type:General  Level of Consciousness: awake, alert , oriented and patient cooperative  Airway & Oxygen Therapy: Patient Spontanous Breathing and Patient connected to face mask oxygen  Post-op Assessment: Report given to RN and Post -op Vital signs reviewed and stable  Post vital signs: Reviewed and stable  Last Vitals:  Vitals Value Taken Time  BP 163/81 12/27/20 1319  Temp    Pulse 65 12/27/20 1324  Resp 22 12/27/20 1324  SpO2 97 % 12/27/20 1324  Vitals shown include unvalidated device data.  Last Pain:  Vitals:   12/27/20 0956  TempSrc: Oral  PainSc: 0-No pain         Complications: No notable events documented.

## 2020-12-27 NOTE — Op Note (Signed)
Operative Note  Pre-operative Diagnosis:  primary hyperparathyroidism  Post-operative Diagnosis:  same  Surgeon:  Armandina Gemma, MD  Assistant:  none   Procedure:  Neck exploration with biopsy of parathyroid glands X 3  Anesthesia:  general  Estimated Blood Loss:  minimal  Drains: none         Specimen: Biopsy of normal left superior, right superior, and right inferior parathyroid glands  Indications:  Patient is referred by Dr. Janie Morning for surgical evaluation and management of newly diagnosed primary hyperparathyroidism. Patient was noted on routine laboratory studies to have an elevated serum calcium level of 11.8. Subsequent laboratories included a PTH level which was elevated at 114 and a normal vitamin D level of 47. Patient was sent for nuclear medicine parathyroid scan which was at [facility] on October 04, 2020. 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 An ultrasound failed to identify any sign of an enlarged parathyroid gland.  Patient now comes to surgery for parathyroidectomy.  Procedure:  The patient was seen in the pre-op holding area. The risks, benefits, complications, treatment options, and expected outcomes were previously discussed with the patient. The patient agreed with the proposed plan and has signed the informed consent form.  The patient was brought to the operating room by the surgical team, identified as Rupert Stacks and the procedure verified. A "time out" was completed and the above information confirmed.  Following induction of general anesthesia, the patient was positioned and then prepped and draped in the usual aseptic fashion.  After ascertaining then an adequate level of anesthesia been achieved, an incision is made in the left anterior neck with a #15 blade.  Dissection is carried through subcutaneous tissues and platysma.  Hemostasis is achieved with the electrocautery.  Skin  flaps are elevated beneath the platysma circumferentially and a Weitlander retractor placed for exposure.  Strap muscles were incised in the midline and reflected to the left exposing the left thyroid lobe.  Left lobe is relatively small and slightly nodular.  There did not appear to be any dominant or discrete masses.  Left thyroid lobe was mobilized.  Venous tributaries are divided between small ligaclips.  Exploration along the superior pole fails to reveal any sign of enlarged parathyroid tissue.  Dissection is carried posteriorly to the precervical fascia and behind the pharynx.  There is no sign of parathyroid adenoma.  The previous nuclear medicine study is reviewed but the finding noted on that study is not evident during the dissection.  The entire left thyroid lobe is mobilized.  Inferior thyroid artery is identified.  Dissection along the posterior edge of the thyroid gland reveals what appears to be a normal left superior parathyroid gland.  Using a medium Ligaclip a excisional biopsy is taken and submitted to pathology.  Pathology confirms normal parathyroid tissue at the left superior position.  Further exploration is then performed along the inferior portion of the left thyroid lobe.  No parathyroid tissue is identified.  No evidence of adenoma is identified.  Dissection is carried along the lateral edge of the esophagus and posteriorly to the fascia.  Decision is made to explore the right side.  Incision is slightly extended.  Strap muscles are mobilized off of the right thyroid lobe and the right thyroid lobe is gently mobilized with blunt dissection.  Exploration on the right side reveals a normal right superior parathyroid gland as well as a normal  right inferior parathyroid gland.  Excisional biopsy from each gland is taken using a medium Ligaclip to mark the gland.  Pathology reviewed each biopsy on the right side and found normal parathyroid tissue.  Next we turned our attention back to  the left side.  The left thyroid thymic tract is then dissected out from the thyroid beneath the proximal clavicle and into the anterior mediastinum.  This appears to be simply fibroadipose tissue which is excised and submitted to pathology for review.  There is no evidence of adenoma.  Additional dissection posteriorly to the manubrium sternum I reveals what appears to be atrophic thymic tissue.  A small amount of this tissue is included with the specimen labeled as thyroid thymic tract.  Next the carotid sheath is opened and explored.  There is no evidence of parathyroid adenoma within the left carotid sheath.  At this point a decision is made to discontinue further dissection.  The left thyroid lobe was carefully inspected and there was no sign of adenoma on the surface of the left thyroid lobe.  Good hemostasis is noted throughout the operative field.  Fibrillar is placed throughout the operative field.  Strap muscles are reapproximated in the midline of interrupted 3-0 Vicryl sutures.  Platysma was closed with interrupted 3-0 Vicryl sutures.  Skin is anesthetized with local anesthetic and skin edges are reapproximated with a running 4-0 Monocryl subcuticular suture.  Wound is washed and dried and Dermabond is applied as dressing.  Patient is awakened from anesthesia and transported to the recovery room in stable condition.  The patient tolerated the procedure well.   Armandina Gemma, [facility] Office: [phone]

## 2020-12-27 NOTE — Anesthesia Procedure Notes (Signed)
Procedure Name: Intubation Date/Time: 12/27/2020 11:05 AM Performed by: Cleda Daub, CRNA Pre-anesthesia Checklist: Patient identified, Emergency Drugs available, Suction available and Patient being monitored Patient Re-evaluated:Patient Re-evaluated prior to induction Oxygen Delivery Method: Circle system utilized Preoxygenation: Pre-oxygenation with 100% oxygen Induction Type: IV induction Ventilation: Mask ventilation without difficulty and Oral airway inserted - appropriate to patient size Laryngoscope Size: Mac and 4 Grade View: Grade II Tube type: Oral Tube size: 7.5 mm Number of attempts: 1 Airway Equipment and Method: Stylet and Oral airway Placement Confirmation: ETT inserted through vocal cords under direct vision, positive ETCO2 and breath sounds checked- equal and bilateral Secured at: 23 cm Tube secured with: Tape Dental Injury: Teeth and Oropharynx as per pre-operative assessment

## 2020-12-28 ENCOUNTER — Other Ambulatory Visit (HOSPITAL_COMMUNITY): Payer: Self-pay

## 2020-12-28 ENCOUNTER — Encounter (HOSPITAL_COMMUNITY): Payer: Self-pay | Admitting: Surgery

## 2020-12-28 DIAGNOSIS — E210 Primary hyperparathyroidism: Secondary | ICD-10-CM | POA: Diagnosis not present

## 2020-12-28 DIAGNOSIS — D351 Benign neoplasm of parathyroid gland: Secondary | ICD-10-CM | POA: Diagnosis not present

## 2020-12-28 DIAGNOSIS — Z20822 Contact with and (suspected) exposure to covid-19: Secondary | ICD-10-CM | POA: Diagnosis not present

## 2020-12-28 DIAGNOSIS — Z79899 Other long term (current) drug therapy: Secondary | ICD-10-CM | POA: Diagnosis not present

## 2020-12-28 LAB — BASIC METABOLIC PANEL
Anion gap: 7 (ref 5–15)
BUN: 21 mg/dL (ref 8–23)
CO2: 29 mmol/L (ref 22–32)
Calcium: 9.9 mg/dL (ref 8.9–10.3)
Chloride: 103 mmol/L (ref 98–111)
Creatinine, Ser: 1.06 mg/dL (ref 0.61–1.24)
GFR, Estimated: >60 mL/min (ref >60)
Glucose, Bld: 137 mg/dL — ABNORMAL HIGH (ref 70–99)
Potassium: 3.5 mmol/L (ref 3.5–5.1)
Sodium: 139 mmol/L (ref 135–145)

## 2020-12-28 MED ORDER — TRAMADOL HCL 50 MG PO TABS
50.0000 mg | ORAL_TABLET | Freq: Four times a day (QID) | ORAL | 0 refills | Status: DC | PRN
  Filled 2020-12-28: qty 15, 2d supply, fill #0

## 2020-12-28 NOTE — Discharge Summary (Signed)
Physician Discharge Summary   Patient ID: ABBIE Spencer MRN: [id number] DOB/AGE: [birth year] 65 y.o.  Admit date: 12/27/2020  Discharge date: 12/28/2020  Discharge Diagnoses:  Principal Problem:   Hyperparathyroidism, primary [facility]) Active Problems:   Primary hyperparathyroidism [facility])   Discharged Condition: good  Hospital Course: Patient was admitted for observation following parathyroid surgery.  Post op course was uncomplicated.  Pain was well controlled.  Tolerated diet.  Post op calcium level on morning following surgery was 9.9 mg/dl.  Patient was prepared for discharge home on POD#1.   Consults: None  Treatments: surgery: neck exploration and biopsy of parathyroid glands  Discharge Exam: Blood pressure 134/66, pulse 64, temperature 98 F (36.7 C), temperature source Oral, resp. rate 18, height 5\' 8"  (1.727 m), weight 93.4 kg, SpO2 98 %. HEENT - clear Neck - wound dry and intact; mild ecchymosis; mild STS; voice normal   Disposition: Home  Discharge Instructions     Diet - low sodium heart healthy   Complete by: As directed    Increase activity slowly   Complete by: As directed    No dressing needed   Complete by: As directed       Allergies as of 12/28/2020   No Known Allergies      Medication List     TAKE these medications    amLODipine 10 MG tablet Commonly known as: NORVASC TAKE 1 TABLET BY MOUTH DAILY   Armodafinil 250 MG tablet Take 1 tablet by mouth daily What changed:  how much to take how to take this when to take this reasons to take this   carvedilol 25 MG tablet Commonly known as: COREG TAKE 1 TABLET BY MOUTH AT BEDTIME (TAKE 1 TABLET BY MOUTH AT BEDTIME)   CoQ10 100 MG Caps Take 100 mg by mouth daily.   fluticasone 50 MCG/ACT nasal spray Commonly known as: FLONASE Place 1 spray into both nostrils daily.   Ginkgo Biloba 500 MG Caps Take 500 mg by mouth daily.   glucosamine-chondroitin 500-400 MG tablet Take 1  tablet by mouth daily.   irbesartan-hydrochlorothiazide 300-12.5 MG tablet Commonly known as: AVALIDE TAKE 1 TABLET BY MOUTH EVERY MORNING   metoprolol succinate 100 MG 24 hr tablet Commonly known as: TOPROL-XL TAKE 1 TABLET BY MOUTH ONCE DAILY   multivitamin with minerals tablet Take 1 tablet by mouth daily.   Olopatadine HCl 0.2 % Soln Place 1 drop into both eyes daily.   Omega-3 1000 MG Caps Take 2,000 mg by mouth daily.   potassium chloride SA 20 MEQ tablet Commonly known as: KLOR-CON Take 20 mEq by mouth daily.   rosuvastatin 20 MG tablet Commonly known as: CRESTOR TAKE ONE TABLET BY MOUTH ONCE DAILY.   Systane 0.4-0.3 % Soln Generic drug: Polyethyl Glycol-Propyl Glycol Place 1 drop into both eyes daily.   traMADol 50 MG tablet Commonly known as: ULTRAM Take 1-2 tablets (50-100 mg total) by mouth every 6 (six) hours as needed for moderate pain.               Discharge Care Instructions  (From admission, onward)           Start     Ordered   12/28/20 0000  No dressing needed        12/28/20 0258            Follow-up Information     Armandina Gemma, MD. Schedule an appointment as soon as possible for a visit in 3 week(s).  Specialty: General Surgery Why: For wound re-check Contact information: [place] [address] [phone]                 Lenzie Sandler, [facility] Office: [phone]   Signed: Armandina Gemma 12/28/2020, 7:48 AM

## 2020-12-28 NOTE — Progress Notes (Signed)
Discharge instructions discussed with patient, verbalized agreement and understanding 

## 2020-12-28 NOTE — Discharge Instructions (Signed)
[facility] - Dr. Duey Liller  THYROID & PARATHYROID SURGERY:  POST-OP INSTRUCTIONS  Always review the instruction sheet provided by the hospital nurse at discharge.  A prescription for pain medication may be sent to your pharmacy at the time of discharge.  Take your pain medication as prescribed.  If narcotic pain medicine is not needed, then you may take acetaminophen (Tylenol) or ibuprofen (Advil) as needed for pain or soreness.  Take your normal home medications as prescribed unless otherwise directed.  If you need a refill on your pain medication, please contact the office during regular business hours.  Prescriptions will not be processed by the office after 5:00PM or on weekends.  Start with a light diet upon arrival home, such as soup and crackers or toast.  Be sure to drink plenty of fluids.  Resume your normal diet the day after surgery.  Most patients will experience some swelling and bruising on the chest and neck area.  Ice packs will help for the first 48 hours after arriving home.  Swelling and bruising will take several days to resolve.   It is common to experience some constipation after surgery.  Increasing fluid intake and taking a stool softener (Colace) will usually help to prevent this problem.  A mild laxative (Milk of Magnesia or Miralax) should be taken according to package directions if there has been no bowel movement after 48 hours.  Dermabond glue covers your incision. This seals the wound and you may shower at any time. The Dermabond will remain in place for about a week.  You may gradually remove the glue when it loosens around the edges.  If you need to loosen the Dermabond for removal, apply a layer of Vaseline to the wound for 15 minutes and then remove with a Kleenex. Your sutures are under the skin and will not show - they will dissolve on their own.  You may resume light daily activities beginning the day after discharge (such as self-care,  walking, climbing stairs), gradually increasing activities as tolerated. You may have sexual intercourse when it is comfortable. Refrain from any heavy lifting or straining until approved by your doctor. You may drive when you no longer are taking prescription pain medication, you can comfortably wear a seatbelt, and you can safely maneuver your car and apply the brakes.  You will see your doctor in the office for a follow-up appointment approximately three weeks after your surgery.  Make sure that you call for this appointment within a day or two after you arrive home to insure a convenient appointment time. Please have any requested laboratory tests performed a few days prior to your office visit so that the results will be available at your follow up appointment.  WHEN TO CALL THE [facility] OFFICE: -- Fever greater than 101.5 -- Inability to urinate -- Nausea and/or vomiting - persistent -- Extreme swelling or bruising -- Continued bleeding from incision -- Increased pain, redness, or drainage from the incision -- Difficulty swallowing or breathing -- Muscle cramping or spasms -- Numbness or tingling in hands or around lips  The clinic staff is available to answer your questions during regular business hours.  Please don't hesitate to call and ask to speak to one of the nurses if you have concerns.  [facility] OFFICE: [phone] (24 hours)  Please sign up for MyChart accounts. This will allow you to communicate directly with my nurse or myself without having to call the office. It will also allow you   to view your test results. You will need to enroll in MyChart for my office ([facility]) and for the hospital ([facility]).  Amylee Lodato, MD [facility] A [facility] practice 

## 2020-12-31 LAB — SURGICAL PATHOLOGY

## 2020-12-31 NOTE — Progress Notes (Signed)
Path results are as anticipated and as we discussed prior to discharge.  Will check labs before your next office visit.  tmg  Armandina Gemma, [facility] Office: [phone]

## 2021-01-02 ENCOUNTER — Telehealth: Payer: Self-pay

## 2021-01-02 NOTE — Telephone Encounter (Signed)
NOTES SCANNED TO REFERRAL 

## 2021-01-09 DIAGNOSIS — J310 Chronic rhinitis: Secondary | ICD-10-CM | POA: Diagnosis not present

## 2021-01-09 DIAGNOSIS — J343 Hypertrophy of nasal turbinates: Secondary | ICD-10-CM | POA: Diagnosis not present

## 2021-01-10 ENCOUNTER — Other Ambulatory Visit (HOSPITAL_COMMUNITY): Payer: Self-pay

## 2021-01-10 MED ORDER — METFORMIN HCL ER 500 MG PO TB24
ORAL_TABLET | ORAL | 2 refills | Status: DC
  Filled 2021-01-10: qty 90, 90d supply, fill #0

## 2021-01-15 ENCOUNTER — Other Ambulatory Visit (HOSPITAL_COMMUNITY): Payer: Self-pay

## 2021-01-15 MED ORDER — AMLODIPINE BESYLATE 10 MG PO TABS
10.0000 mg | ORAL_TABLET | Freq: Every day | ORAL | 3 refills | Status: DC
  Filled 2021-01-15: qty 90, 90d supply, fill #0

## 2021-01-27 ENCOUNTER — Other Ambulatory Visit (HOSPITAL_COMMUNITY): Payer: Self-pay

## 2021-01-28 ENCOUNTER — Other Ambulatory Visit (HOSPITAL_COMMUNITY): Payer: Self-pay

## 2021-01-28 ENCOUNTER — Other Ambulatory Visit (HOSPITAL_COMMUNITY): Payer: Self-pay | Admitting: Family Medicine

## 2021-01-28 DIAGNOSIS — R079 Chest pain, unspecified: Secondary | ICD-10-CM

## 2021-02-04 ENCOUNTER — Other Ambulatory Visit (HOSPITAL_COMMUNITY): Payer: Self-pay

## 2021-02-05 ENCOUNTER — Other Ambulatory Visit (HOSPITAL_COMMUNITY): Payer: Self-pay

## 2021-02-18 ENCOUNTER — Other Ambulatory Visit (HOSPITAL_COMMUNITY): Payer: Self-pay

## 2021-03-12 DIAGNOSIS — E213 Hyperparathyroidism, unspecified: Secondary | ICD-10-CM | POA: Diagnosis not present

## 2021-03-21 DIAGNOSIS — E892 Postprocedural hypoparathyroidism: Secondary | ICD-10-CM | POA: Diagnosis not present

## 2021-03-22 ENCOUNTER — Encounter: Payer: Self-pay | Admitting: Cardiovascular Disease

## 2021-03-22 ENCOUNTER — Other Ambulatory Visit: Payer: Self-pay

## 2021-03-22 ENCOUNTER — Ambulatory Visit (INDEPENDENT_AMBULATORY_CARE_PROVIDER_SITE_OTHER): Payer: PPO | Admitting: Cardiovascular Disease

## 2021-03-22 DIAGNOSIS — I10 Essential (primary) hypertension: Secondary | ICD-10-CM

## 2021-03-22 DIAGNOSIS — E782 Mixed hyperlipidemia: Secondary | ICD-10-CM | POA: Diagnosis not present

## 2021-03-22 DIAGNOSIS — R0609 Other forms of dyspnea: Secondary | ICD-10-CM | POA: Diagnosis not present

## 2021-03-22 DIAGNOSIS — Z9989 Dependence on other enabling machines and devices: Secondary | ICD-10-CM | POA: Diagnosis not present

## 2021-03-22 DIAGNOSIS — G4733 Obstructive sleep apnea (adult) (pediatric): Secondary | ICD-10-CM

## 2021-03-22 NOTE — Assessment & Plan Note (Signed)
History of dyspnea on exertion primarily when exercising which is unusual for him.  He does have positive cardiac risk factors.  I am going to get a 2D echo and a coronary CTA to further evaluate.

## 2021-03-22 NOTE — Assessment & Plan Note (Signed)
History of essential hypertension with blood pressure measured today 140/76.  He is on amlodipine, carvedilol and metoprolol as well as Avalide.

## 2021-03-22 NOTE — Patient Instructions (Addendum)
Medication Instructions:  Your physician recommends that you continue on your current medications as directed. Please refer to the Current Medication list given to you today.  *If you need a refill on your cardiac medications before your next appointment, please call your pharmacy*   Lab Work: Your physician recommends that you return for lab work in: BMET within 7 days of coronary CTA  If you have labs (blood work) drawn today and your tests are completely normal, you will receive your results only by: [place] (if you have MyChart) OR A paper copy in the mail If you have any lab test that is abnormal or we need to change your treatment, we will call you to review the results.   Testing/Procedures: Your physician has requested that you have an echocardiogram. Echocardiography is a painless test that uses sound waves to create images of your heart. It provides your doctor with information about the size and shape of your heart and how well your hearts chambers and valves are working. This procedure takes approximately one hour. There are no restrictions for this procedure. This procedure is done at [address].    Follow-Up: At [facility], you and your health needs are our priority.  As part of our continuing mission to provide you with exceptional heart care, we have created designated Provider Care Teams.  These Care Teams include your primary Cardiologist (physician) and Advanced Practice Providers (APPs -  Physician Assistants and Nurse Practitioners) who all work together to provide you with the care you need, when you need it.  We recommend signing up for the patient portal called "MyChart".  Sign up information is provided on this After Visit Summary.  MyChart is used to connect with patients for Virtual Visits (Telemedicine).  Patients are able to view lab/test results, encounter notes, upcoming appointments, etc.  Non-urgent messages can be sent to your provider as  well.   To learn more about what you can do with MyChart, go to [email or web address].    Your next appointment:   We will see you on an as needed basis.  Provider:   Quay Burow, MD   Other Instructions   Your cardiac CT will be scheduled at one of the below locations:   [facility] [address] [place], [place] [address] [phone]   If scheduled at [facility], please arrive at the [facility] main entrance (entrance A) of [facility] 30 minutes prior to test start time. You can use the FREE valet parking offered at the main entrance (encouraged to control the heart rate for the test) Proceed to the [facility] Radiology Department (first floor) to check-in and test prep.   Please follow these instructions carefully (unless otherwise directed):  Hold all erectile dysfunction medications at least 3 days (72 hrs) prior to test.  On the Night Before the Test: Be sure to Drink plenty of water. Do not consume any caffeinated/decaffeinated beverages or chocolate 12 hours prior to your test. Do not take any antihistamines 12 hours prior to your test.  On the Day of the Test: Drink plenty of water until 1 hour prior to the test. Do not eat any food 4 hours prior to the test. You may take your regular medications prior to the test.  Take metoprolol (Lopressor) two hours prior to test. HOLD Furosemide/Hydrochlorothiazide morning of the test. FEMALES- please wear underwire-free bra if available, avoid dresses & tight clothing  After the Test: Drink plenty of water. After receiving IV contrast, you may experience a mild flushed feeling. This is normal. On occasion, you may experience a mild rash up to 24 hours after the test. This is not dangerous. If this occurs, you can take Benadryl 25 mg and increase your fluid intake. If you experience trouble breathing, this can be serious. If it is severe call 911 IMMEDIATELY. If it is mild,  please call our office. If you take any of these medications: Glipizide/Metformin, Avandament, Glucavance, please do not take 48 hours after completing test unless otherwise instructed.  Please allow 2-4 weeks for scheduling of routine cardiac CTs. Some insurance companies require a pre-authorization which may delay scheduling of this test.   For non-scheduling related questions, please contact the cardiac imaging nurse navigator should you have any questions/concerns: Marchia Bond, Cardiac Imaging Nurse Navigator Gordy Clement, Cardiac Imaging Nurse Navigator [facility] Direct Office Dial: [phone]   For scheduling needs, including cancellations and rescheduling, please call [place], [phone].

## 2021-03-22 NOTE — Assessment & Plan Note (Signed)
History of hyperlipidemia and hypertriglyceridemia on Crestor and Lovaza with recent lab work performed by his PCP revealing total cholesterol 134, LDL 65 and HDL 37 and triglyceride level of 158 which was excellent.

## 2021-03-22 NOTE — Assessment & Plan Note (Signed)
History of obstructive sleep apnea on CPAP. 

## 2021-03-22 NOTE — Progress Notes (Signed)
03/22/2021 Alan Spencer   06-Jun-1955  [id number]  Primary Physician Alan Lopes, MD Primary Cardiologist: Alan Harp MD Alan Spencer, [place]  HPI:  Alan Spencer is a 66 y.o. mildly overweight married Caucasian male with no children who is a Environmental education officer at [employer].  He was referred by Dr. Inocencio Spencer, his cardiologist, for cardiovascular evaluation because of dyspnea.  His risk factors include treated hypertension hyperlipidemia.  There is no family history of heart disease.  Is never had a heart attack or stroke.  Denies chest pain but does get some dyspnea when exercising especially when playing tennis.  He does have sleep apnea on CPAP.   Current Meds  Medication Sig   amLODipine (NORVASC) 10 MG tablet TAKE 1 TABLET BY MOUTH DAILY   Armodafinil 250 MG tablet Take 1 tablet by mouth daily (Patient taking differently: Take 125 mg by mouth daily as needed (for wakefullness).)   carvedilol (COREG) 25 MG tablet TAKE 1 TABLET BY MOUTH AT BEDTIME   Coenzyme Q10 (COQ10) 100 MG CAPS Take 100 mg by mouth daily.   fluticasone (FLONASE) 50 MCG/ACT nasal spray Place 1 spray into both nostrils daily.   Ginkgo Biloba 500 MG CAPS Take 500 mg by mouth daily.   glucosamine-chondroitin 500-400 MG tablet Take 1 tablet by mouth daily.   irbesartan-hydrochlorothiazide (AVALIDE) 300-12.5 MG tablet TAKE ONE TABLET BY MOUTH EVERY MORNING   irbesartan-hydrochlorothiazide (AVALIDE) 300-12.5 MG tablet TAKE 1 TABLET BY MOUTH EVERY MORNING   metoprolol succinate (TOPROL-XL) 100 MG 24 hr tablet TAKE 1 TABLET BY MOUTH ONCE DAILY   Multiple Vitamins-Minerals (MULTIVITAMIN WITH MINERALS) tablet Take 1 tablet by mouth daily.   Olopatadine HCl 0.2 % SOLN Place 1 drop into both eyes daily.   omega-3 acid ethyl esters (LOVAZA) 1 g capsule Take 2 g by mouth 2 (two) times daily.   Polyethyl Glycol-Propyl Glycol (SYSTANE) 0.4-0.3 % SOLN Place 1 drop into both eyes daily.    potassium chloride SA (K-DUR,KLOR-CON) 20 MEQ tablet Take 20 mEq by mouth daily.   rosuvastatin (CRESTOR) 20 MG tablet TAKE ONE TABLET BY MOUTH ONCE DAILY.   traMADol (ULTRAM) 50 MG tablet Take 1-2 tablets (50-100 mg total) by mouth every 6 (six) hours as needed for moderate pain.   [DISCONTINUED] amLODipine (NORVASC) 10 MG tablet TAKE 1 TABLET BY MOUTH DAILY   [DISCONTINUED] Omega-3 1000 MG CAPS Take 2,000 mg by mouth daily.     No Known Allergies  Social History   Socioeconomic History   Marital status: Married    Spouse name: Not on file   Number of children: Not on file   Years of education: Not on file   Highest education level: Not on file  Occupational History   Not on file  Tobacco Use   Smoking status: Never   Smokeless tobacco: Never  Vaping Use   Vaping Use: Never used  Substance and Sexual Activity   Alcohol use: Yes    Comment: occas.   Drug use: No   Sexual activity: Not on file  Other Topics Concern   Not on file  Social History Narrative   Not on file   Social Determinants of Health   Financial Resource Strain: Not on file  Food Insecurity: Not on file  Transportation Needs: Not on file  Physical Activity: Not on file  Stress: Not on file  Social Connections: Not on file  Intimate Partner Violence: Not on file  Review of Systems: General: negative for chills, fever, night sweats or weight changes.  Cardiovascular: negative for chest pain, dyspnea on exertion, edema, orthopnea, palpitations, paroxysmal nocturnal dyspnea or shortness of breath Dermatological: negative for rash Respiratory: negative for cough or wheezing Urologic: negative for hematuria Abdominal: negative for nausea, vomiting, diarrhea, bright red blood per rectum, melena, or hematemesis Neurologic: negative for visual changes, syncope, or dizziness All other systems reviewed and are otherwise negative except as noted above.    Blood pressure 140/76, pulse (!) 53, height 5\' 8"   (1.727 m), weight 203 lb (92.1 kg).  General appearance: alert and no distress Neck: no adenopathy, no carotid bruit, no JVD, supple, symmetrical, trachea midline, and thyroid not enlarged, symmetric, no tenderness/mass/nodules Lungs: clear to auscultation bilaterally Heart: regular rate and rhythm, S1, S2 normal, no murmur, click, rub or gallop Extremities: extremities normal, atraumatic, no cyanosis or edema Pulses: 2+ and symmetric Skin: Skin color, texture, turgor normal. No rashes or lesions Neurologic: Grossly normal  EKG sinus bradycardia 53 with left anterior fascicular block and minimal voltage for LVH.  I personally reviewed this EKG.  ASSESSMENT AND PLAN:   Essential hypertension, benign History of essential hypertension with blood pressure measured today 140/76.  He is on amlodipine, carvedilol and metoprolol as well as Avalide.  Mixed hyperlipidemia History of hyperlipidemia and hypertriglyceridemia on Crestor and Lovaza with recent lab work performed by his PCP revealing total cholesterol 134, LDL 65 and HDL 37 and triglyceride level of 158 which was excellent.  OSA on CPAP History of obstructive sleep apnea on CPAP  Dyspnea on exertion History of dyspnea on exertion primarily when exercising which is unusual for him.  He does have positive cardiac risk factors.  I am going to get a 2D echo and a coronary CTA to further evaluate.     Alan Harp MD FACP,FACC,FAHA, [facility] 03/22/2021 11:17 AM

## 2021-03-30 ENCOUNTER — Other Ambulatory Visit (HOSPITAL_COMMUNITY): Payer: Self-pay

## 2021-03-30 MED FILL — Metoprolol Succinate Tab ER 24HR 100 MG (Tartrate Equiv): ORAL | 90 days supply | Qty: 90 | Fill #2 | Status: AC

## 2021-04-01 ENCOUNTER — Other Ambulatory Visit (HOSPITAL_COMMUNITY): Payer: Self-pay

## 2021-04-19 ENCOUNTER — Other Ambulatory Visit: Payer: Self-pay

## 2021-04-19 ENCOUNTER — Ambulatory Visit (HOSPITAL_COMMUNITY): Payer: PPO | Attending: Cardiovascular Disease

## 2021-04-19 DIAGNOSIS — Z9989 Dependence on other enabling machines and devices: Secondary | ICD-10-CM

## 2021-04-19 DIAGNOSIS — I10 Essential (primary) hypertension: Secondary | ICD-10-CM | POA: Diagnosis not present

## 2021-04-19 DIAGNOSIS — E782 Mixed hyperlipidemia: Secondary | ICD-10-CM | POA: Insufficient documentation

## 2021-04-19 DIAGNOSIS — G4733 Obstructive sleep apnea (adult) (pediatric): Secondary | ICD-10-CM | POA: Diagnosis not present

## 2021-04-19 DIAGNOSIS — R0609 Other forms of dyspnea: Secondary | ICD-10-CM

## 2021-04-19 LAB — ECHOCARDIOGRAM COMPLETE
Area-P 1/2: 2.54 cm2
S' Lateral: 2.95 cm

## 2021-04-23 ENCOUNTER — Telehealth (HOSPITAL_COMMUNITY): Payer: Self-pay | Admitting: *Deleted

## 2021-04-23 NOTE — Telephone Encounter (Signed)
Reaching out to patient to offer assistance regarding upcoming cardiac imaging study; pt verbalizes understanding of appt date/time, parking situation and where to check in, pre-test NPO status and verified current allergies; name and call back number provided for further questions should they arise  Gordy Clement RN Navigator Cardiac Imaging [facility] [phone] office [phone] cell  Patient to take his daily medications for test except his Avalide. He will get labs prior to his cardiac CT and is aware to arrive at 10:30am for his 11am scan.

## 2021-04-26 ENCOUNTER — Ambulatory Visit (HOSPITAL_COMMUNITY)
Admission: RE | Admit: 2021-04-26 | Discharge: 2021-04-26 | Disposition: A | Discharge status: Home / Self Care | Payer: PPO | Source: Ambulatory Visit | Attending: Cardiovascular Disease | Admitting: Cardiovascular Disease

## 2021-04-26 ENCOUNTER — Other Ambulatory Visit: Payer: Self-pay

## 2021-04-26 DIAGNOSIS — I700 Atherosclerosis of aorta: Secondary | ICD-10-CM | POA: Insufficient documentation

## 2021-04-26 DIAGNOSIS — I2510 Atherosclerotic heart disease of native coronary artery without angina pectoris: Secondary | ICD-10-CM | POA: Insufficient documentation

## 2021-04-26 DIAGNOSIS — R0609 Other forms of dyspnea: Secondary | ICD-10-CM | POA: Diagnosis not present

## 2021-04-26 DIAGNOSIS — R9430 Abnormal result of cardiovascular function study, unspecified: Secondary | ICD-10-CM | POA: Diagnosis not present

## 2021-04-26 DIAGNOSIS — E213 Hyperparathyroidism, unspecified: Secondary | ICD-10-CM | POA: Diagnosis not present

## 2021-04-26 MED ORDER — NITROGLYCERIN 0.4 MG SL SUBL
0.8000 mg | SUBLINGUAL_TABLET | Freq: Once | SUBLINGUAL | Status: AC
  Administered 2021-04-26: 0.8 mg via SUBLINGUAL

## 2021-04-26 MED ORDER — NITROGLYCERIN 0.4 MG SL SUBL
SUBLINGUAL_TABLET | SUBLINGUAL | Status: AC
  Filled 2021-04-26: qty 2

## 2021-04-26 MED ORDER — IOHEXOL 350 MG/ML SOLN
100.0000 mL | Freq: Once | INTRAVENOUS | Status: AC | PRN
  Administered 2021-04-26: 100 mL via INTRAVENOUS

## 2021-04-28 ENCOUNTER — Other Ambulatory Visit (HOSPITAL_COMMUNITY): Payer: Self-pay

## 2021-04-29 ENCOUNTER — Other Ambulatory Visit (HOSPITAL_COMMUNITY): Payer: Self-pay

## 2021-04-29 MED ORDER — CARVEDILOL 25 MG PO TABS
25.0000 mg | ORAL_TABLET | Freq: Every day | ORAL | 2 refills | Status: DC
  Filled 2021-04-29: qty 90, 90d supply, fill #0
  Filled 2021-07-21: qty 90, 90d supply, fill #1

## 2021-05-09 ENCOUNTER — Other Ambulatory Visit (HOSPITAL_COMMUNITY): Payer: Self-pay

## 2021-05-09 MED ORDER — AMLODIPINE BESYLATE 10 MG PO TABS
ORAL_TABLET | ORAL | 3 refills | Status: DC
  Filled 2021-05-09: qty 90, 90d supply, fill #0
  Filled 2021-08-12: qty 90, 90d supply, fill #1
  Filled 2021-11-07: qty 90, 90d supply, fill #2
  Filled 2022-02-04: qty 90, 90d supply, fill #3

## 2021-05-13 ENCOUNTER — Other Ambulatory Visit (HOSPITAL_COMMUNITY): Payer: Self-pay

## 2021-05-14 ENCOUNTER — Other Ambulatory Visit (HOSPITAL_COMMUNITY): Payer: Self-pay

## 2021-05-16 DIAGNOSIS — D2262 Melanocytic nevi of left upper limb, including shoulder: Secondary | ICD-10-CM | POA: Diagnosis not present

## 2021-05-16 DIAGNOSIS — D225 Melanocytic nevi of trunk: Secondary | ICD-10-CM | POA: Diagnosis not present

## 2021-05-16 DIAGNOSIS — L111 Transient acantholytic dermatosis [Grover]: Secondary | ICD-10-CM | POA: Diagnosis not present

## 2021-05-16 DIAGNOSIS — I788 Other diseases of capillaries: Secondary | ICD-10-CM | POA: Diagnosis not present

## 2021-05-16 DIAGNOSIS — L570 Actinic keratosis: Secondary | ICD-10-CM | POA: Diagnosis not present

## 2021-05-16 DIAGNOSIS — D2261 Melanocytic nevi of right upper limb, including shoulder: Secondary | ICD-10-CM | POA: Diagnosis not present

## 2021-05-16 DIAGNOSIS — D2271 Melanocytic nevi of right lower limb, including hip: Secondary | ICD-10-CM | POA: Diagnosis not present

## 2021-05-27 DIAGNOSIS — G4733 Obstructive sleep apnea (adult) (pediatric): Secondary | ICD-10-CM | POA: Diagnosis not present

## 2021-06-14 ENCOUNTER — Encounter: Payer: Self-pay | Admitting: Cardiovascular Disease

## 2021-06-14 ENCOUNTER — Ambulatory Visit: Payer: PPO | Admitting: Cardiovascular Disease

## 2021-06-14 VITALS — BP 160/72 | HR 84 | Ht 68.0 in | Wt 210.2 lb

## 2021-06-14 DIAGNOSIS — R0609 Other forms of dyspnea: Secondary | ICD-10-CM

## 2021-06-14 DIAGNOSIS — I10 Essential (primary) hypertension: Secondary | ICD-10-CM | POA: Diagnosis not present

## 2021-06-14 NOTE — Progress Notes (Signed)
Alan Spencer returns today for follow-up of his tests and evaluation of shortness of breath.  He had a 2D echocardiogram that revealed normal LV function with an inducible subaortic gradient and basal septal hypertrophy.  He is by atrial enlargement without significant valvular abnormalities otherwise.  I am concerned that his shortness of breath is related to this and number referring him to Dr.Chandrasekhar for further evaluation and treatment of this.  He also has resistant hypertension with blood pressures in the 1 60-1 70 range despite being on multiple antihypertensive medications.  He does limit his salt.  I am going to get renal Doppler studies and refer him to Dr. Blenda [facility] hypertension clinic.  I did obtain a coronary calcium score on him as well which was 40.  His most recent lipid profile performed 03/12/2021 revealed total cholesterol 134, LDL 65 and HDL of 37.  I will see him back in 6 months for follow-up. ? ?Lorretta Harp, M.D., [place], [facility], [place], [place] ?[place] ?[address] ?[place], [place]  [address]  ?[phone] ?06/14/2021 ?10:11 AM  ?

## 2021-06-14 NOTE — Patient Instructions (Signed)
Medication Instructions:  ?Your physician recommends that you continue on your current medications as directed. Please refer to the Current Medication list given to you today. ? ?*If you need a refill on your cardiac medications before your next appointment, please call your pharmacy* ? ? ?Testing/Procedures: ?Your physician has requested that you have a renal artery duplex. During this test, an ultrasound is used to evaluate blood flow to the kidneys. Allow one hour for this exam. Do not eat after midnight the day before and avoid carbonated beverages. Take your medications as you usually do. ?This procedure will be done at [place] ? ? ?Follow-Up: ?At [facility], you and your health needs are our priority.  As part of our continuing mission to provide you with exceptional heart care, we have created designated Provider Care Teams.  These Care Teams include your primary Cardiologist (physician) and Advanced Practice Providers (APPs -  Physician Assistants and Nurse Practitioners) who all work together to provide you with the care you need, when you need it. ? ?We recommend signing up for the patient portal called "MyChart".  Sign up information is provided on this After Visit Summary.  MyChart is used to connect with patients for Virtual Visits (Telemedicine).  Patients are able to view lab/test results, encounter notes, upcoming appointments, etc.  Non-urgent messages can be sent to your provider as well.   ?To learn more about what you can do with MyChart, go to [email or web address].   ? ?Your next appointment:   ?6 month(s) ? ?The format for your next appointment:   ?In Person ? ?Provider:   ?Quay Burow, MD ?

## 2021-07-02 ENCOUNTER — Ambulatory Visit (HOSPITAL_COMMUNITY)
Admission: RE | Admit: 2021-07-02 | Discharge: 2021-07-02 | Disposition: A | Discharge status: Home / Self Care | Payer: PPO | Source: Ambulatory Visit | Attending: Cardiology | Admitting: Cardiology

## 2021-07-02 DIAGNOSIS — I10 Essential (primary) hypertension: Secondary | ICD-10-CM | POA: Insufficient documentation

## 2021-07-02 DIAGNOSIS — R0609 Other forms of dyspnea: Secondary | ICD-10-CM | POA: Diagnosis not present

## 2021-07-06 ENCOUNTER — Other Ambulatory Visit (HOSPITAL_COMMUNITY): Payer: Self-pay

## 2021-07-10 ENCOUNTER — Other Ambulatory Visit (HOSPITAL_COMMUNITY): Payer: Self-pay

## 2021-07-10 MED ORDER — METOPROLOL SUCCINATE ER 100 MG PO TB24
100.0000 mg | ORAL_TABLET | Freq: Every day | ORAL | 3 refills | Status: DC
  Filled 2021-07-10: qty 90, 90d supply, fill #0

## 2021-07-11 ENCOUNTER — Telehealth: Payer: Self-pay | Admitting: Cardiovascular Disease

## 2021-07-11 ENCOUNTER — Other Ambulatory Visit: Payer: Self-pay | Admitting: *Deleted

## 2021-07-11 DIAGNOSIS — N281 Cyst of kidney, acquired: Secondary | ICD-10-CM

## 2021-07-11 NOTE — Telephone Encounter (Signed)
? ?  Pt is returning call to get vas result  ?

## 2021-07-11 NOTE — Telephone Encounter (Signed)
No evid of RAS. Avascular cyste Right kidney. Please get an abd MRI to assess ?

## 2021-07-11 NOTE — Telephone Encounter (Signed)
Pt aware and agrees with plan ./cy ?

## 2021-07-22 ENCOUNTER — Other Ambulatory Visit (HOSPITAL_COMMUNITY): Payer: Self-pay

## 2021-07-28 NOTE — Progress Notes (Signed)
?Cardiology Office Note:   ? ?Date:  07/30/2021  ? ?ID:  Alan Spencer, DOB 1956/01/05, MRN [id number] ? ?PCP:  Donnajean Lopes, MD ?  ?[facility] Providers ?Cardiologist:  None    ? ?Referring MD: Lorretta Harp, MD  ? ?CC: Consultation for hypertrophic cardiomyopathy (vs hypertrophic phenotype with [facility] physiology) ? ?History of Present Illness:   ? ?Alan Spencer is a 66 y.o. male with a hx of HCM and uncontrolled HTN who presents for evaluation 07/30/21. ?Echo 04/19/21 with LVOT gradient with Valsalva of 42 mm Hg. ? ? ?Notes the feels ok but feels a bit more fatigued that he used on.  Feels more shortness of breath when playing tennis. ?Plays tennis [employer].   ?Is able to play 3 sets as doubles. ? ?Notes rare palpitations ?Notes no CP. ?Notes rare  Dizziness when standing up too quickly. ?Falls more often when pivoting quickly (loss of balance between shots) ?Notes no syncope. ?Stays hydrated well ?Notable family events include a mother with heart problems; she is a Marine scientist and had syncope but they are not sure what this is.  She feels near syncope with large meals. ?Father had severe AS and deferred TAVR secondary to CKD III ?One sister with no heart problems. ? ?He notes that he felt more fatigue with Coreg; presently he takes coreg in the PM and metoprolol in the AM. ? ?He notes he had a kidney lesion that was found and it pending MRI; though it was thought this might be a cyst ? ? ?Past Medical History:  ?Diagnosis Date  ? Anxiety   ? Essential hypertension, benign   ? Excessive daytime sleepiness 07/03/2015  ? Family history of colon cancer   ? HNP (herniated nucleus pulposus), lumbar   ? L4-L5  ? Mixed hyperlipidemia   ? Obesity (BMI 30-39.9) 01/10/2018  ? Rectal bleeding   ? Sleep apnea   ? uses CPAP  ? ? ?Past Surgical History:  ?Procedure Laterality Date  ? broken left elbow    ? NASAL SEPTUM SURGERY  2022  ? PARATHYROIDECTOMY N/A 12/27/2020  ? Procedure: NECK EXPLORATION WITH BIOPSY OF PARATHYROID  GLAND x3;  Surgeon: Armandina Gemma, MD;  Location: [facility];  Service: General;  Laterality: N/A;  ? TONSILLECTOMY    ? ? ?Current Medications: ?Current Meds  ?Medication Sig  ? amLODipine (NORVASC) 10 MG tablet Take 1 tablet by mouth once daily  ? Armodafinil 250 MG tablet Take 1 tablet by mouth daily (Patient taking differently: Take 125 mg by mouth daily as needed (for wakefullness).)  ? Coenzyme Q10 (COQ10) 100 MG CAPS Take 100 mg by mouth daily.  ? fluticasone (FLONASE) 50 MCG/ACT nasal spray Place 1 spray into both nostrils daily.  ? Ginkgo Biloba 500 MG CAPS Take 500 mg by mouth daily.  ? glucosamine-chondroitin 500-400 MG tablet Take 1 tablet by mouth daily.  ? irbesartan-hydrochlorothiazide (AVALIDE) 300-12.5 MG tablet TAKE ONE TABLET BY MOUTH EVERY MORNING  ? metoprolol (TOPROL XL) 200 MG 24 hr tablet Take 1 tablet by mouth daily.  ? Multiple Vitamins-Minerals (MULTIVITAMIN WITH MINERALS) tablet Take 1 tablet by mouth daily.  ? Olopatadine HCl 0.2 % SOLN Place 1 drop into both eyes daily.  ? omega-3 acid ethyl esters (LOVAZA) 1 g capsule Take 2 g by mouth 2 (two) times daily.  ? Polyethyl Glycol-Propyl Glycol (SYSTANE) 0.4-0.3 % SOLN Place 1 drop into both eyes daily.  ? potassium chloride SA (K-DUR,KLOR-CON) 20 MEQ tablet Take 20  mEq by mouth daily.  ? rosuvastatin (CRESTOR) 20 MG tablet TAKE ONE TABLET BY MOUTH ONCE DAILY.  ? spironolactone (ALDACTONE) 25 MG tablet Take 1 tablet by mouth daily.  ? traMADol (ULTRAM) 50 MG tablet Take 1-2 tablets (50-100 mg total) by mouth every 6 (six) hours as needed for moderate pain.  ? triamcinolone cream (KENALOG) 0.1 % as needed for rash.  ? [DISCONTINUED] carvedilol (COREG) 25 MG tablet TAKE 1 TABLET BY MOUTH AT BEDTIME  ? [DISCONTINUED] irbesartan-hydrochlorothiazide (AVALIDE) 300-12.5 MG tablet TAKE 1 TABLET BY MOUTH EVERY MORNING  ? [DISCONTINUED] metoprolol succinate (TOPROL-XL) 100 MG 24 hr tablet TAKE 1 TABLET BY MOUTH ONCE DAILY  ?  ? ?Allergies:   Patient has  no known allergies.  ? ?Social History  ? ?Socioeconomic History  ? Marital status: Married  ?  Spouse name: Not on file  ? Number of children: Not on file  ? Years of education: Not on file  ? Highest education level: Not on file  ?Occupational History  ? Not on file  ?Tobacco Use  ? Smoking status: Never  ? Smokeless tobacco: Never  ?Vaping Use  ? Vaping Use: Never used  ?Substance and Sexual Activity  ? Alcohol use: Yes  ?  Comment: occas.  ? Drug use: No  ? Sexual activity: Not on file  ?Other Topics Concern  ? Not on file  ?Social History Narrative  ? Not on file  ? ?Social Determinants of Health  ? ?Financial Resource Strain: Not on file  ?Food Insecurity: Not on file  ?Transportation Needs: Not on file  ?Physical Activity: Not on file  ?Stress: Not on file  ?Social Connections: Not on file  ?  ? ?Family History: ?The patient's family history includes Colon cancer in his paternal grandmother; Hypertension in his mother; Kidney failure in his father. ? ?ROS:   ?Please see the history of present illness.    ? All other systems reviewed and are negative. ? ?EKGs/Labs/Other Studies Reviewed:   ? ?The following studies were reviewed today: ? ?EKG:  EKG is  ordered today.  The ekg ordered today demonstrates  ?03/22/21: Sinus bradycardia with 1st heart with LVH ? ?Transthoracic Echocardiogram: ?Date: 04/19/20 ?Results: ? 1. Moderate hypertophy of the basal septum with otherwise mild concentric  ?LVH. Mild intracavitary gradient. Peak velocity 1.6 m/s. Peak gradient 10  ?mmHg. Velocity increases to 3.2 m/s and peak gradient increases to 41.2  ?mmHg. Left ventricular ejection  ?fraction, by estimation, is 70 to 75%. The left ventricle has hyperdynamic  ?function. The left ventricle has no regional wall motion abnormalities.  ?There is moderate left ventricular hypertrophy of the basal-septal  ?segment. Left ventricular diastolic  ?parameters are consistent with Grade I diastolic dysfunction (impaired  ?relaxation). The  average left ventricular global longitudinal strain is  ?-20.4 %. The global longitudinal strain is normal.  ? 2. Right ventricular systolic function is normal. The right ventricular  ?size is mildly enlarged. There is normal pulmonary artery systolic  ?pressure.  ? 3. Left atrial size was severely dilated.  ? 4. Right atrial size was severely dilated.  ? 5. The mitral valve is normal in structure. Trivial mitral valve  ?regurgitation. No evidence of mitral stenosis.  ? 6. The aortic valve is tricuspid. There is mild calcification of the  ?aortic valve. There is mild thickening of the aortic valve. Aortic valve  ?regurgitation is not visualized. No aortic stenosis is present.  ? 7. Aortic dilatation noted. There is mild  dilatation of the ascending  ?aorta, measuring 37 mm.  ? 8. The inferior vena cava is dilated in size with >50% respiratory  ?variability, suggesting right atrial pressure of 8 mmHg.  ? ? ?Recent Labs: ?12/21/2020: Hemoglobin 15.7; Platelets 193 ?12/28/2020: BUN 21; Creatinine, Ser 1.06; Potassium 3.5; Sodium 139  ?Recent Lipid Panel ?No results found for: CHOL, TRIG, HDL, CHOLHDL, VLDL, LDLCALC, LDLDIRECT ? ? ?    ? ?Physical Exam:   ? ?VS:  BP (!) 160/68   Pulse (!) 52   Ht '5\' 8"'$  (1.727 m)   Wt 207 lb (93.9 kg)   SpO2 98%   BMI 31.47 kg/m?    ? ?Wt Readings from Last 3 Encounters:  ?07/30/21 207 lb (93.9 kg)  ?06/14/21 210 lb 3.2 oz (95.3 kg)  ?03/22/21 203 lb (92.1 kg)  ?  ? ?Gen: no distress ?Neck: No JVD, no carotid bruit ?Cardiac: No Rubs or Gallops, systolic Murmur worst with standing and Valsalva, regular bradycardia, +2 radial pulses ?Respiratory: Clear to auscultation bilaterally, normal effort, normal  respiratory rate ?GI: Soft, nontender, non-distended  ?MS: +1 ankle edema L > R;  moves all extremities ?Integument: Skin feels warm ?Neuro:  At time of evaluation, alert and oriented to person/place/time/situation  ?Psych: Normal affect, patient feels well ? ? ?ASSESSMENT:   ? ?1.  Hypertensive heart disease without heart failure   ?2. HOCM (hypertrophic obstructive cardiomyopathy) ([place])   ? ?PLAN:   ? ?Hypertrophic Cardiomyopathy vs Hypertensive phenocopy ?- peak gradient  42 mm Hg on rest e

## 2021-07-30 ENCOUNTER — Ambulatory Visit: Payer: PPO | Admitting: Internal Medicine

## 2021-07-30 ENCOUNTER — Encounter: Payer: Self-pay | Admitting: Internal Medicine

## 2021-07-30 ENCOUNTER — Other Ambulatory Visit (HOSPITAL_COMMUNITY): Payer: Self-pay

## 2021-07-30 VITALS — BP 160/68 | HR 52 | Ht 68.0 in | Wt 207.0 lb

## 2021-07-30 DIAGNOSIS — I119 Hypertensive heart disease without heart failure: Secondary | ICD-10-CM

## 2021-07-30 DIAGNOSIS — I421 Obstructive hypertrophic cardiomyopathy: Secondary | ICD-10-CM | POA: Diagnosis not present

## 2021-07-30 MED ORDER — SPIRONOLACTONE 25 MG PO TABS
25.0000 mg | ORAL_TABLET | Freq: Every day | ORAL | 3 refills | Status: DC
  Filled 2021-07-30: qty 90, 90d supply, fill #0

## 2021-07-30 MED ORDER — METOPROLOL SUCCINATE ER 200 MG PO TB24
200.0000 mg | ORAL_TABLET | Freq: Every day | ORAL | 3 refills | Status: DC
  Filled 2021-07-30: qty 90, 90d supply, fill #0
  Filled 2021-10-23: qty 90, 90d supply, fill #1
  Filled 2022-01-21: qty 90, 90d supply, fill #2
  Filled 2022-04-21: qty 90, 90d supply, fill #3

## 2021-07-30 NOTE — Patient Instructions (Addendum)
Medication Instructions:  ?Your physician has recommended you make the following change in your medication:  ?STOP: Carvedilol (Coreg) ?START: Spironolactone (Aldactone) 25 mg by mouth once daily ?INCREASE: metoprolol succinate to 200 mg by mouth once daily ? ?*If you need a refill on your cardiac medications before your next appointment, please call your pharmacy* ? ? ?Lab Work: ?Today: Aldosterone renin, CBC ?IN 1 WEEK: BMP ?If you have labs (blood work) drawn today and your tests are completely normal, you will receive your results only by: ?MyChart Message (if you have MyChart) OR ?A paper copy in the mail ?If you have any lab test that is abnormal or we need to change your treatment, we will call you to review the results. ? ? ?Testing/Procedures: ?Your physician has requested that you have a cardiac MRI. Cardiac MRI uses a computer to create images of your heart as its beating, producing both still and moving pictures of your heart and major blood vessels. Please follow the instruction sheet given to you today for more information.  ? ? ?Follow-Up: ?At [facility], you and your health needs are our priority.  As part of our continuing mission to provide you with exceptional heart care, we have created designated Provider Care Teams.  These Care Teams include your primary Cardiologist (physician) and Advanced Practice Providers (APPs -  Physician Assistants and Nurse Practitioners) who all work together to provide you with the care you need, when you need it. ? ?Your next appointment:   ?5 month(s) ? ?The format for your next appointment:   ?In Person ? ?Provider:   ?Rudean Haskell, MD:1}  ? ?Other Instructions ? ? ?You are scheduled for Cardiac MRI on ______________. Please arrive for your appointment at ______________ ( arrive 30-45 minutes prior to test start time). ? ? ?[facility] ?[address] ?[place], [place] [address] ?[phone] ?Please take advantage of the free valet  parking available at the MAIN entrance (A entrance).  ?Proceed to the [facility] Radiology Department (First Floor) for check-in.  ? ? ?Magnetic resonance imaging (MRI) is a painless test that produces images of the inside of the body without using Xrays. ? ?During an MRI, strong magnets and radio waves work together in a Research officer, political party to form detailed images.  ? ?MRI images may provide more details about a medical condition than X-rays, CT scans, and ultrasounds can provide. ? ?You may be given earphones to listen for instructions. ? ?You may eat a light breakfast and take medications as ordered with the exception of HCTZ (fluid pill, other). Please avoid stimulants for 12 hr prior to test. (Ie. Caffeine, nicotine, chocolate, or antihistamine medications) ? ?If a contrast material will be used, an IV will be inserted into one of your veins. Contrast material will be injected into your IV. It will leave your body through your urine within a day. You may be told to drink plenty of fluids to help flush the contrast material out of your system. ? ?You will be asked to remove all metal, including: Watch, jewelry, and other metal objects including hearing aids, hair pieces and dentures. Also wearable glucose monitoring systems (ie. Freestyle Libre and Omnipods) (Braces and fillings normally are not a problem.) ? ? ?TEST WILL TAKE APPROXIMATELY 1 HOUR ? ?PLEASE NOTIFY SCHEDULING AT LEAST 24 HOURS IN ADVANCE IF YOU ARE UNABLE TO KEEP YOUR APPOINTMENT. ?[phone] ? ?Please call Marchia Bond, cardiac imaging nurse navigator with any questions/concerns. ?Marchia Bond RN Navigator Cardiac Imaging ?[place]le  [place] RN Navigator Cardiac Imaging ?[facility] ?[phone] Office   ? ?Important Information About Sugar ? ? ? ? ?  ?

## 2021-08-05 LAB — CBC
Hematocrit: 44.5 % (ref 37.5–51.0)
Hemoglobin: 15.8 g/dL (ref 13.0–17.7)
MCH: 29.9 pg (ref 26.6–33.0)
MCHC: 35.5 g/dL (ref 31.5–35.7)
MCV: 84 fL (ref 79–97)
Platelets: 222 10*3/uL (ref 150–450)
RBC: 5.28 x10E6/uL (ref 4.14–5.80)
RDW: 14.1 % (ref 11.6–15.4)
WBC: 8.1 10*3/uL (ref 3.4–10.8)

## 2021-08-05 LAB — ALDOSTERONE + RENIN ACTIVITY W/ RATIO
ALDOS/RENIN RATIO: 23.1 (ref 0.0–30.0)
ALDOSTERONE: 6.7 ng/dL (ref 0.0–30.0)
Renin: 0.29 ng/mL/hr (ref 0.167–5.380)

## 2021-08-09 ENCOUNTER — Other Ambulatory Visit (HOSPITAL_COMMUNITY): Payer: Self-pay

## 2021-08-09 ENCOUNTER — Telehealth: Payer: Self-pay

## 2021-08-09 MED ORDER — SPIRONOLACTONE 50 MG PO TABS
50.0000 mg | ORAL_TABLET | Freq: Every day | ORAL | 3 refills | Status: DC
  Filled 2021-08-09: qty 90, 90d supply, fill #0
  Filled 2021-11-07: qty 90, 90d supply, fill #1

## 2021-08-09 NOTE — Telephone Encounter (Signed)
Called pt reviewed MD recommendations.  Pt reports BP remain elevated although does not check like instructed.  5/17 175/78; 5/18 169/72.  Advised pt to increase Aldactone to 50 mg PO QD and have repeat BMP in 2 weeks.  Pt will have labs drawn at place of employment.  Does not need lab ordered.

## 2021-08-09 NOTE — Telephone Encounter (Signed)
-----   Message from Werner Lean, MD sent at 08/09/2021  8:00 AM EDT ----- Regarding: F/u Labs are stable on Aldactone 25 mg PO .  Can we check on his BP?  If above 140/90, would increase dose to 50 mg and check BMP in two weeks (can be at his office).  Thanks, MAC

## 2021-08-12 ENCOUNTER — Other Ambulatory Visit (HOSPITAL_COMMUNITY): Payer: Self-pay

## 2021-08-13 ENCOUNTER — Other Ambulatory Visit (HOSPITAL_COMMUNITY): Payer: Self-pay

## 2021-08-13 MED ORDER — ROSUVASTATIN CALCIUM 20 MG PO TABS
20.0000 mg | ORAL_TABLET | Freq: Every day | ORAL | 3 refills | Status: DC
  Filled 2021-08-13: qty 90, 90d supply, fill #0
  Filled 2021-11-12: qty 90, 90d supply, fill #1
  Filled 2022-01-21: qty 90, 90d supply, fill #2
  Filled 2022-04-21: qty 90, 90d supply, fill #3

## 2021-08-13 MED ORDER — IRBESARTAN-HYDROCHLOROTHIAZIDE 300-12.5 MG PO TABS
ORAL_TABLET | ORAL | 3 refills | Status: DC
  Filled 2021-08-13: qty 90, 90d supply, fill #0
  Filled 2021-11-07: qty 90, 90d supply, fill #1
  Filled 2022-01-21: qty 90, 90d supply, fill #2
  Filled 2022-05-06: qty 90, 90d supply, fill #3

## 2021-08-14 ENCOUNTER — Other Ambulatory Visit (HOSPITAL_COMMUNITY): Payer: Self-pay

## 2021-09-03 ENCOUNTER — Encounter (HOSPITAL_COMMUNITY): Payer: Self-pay

## 2021-09-03 ENCOUNTER — Telehealth (HOSPITAL_COMMUNITY): Payer: Self-pay | Admitting: *Deleted

## 2021-09-03 NOTE — Telephone Encounter (Signed)
Reaching out to patient to offer assistance regarding upcoming cardiac imaging study; pt verbalizes understanding of appt date/time, parking situation and where to check in, ; name and call back number provided for further questions should they arise  Gordy Clement RN [place] [facility] [phone] office [phone] cell  Denies metal or claustrophobia.

## 2021-09-04 ENCOUNTER — Other Ambulatory Visit (HOSPITAL_COMMUNITY): Payer: Self-pay | Admitting: Internal Medicine

## 2021-09-04 ENCOUNTER — Ambulatory Visit (HOSPITAL_COMMUNITY)
Admission: RE | Admit: 2021-09-04 | Discharge: 2021-09-04 | Disposition: A | Discharge status: Home / Self Care | Payer: PPO | Source: Ambulatory Visit | Attending: Internal Medicine | Admitting: Internal Medicine

## 2021-09-04 DIAGNOSIS — I421 Obstructive hypertrophic cardiomyopathy: Secondary | ICD-10-CM

## 2021-09-04 DIAGNOSIS — I119 Hypertensive heart disease without heart failure: Secondary | ICD-10-CM

## 2021-09-04 MED ORDER — GADOBUTROL 1 MMOL/ML IV SOLN
10.0000 mL | Freq: Once | INTRAVENOUS | Status: AC | PRN
  Administered 2021-09-04: 10 mL via INTRAVENOUS

## 2021-09-19 DIAGNOSIS — H40033 Anatomical narrow angle, bilateral: Secondary | ICD-10-CM | POA: Diagnosis not present

## 2021-10-23 ENCOUNTER — Other Ambulatory Visit (HOSPITAL_COMMUNITY): Payer: Self-pay

## 2021-11-06 ENCOUNTER — Other Ambulatory Visit (HOSPITAL_COMMUNITY): Payer: Self-pay

## 2021-11-07 ENCOUNTER — Other Ambulatory Visit (HOSPITAL_COMMUNITY): Payer: Self-pay

## 2021-11-07 ENCOUNTER — Encounter (HOSPITAL_BASED_OUTPATIENT_CLINIC_OR_DEPARTMENT_OTHER): Payer: Self-pay | Admitting: Cardiovascular Disease

## 2021-11-07 ENCOUNTER — Ambulatory Visit (HOSPITAL_BASED_OUTPATIENT_CLINIC_OR_DEPARTMENT_OTHER): Payer: PPO | Admitting: Cardiovascular Disease

## 2021-11-07 DIAGNOSIS — E782 Mixed hyperlipidemia: Secondary | ICD-10-CM

## 2021-11-07 DIAGNOSIS — I421 Obstructive hypertrophic cardiomyopathy: Secondary | ICD-10-CM | POA: Diagnosis not present

## 2021-11-07 DIAGNOSIS — Z9989 Dependence on other enabling machines and devices: Secondary | ICD-10-CM

## 2021-11-07 DIAGNOSIS — G4733 Obstructive sleep apnea (adult) (pediatric): Secondary | ICD-10-CM | POA: Diagnosis not present

## 2021-11-07 DIAGNOSIS — E210 Primary hyperparathyroidism: Secondary | ICD-10-CM | POA: Diagnosis not present

## 2021-11-07 DIAGNOSIS — I10 Essential (primary) hypertension: Secondary | ICD-10-CM | POA: Diagnosis not present

## 2021-11-07 HISTORY — DX: Obstructive hypertrophic cardiomyopathy: I42.1

## 2021-11-07 MED ORDER — HYDROCHLOROTHIAZIDE 12.5 MG PO TABS
12.5000 mg | ORAL_TABLET | Freq: Every day | ORAL | 3 refills | Status: DC
  Filled 2021-11-07: qty 90, 90d supply, fill #0
  Filled 2022-01-21: qty 90, 90d supply, fill #1
  Filled 2022-04-21: qty 90, 90d supply, fill #2

## 2021-11-07 NOTE — Assessment & Plan Note (Signed)
Patient had surgery but they were unable to remove his parathyroid gland.  Calcium levels have been normal recently.

## 2021-11-07 NOTE — Assessment & Plan Note (Signed)
Alan Spencer has known hypertrophic cardiomyopathy.  He has a very mild murmur on exam, consistent with his mild gradient on echo.  It did increase with Valsalva.  He will need to continue with metoprolol.  He does much better on this than on carvedilol.  We need to avoid overdiuresis.  For now, we are stopping his spironolactone in order to help him enroll in the [employer] trio trial as above.  We will increase his hydrochlorothiazide to 25 mg for now.  Ideally, would like to reduce this back to 12.5 mg after his procedure.  He is followed with Dr. Rubye Beach.

## 2021-11-07 NOTE — Assessment & Plan Note (Signed)
Continue CPAP.  

## 2021-11-07 NOTE — Patient Instructions (Addendum)
Medication Instructions:  STOP SPIRONOLACTONE   START HYDROCHLOROTHIAZIDE 12.5 MG DAILY    Labwork: CATACHOLAMINES/METANEPHRINES SOON, FIRST THING IN THE MORNING   SEND IN COPY OF YOUR THYROID LABS    Testing/Procedures: NONE   Follow-Up: 12/02/2021 AT 2:30 WITH DR [facility]   Special Instructions:  MONITOR YOUR BLOOD PRESSURE TWICE A DAY, LOG IN THE BOOK PROVIDED. BRING THE BOOK AND YOUR BLOOD PRESSURE MACHINE TO YOUR FOLLOW UP IN 1 MONTH   DASH Eating Plan DASH stands for "Dietary Approaches to Stop Hypertension." The DASH eating plan is a healthy eating plan that has been shown to reduce high blood pressure (hypertension). It may also reduce your risk for type 2 diabetes, heart disease, and stroke. The DASH eating plan may also help with weight loss. What are tips for following this plan?  General guidelines Avoid eating more than 2,300 mg (milligrams) of salt (sodium) a day. If you have hypertension, you may need to reduce your sodium intake to 1,500 mg a day. Limit alcohol intake to no more than 1 drink a day for nonpregnant women and 2 drinks a day for men. One drink equals 12 oz of beer, 5 oz of wine, or 1 oz of hard liquor. Work with your health care provider to maintain a healthy body weight or to lose weight. Ask what an ideal weight is for you. Get at least 30 minutes of exercise that causes your heart to beat faster (aerobic exercise) most days of the week. Activities may include walking, swimming, or biking. Work with your health care provider or diet and nutrition specialist (dietitian) to adjust your eating plan to your individual calorie needs. Reading food labels  Check food labels for the amount of sodium per serving. Choose foods with less than 5 percent of the Daily Value of sodium. Generally, foods with less than 300 mg of sodium per serving fit into this eating plan. To find whole grains, look for the word "whole" as the first word in the ingredient  list. Shopping Buy products labeled as "low-sodium" or "no salt added." Buy fresh foods. Avoid canned foods and premade or frozen meals. Cooking Avoid adding salt when cooking. Use salt-free seasonings or herbs instead of table salt or sea salt. Check with your health care provider or pharmacist before using salt substitutes. Do not fry foods. Cook foods using healthy methods such as baking, boiling, grilling, and broiling instead. Cook with heart-healthy oils, such as olive, canola, soybean, or sunflower oil. Meal planning Eat a balanced diet that includes: 5 or more servings of fruits and vegetables each day. At each meal, try to fill half of your plate with fruits and vegetables. Up to 6-8 servings of whole grains each day. Less than 6 oz of lean meat, poultry, or fish each day. A 3-oz serving of meat is about the same size as a deck of cards. One egg equals 1 oz. 2 servings of low-fat dairy each day. A serving of nuts, seeds, or beans 5 times each week. Heart-healthy fats. Healthy fats called Omega-3 fatty acids are found in foods such as flaxseeds and coldwater fish, like sardines, salmon, and mackerel. Limit how much you eat of the following: Canned or prepackaged foods. Food that is high in trans fat, such as fried foods. Food that is high in saturated fat, such as fatty meat. Sweets, desserts, sugary drinks, and other foods with added sugar. Full-fat dairy products. Do not salt foods before eating. Try to eat at least 2 vegetarian  meals each week. Eat more home-cooked food and less restaurant, buffet, and fast food. When eating at a restaurant, ask that your food be prepared with less salt or no salt, if possible. What foods are recommended? The items listed may not be a complete list. Talk with your dietitian about what dietary choices are best for you. Grains Whole-grain or whole-wheat bread. Whole-grain or whole-wheat pasta. Brown rice. [place]. Bulgur. Whole-grain and  low-sodium cereals. Pita bread. Low-fat, low-sodium crackers. Whole-wheat flour tortillas. Vegetables Fresh or frozen vegetables (raw, steamed, roasted, or grilled). Low-sodium or reduced-sodium tomato and vegetable juice. Low-sodium or reduced-sodium tomato sauce and tomato paste. Low-sodium or reduced-sodium canned vegetables. Fruits All fresh, dried, or frozen fruit. Canned fruit in natural juice (without added sugar). Meat and other protein foods Skinless chicken or [place]. Ground chicken or [place]. Pork with fat trimmed off. Fish and seafood. Egg whites. Dried beans, peas, or lentils. Unsalted nuts, nut butters, and seeds. Unsalted canned beans. Lean cuts of beef with fat trimmed off. Low-sodium, lean deli meat. Dairy Low-fat (1%) or fat-free (skim) milk. Fat-free, low-fat, or reduced-fat cheeses. Nonfat, low-sodium ricotta or cottage cheese. Low-fat or nonfat yogurt. Low-fat, low-sodium cheese. Fats and oils Soft margarine without trans fats. Vegetable oil. Low-fat, reduced-fat, or light mayonnaise and salad dressings (reduced-sodium). Canola, safflower, olive, soybean, and sunflower oils. Avocado. Seasoning and other foods Herbs. Spices. Seasoning mixes without salt. Unsalted popcorn and pretzels. Fat-free sweets. What foods are not recommended? The items listed may not be a complete list. Talk with your dietitian about what dietary choices are best for you. Grains Baked goods made with fat, such as croissants, muffins, or some breads. Dry pasta or rice meal packs. Vegetables Creamed or fried vegetables. Vegetables in a cheese sauce. Regular canned vegetables (not low-sodium or reduced-sodium). Regular canned tomato sauce and paste (not low-sodium or reduced-sodium). Regular tomato and vegetable juice (not low-sodium or reduced-sodium). Angie Fava. Olives. Fruits Canned fruit in a light or heavy syrup. Fried fruit. Fruit in cream or butter sauce. Meat and other protein foods Fatty cuts of  meat. Ribs. Fried meat. Berniece Salines. Sausage. Bologna and other processed lunch meats. Salami. Fatback. Hotdogs. Bratwurst. Salted nuts and seeds. Canned beans with added salt. Canned or smoked fish. Whole eggs or egg yolks. Chicken or [place] with skin. Dairy Whole or 2% milk, cream, and half-and-half. Whole or full-fat cream cheese. Whole-fat or sweetened yogurt. Full-fat cheese. Nondairy creamers. Whipped toppings. Processed cheese and cheese spreads. Fats and oils Butter. Stick margarine. Lard. Shortening. Ghee. Bacon fat. Tropical oils, such as coconut, palm kernel, or palm oil. Seasoning and other foods Salted popcorn and pretzels. Onion salt, garlic salt, seasoned salt, table salt, and sea salt. Worcestershire sauce. Tartar sauce. Barbecue sauce. Teriyaki sauce. Soy sauce, including reduced-sodium. Steak sauce. Canned and packaged gravies. Fish sauce. Oyster sauce. Cocktail sauce. Horseradish that you find on the shelf. Ketchup. Mustard. Meat flavorings and tenderizers. Bouillon cubes. Hot sauce and Tabasco sauce. Premade or packaged marinades. Premade or packaged taco seasonings. Relishes. Regular salad dressings. Where to find more information: National Heart, Lung, and [place]: https://wilson-eaton.com/ American Heart Association: www.heart.org Summary The DASH eating plan is a healthy eating plan that has been shown to reduce high blood pressure (hypertension). It may also reduce your risk for type 2 diabetes, heart disease, and stroke. With the DASH eating plan, you should limit salt (sodium) intake to 2,300 mg a day. If you have hypertension, you may need to reduce your sodium intake to 1,500  mg a day. When on the DASH eating plan, aim to eat more fresh fruits and vegetables, whole grains, lean proteins, low-fat dairy, and heart-healthy fats. Work with your health care provider or diet and nutrition specialist (dietitian) to adjust your eating plan to your individual calorie needs. This  information is not intended to replace advice given to you by your health care provider. Make sure you discuss any questions you have with your health care provider. Document Released: 02/20/2011 Document Revised: 02/13/2017 Document Reviewed: 02/25/2016 Elsevier Patient Education  2020 [employer].

## 2021-11-07 NOTE — Progress Notes (Signed)
[facility] Initial Assessment:    Date:  11/07/2021   ID:  Alan Spencer, DOB 01-08-1956, MRN [id number]  PCP:  Donnajean Lopes, MD  Cardiologist:  None  Nephrologist:  Referring MD: Lorretta Harp, MD   CC: Hypertension  History of Present Illness:    Alan Spencer is a 66 y.o. male with a hx of hypertrophic cardiomyopathy, hypertension, hyperparathyroidism, hyperlipidemia, and OSA on CPAP, here to establish care in the [facility]. He has struggled with resistant hypertension in the 160s-170s. He had renal artery dopplers 06/2021 that had normal blood flow. Dr. Gwenlyn Found referred him to the [facility]. He also had an echo 04/2021 suggestive of HCM. LVEF was 70-75% with moderate hypertrophy of the basal septum (1.45cm). He had a mild gradient with peak velocity of 1.6 m/s that increased to 3.2 m/s with valsalva. He was referred to Dr. Jackqulyn Livings and had a cardiac MRI 08/2021 that was felt to be consistent with HCM. There was no LGE. He previously had a coronary CT 2/23 with minimal plaque in the RCA. His calcium score was 40 which was 41st percentile.   Today he is still having trouble with keeping his blood pressure consistently low. He has been hypertensive since he was about 20-13 years of age. At first it was more easily controlled but now it is more difficult. He tries to monitor his blood pressure at home every 2-3 days. He usually performs 3 checks at one time, with resting between checks. For exercise he plays tennis 3 times a week and walks on the treadmill 2-3 times per week as well. Seems to feel better while exercising. Since he has been off of the carvedilol, he has more energy. However, he still feels he does not have the energy he should have despite being off the medication. He is currently taking spironolactone, metoprolol amlodipine, and irbesartan-HCTZ. During exertion he has experienced some sweating and flushing.  Previously he had issues with pitting edema in his lower extremities. It has improved in the past few months since his medication was last adjusted. His diet consists of an egg or bagel in the morning; lunch is usually brought in from home, and for dinner he usually eats at home but does eat pre made food from [employer]. He does not add any salt to his meals, but he does not monitor his sodium intake closely. Normally he does not have much caffeine in his diet, and has very little alcohol intake. He will sometimes have a beer after tennis but not every time. Does have a CPAP that he uses regularly to help with sleep apnea. He denies any palpitations, chest pain, shortness of breath. No lightheadedness, headaches, syncope, orthopnea, or PND.   Previous antihypertensives:  Secondary Causes of Hypertension  Medications/Herbal: OCP, steroids, stimulants, antidepressants, weight loss medication, immune suppressants, NSAIDs, sympathomimetics, alcohol, caffeine, licorice, ginseng, St. John's wort, chemo  Sleep Apnea Renal artery stenosis Hyperaldosteronism Hyper/hypothyroidism Pheochromocytoma: palpitations, tachycardia, headache, diaphoresis (plasma metanephrines) Cushing's syndrome: Cushingoid facies, central obesity, proximal muscle weakness, and ecchymoses, adrenal incidentaloma (cortisol) Coarctation of the aorta  Past Medical History:  Diagnosis Date   Anxiety    Essential hypertension, benign    Excessive daytime sleepiness 07/03/2015   Family history of colon cancer    HNP (herniated nucleus pulposus), lumbar    L4-L5   Mixed hyperlipidemia    Obesity (BMI 30-39.9) 01/10/2018   Obstructive hypertrophic cardiomyopathy ([place]) 11/07/2021   Rectal bleeding  Resistant hypertension    Essential hypertension   Sleep apnea    uses CPAP    Past Surgical History:  Procedure Laterality Date   broken left elbow     NASAL SEPTUM SURGERY  2022   PARATHYROIDECTOMY N/A 12/27/2020   Procedure:  NECK EXPLORATION WITH BIOPSY OF PARATHYROID GLAND x3;  Surgeon: Armandina Gemma, MD;  Location: [facility];  Service: General;  Laterality: N/A;   TONSILLECTOMY      Current Medications: Current Meds  Medication Sig   amLODipine (NORVASC) 10 MG tablet Take 1 tablet by mouth once daily   Armodafinil 250 MG tablet Take 250 mg by mouth as needed.   Coenzyme Q10 (COQ10) 100 MG CAPS Take 100 mg by mouth daily.   fluticasone (FLONASE) 50 MCG/ACT nasal spray Place 1 spray into both nostrils daily.   Ginkgo Biloba 500 MG CAPS Take 500 mg by mouth as needed.   glucosamine-chondroitin 500-400 MG tablet Take 1 tablet by mouth daily.   hydrochlorothiazide (HYDRODIURIL) 12.5 MG tablet Take 1 tablet (12.5 mg total) by mouth daily.   irbesartan-hydrochlorothiazide (AVALIDE) 300-12.5 MG tablet Take 1 tablet by mouth once a day   metoprolol (TOPROL XL) 200 MG 24 hr tablet Take 1 tablet by mouth daily.   Multiple Vitamins-Minerals (MULTIVITAMIN WITH MINERALS) tablet Take 1 tablet by mouth daily.   omega-3 acid ethyl esters (LOVAZA) 1 g capsule Take 2 g by mouth 2 (two) times daily.   rosuvastatin (CRESTOR) 20 MG tablet TAKE ONE TABLET BY MOUTH ONCE DAILY.   triamcinolone cream (KENALOG) 0.1 % as needed for rash.   [DISCONTINUED] spironolactone (ALDACTONE) 50 MG tablet Take 1 tablet (50 mg total) by mouth daily.     Allergies:   Patient has no known allergies.   Social History   Socioeconomic History   Marital status: Married    Spouse name: Not on file   Number of children: Not on file   Years of education: Not on file   Highest education level: Not on file  Occupational History   Not on file  Tobacco Use   Smoking status: Never   Smokeless tobacco: Never  Vaping Use   Vaping Use: Never used  Substance and Sexual Activity   Alcohol use: Yes    Comment: occas.   Drug use: No   Sexual activity: Not on file  Other Topics Concern   Not on file  Social History Narrative   Not on file   Social  Determinants of Health   Financial Resource Strain: Low Risk  (11/07/2021)   Overall Financial Resource Strain (CARDIA)    Difficulty of Paying Living Expenses: Not hard at all  Food Insecurity: No Food Insecurity (11/07/2021)   Hunger Vital Sign    Worried About Running Out of Food in the Last Year: Never true    [place] in the Last Year: Never true  Transportation Needs: Unknown (11/07/2021)   PRAPARE - Hydrologist (Medical): No    Lack of Transportation (Non-Medical): Not on file  Physical Activity: Sufficiently Active (11/07/2021)   Exercise Vital Sign    Days of Exercise per Week: 7 days    Minutes of Exercise per Session: 70 min  Recent Concern: Physical Activity - Insufficiently Active (11/07/2021)   Exercise Vital Sign    Days of Exercise per Week: 4 days    Minutes of Exercise per Session: 30 min  Stress: Not on file  Social Connections: Not  on file     Family History: The patient's family history includes Colon cancer in his paternal grandmother; Hypertension in his mother; Hypertrophic cardiomyopathy in his mother; Kidney failure in his father.  ROS:   Please see the history of present illness.    All other systems reviewed and are negative.  EKGs/Labs/Other Studies Reviewed:    Cardiac MRI September 28, 2021: FINDINGS: 1. Normal left ventricular size, with LVEDD 46 mm, and LVEDVi 46 mL/m2.   Normal left ventricular thickness, with intraventricular septal thickness of 15 mm, posterior wall thickness of 10 mm, though myocardial mass index normal for gender at 65 g/m2.   Normal left ventricular systolic function (LVEF =78%). There are no regional wall motion abnormalities. There is late peaking chordal systolic motion of the anterior mitral valve (chordal SAM).   Left ventricular parametric mapping notable for normal ECV signal and normal T2 signal.   There is no late gadolinium enhancement in the left ventricular myocardium. Six  standard deviation reference used.   2. Normal right ventricular size with RVEDVI 87 mL/m2.   Normal right ventricular thickness.   Normal right ventricular systolic function (RVEF =24%). There are no regional wall motion abnormalities or aneurysms.   3.  Normal left and right atrial size.   4. Normal size of the aortic root, ascending aorta and pulmonary artery.   5. Valve assessment:   Aortic Valve: Tri-leaflet aortic valve. Regurgitant fraction 14%, mean gradient of 1 mm Hg. Mild central aortic regurgitation.   Pulmonic Valve: Regurgitant fraction 2%, mean gradient < 1 mm Hg. Qualitatively, there is no significant regurgitation.   Tricuspid Valve: Regurgitant fraction 10%. Mild central tricuspid regurgitation   Mitral Valve: Regurgitant fraction 16%.  Mild mitral regurgitation.   6.  Normal pericardium.  No pericardial effusion.   7. Grossly, no extracardiac findings. Recommended dedicated study if concerned for non-cardiac pathology.   IMPRESSION: In absence of system disease leading to hypertrophy, no evidence of infiltrative disease and consistent with hypertrophic cardiomyopathy.   There is qualitatively turbulent LVOT flow often seen in hypertrophic cardiomyopathy.   No evidence of scar.  Renal Artery Doppler 07/02/2021: Summary:  Largest Aortic Diameter: 1.9 cm     Renal:     Right: Normal size right kidney. Normal cortical thickness of right         kidney. No evidence of right renal artery stenosis. Cyst(s)         noted. RRV flow present. Abnormal right Resistive Index.         Avascular cystic structure in lower polen of the kidney,         measuring 2.7 x 2.0 x 3.3 cm.  Left:  Normal size of left kidney. Normal cortical thickness of the         left kidney. No evidence of left renal artery stenosis. LRV         flow present. Abnormal left Resisitve Index.  Mesenteric:  Normal Celiac artery and Superior Mesenteric artery findings.     Patent  IVC.  Cardiac CTA 04/26/2021: FINDINGS: Non-cardiac: See separate report from [facility]. No significant findings on limited lung and soft tissue windows.   Calcium score: Mild 2 vessel calcium noted   LM 0   LAD 0.305   LCX 0   RCA 39.5   Total: 39.8 which is 41 st percentile for age/sex   Coronary Arteries: Right dominant with no anomalies   LM: Normal   LAD: 1-24% calcified plaque in mid vessel  D1: Normal   D2: Normal   D3 Normal   Circumflex: Normal   OM1: Normal   OM2: Normal   OM3: Normal   RCA: 1-24% calcified plaque in proximal, mid and distal vessel   PDA: Normal   PLA: Normal   IMPRESSION: 1. Calcium score 39.8 which is 41 st percentile for age/sex   2.  Normal ascending thoracic aorta 3.6 cm   3.  CAD RADS 1 non obstructive CAD see description above  Echocardiogram 04/19/2021: IMPRESSIONS     1. Moderate hypertophy of the basal septum with otherwise mild concentric  LVH. Mild intracavitary gradient. Peak velocity 1.6 m/s. Peak gradient 10  mmHg. Velocity increases to 3.2 m/s and peak gradient increases to 41.2  mmHg. Left ventricular ejection  fraction, by estimation, is 70 to 75%. The left ventricle has hyperdynamic  function. The left ventricle has no regional wall motion abnormalities.  There is moderate left ventricular hypertrophy of the basal-septal  segment. Left ventricular diastolic  parameters are consistent with Grade I diastolic dysfunction (impaired  relaxation). The average left ventricular global longitudinal strain is  -20.4 %. The global longitudinal strain is normal.   2. Right ventricular systolic function is normal. The right ventricular  size is mildly enlarged. There is normal pulmonary artery systolic  pressure.   3. Left atrial size was severely dilated.   4. Right atrial size was severely dilated.   5. The mitral valve is normal in structure. Trivial mitral valve  regurgitation. No evidence of mitral  stenosis.   6. The aortic valve is tricuspid. There is mild calcification of the  aortic valve. There is mild thickening of the aortic valve. Aortic valve  regurgitation is not visualized. No aortic stenosis is present.   7. Aortic dilatation noted. There is mild dilatation of the ascending  aorta, measuring 37 mm.   8. The inferior vena cava is dilated in size with >50% respiratory  variability, suggesting right atrial pressure of 8 mmHg.     EKG:  EKG is personally reviewed. 11/07/2021:  EKG was not ordered.  Recent Labs: 12/28/2020: BUN 21; Creatinine, Ser 1.06; Potassium 3.5; Sodium 139 07/30/2021: Hemoglobin 15.8; Platelets 222   Recent Lipid Panel No results found for: "CHOL", "TRIG", "HDL", "CHOLHDL", "VLDL", "LDLCALC", "LDLDIRECT"  Physical Exam:    VS:  BP (!) 172/78 (BP Location: Right Arm, Patient Position: Sitting, Cuff Size: Normal)   Pulse (!) 53   Ht '5\' 8"'$  (1.727 m)   Wt 207 lb 1.6 oz (93.9 kg)   SpO2 97%   BMI 31.49 kg/m  , BMI Body mass index is 31.49 kg/m. GENERAL:  Well appearing HEENT: Pupils equal round and reactive, fundi not visualized, oral mucosa unremarkable NECK:  No jugular venous distention, waveform within normal limits, carotid upstroke brisk and symmetric, no bruits, no thyromegaly LUNGS:  Clear to auscultation bilaterally HEART:  RRR.  PMI not displaced or sustained,S1 and S2 within normal limits, no S3, no S4, no clicks, no rubs, II/VI systolic murmur at the LUSB ABD:  Flat, positive bowel sounds normal in frequency in pitch, no bruits, no rebound, no guarding, no midline pulsatile mass, no hepatomegaly, no splenomegaly EXT:  2 plus pulses throughout, no edema, no cyanosis no clubbing SKIN:  No rashes no nodules NEURO:  Cranial nerves II through XII grossly intact, motor grossly intact throughout PSYCH:  Cognitively intact, oriented to person place and time  ASSESSMENT/PLAN:    Resistant hypertension He has struggled with resistant  hypertension.  He has had a thorough evaluation for secondary causes.  We will check serum catecholamines and metanephrines given that he had some issues with flushing.  He had thyroid function tested in his PCP and will have this sent to [place].  It has reportedly been normal.  He is compliant with his CPAP.  He is going to work on limiting his sodium intake.  I think that he would be a very good candidate for renal denervation.  We will work on getting him enrolled in the [facility] trio trial.  For this, we will have to stop his spironolactone.  In the interim we will increase his HCTZ to 25 mg.  Continue amlodipine, irbesartan, HCTZ, and metoprolol.  He is on metoprolol for hypertrophic cardiomyopathy.  We will have him evaluated to enter the study in 1 month, after which time he will have been on this regimen for 30 days and can have his ambulatory blood pressure monitor.  OSA on CPAP Continue CPAP.  Hyperparathyroidism, primary [facility]) Patient had surgery but they were unable to remove his parathyroid gland.  Calcium levels have been normal recently.  Mixed hyperlipidemia Lipids are very well controlled on rosuvastatin.  He was encouraged to keep up his regular exercise.  Obstructive hypertrophic cardiomyopathy [facility]) Mr. Cocuzza has known hypertrophic cardiomyopathy.  He has a very mild murmur on exam, consistent with his mild gradient on echo.  It did increase with Valsalva.  He will need to continue with metoprolol.  He does much better on this than on carvedilol.  We need to avoid overdiuresis.  For now, we are stopping his spironolactone in order to help him enroll in the [facility] trio trial as above.  We will increase his hydrochlorothiazide to 25 mg for now.  Ideally, would like to reduce this back to 12.5 mg after his procedure.  He is followed with Dr. Rubye Beach.   Screening for Secondary Hypertension:     11/07/2021   11:53 AM  Causes  Drugs/Herbals Screened     - Comments not salt  conscious, minimal EtOH, minimal caffeine  Renovascular HTN Screened     - Comments renal Dopplers negative  Sleep Apnea Screened     - Comments uses CPAP  Thyroid Disease Screened     - Comments will send TSH from PCP    Relevant Labs/Studies:    Latest Ref Rng & Units 12/28/2020    3:55 AM 12/21/2020    1:55 PM 09/15/2015    9:34 PM  Basic Labs  Sodium 135 - 145 mmol/L 139  142  140   Potassium 3.5 - 5.1 mmol/L 3.5  3.5  3.1   Creatinine 0.61 - 1.24 mg/dL 1.06  0.92  0.89           Latest Ref Rng & Units 07/30/2021   12:03 PM  Renin/Aldosterone   Aldosterone 0.0 - 30.0 ng/dL 6.7   Renin 0.167 - 5.380 ng/mL/hr 0.290   Aldos/Renin Ratio 0.0 - 30.0 23.1              07/02/2021   12:40 PM  Renovascular   Renal Artery [place] Completed Yes     Disposition:    FU with Tonni Mansour C. Oval Linsey, MD, [facility] in 1 month   Medication Adjustments/Labs and Tests Ordered: Current medicines are reviewed at length with the patient today.  Concerns regarding medicines are outlined above.  Orders Placed This Encounter  Procedures   Catecholamines, fractionated, plasma   Metanephrines, plasma  Meds ordered this encounter  Medications   hydrochlorothiazide (HYDRODIURIL) 12.5 MG tablet    Sig: Take 1 tablet (12.5 mg total) by mouth daily.    Dispense:  90 tablet    Refill:  3    MAIL TO PATIENT PLEASE D/C SPIRONOLACTONE   I,Jessica Ford,acting as a scribe for Skeet Latch, MD.,have documented all relevant documentation on the behalf of Skeet Latch, MD,as directed by  Skeet Latch, MD while in the presence of Skeet Latch, MD.  I, Griffin Oval Linsey, MD have reviewed all documentation for this visit.  The documentation of the exam, diagnosis, procedures, and orders on 11/07/2021 are all accurate and complete.   Signed, Skeet Latch, MD  11/07/2021 12:56 PM    [place]

## 2021-11-07 NOTE — Assessment & Plan Note (Signed)
Lipids are very well controlled on rosuvastatin.  He was encouraged to keep up his regular exercise.

## 2021-11-07 NOTE — Assessment & Plan Note (Signed)
He has struggled with resistant hypertension.  He has had a thorough evaluation for secondary causes.  We will check serum catecholamines and metanephrines given that he had some issues with flushing.  He had thyroid function tested in his PCP and will have this sent to [place].  It has reportedly been normal.  He is compliant with his CPAP.  He is going to work on limiting his sodium intake.  I think that he would be a very good candidate for renal denervation.  We will work on getting him enrolled in the [facility] trio trial.  For this, we will have to stop his spironolactone.  In the interim we will increase his HCTZ to 25 mg.  Continue amlodipine, irbesartan, HCTZ, and metoprolol.  He is on metoprolol for hypertrophic cardiomyopathy.  We will have him evaluated to enter the study in 1 month, after which time he will have been on this regimen for 30 days and can have his ambulatory blood pressure monitor.

## 2021-11-08 ENCOUNTER — Other Ambulatory Visit (HOSPITAL_COMMUNITY): Payer: Self-pay

## 2021-11-08 DIAGNOSIS — I10 Essential (primary) hypertension: Secondary | ICD-10-CM | POA: Diagnosis not present

## 2021-11-11 ENCOUNTER — Other Ambulatory Visit (HOSPITAL_COMMUNITY): Payer: Self-pay

## 2021-11-12 ENCOUNTER — Other Ambulatory Visit (HOSPITAL_COMMUNITY): Payer: Self-pay

## 2021-11-12 VITALS — BP 190/98 | HR 55 | Resp 16 | Wt 207.0 lb

## 2021-11-12 DIAGNOSIS — Z006 Encounter for examination for normal comparison and control in clinical research program: Secondary | ICD-10-CM

## 2021-11-12 NOTE — Research (Signed)
[facility] TRIAL - [place]  Patient ID:  [id number]    -  [id number]             SCREENING - Visit O    Visit Date: 12-Nov-2021  Screening Visit Information        Informed Consent Date;  12-Nov-2021   Protocol Version :    '[]'  A    '[x]'   B  '[]'   C  ICF Version Date:  13-Mar-2021 ICF Version:     '[]'  A   '[x]'  B   '[]'   C  Was this subject previously consented?  '[]'   Yes   '[x]'   No  Initial Subject ID  ____  Medical History  Section 1 Respiratory Health History:  Does the subject have a history of chronic obstructive pulmonary      Disease (COPD)?    '[]'  Yes  '[x]'   No  Does the subject have a history of Sleep Apnea? '[x]'   Yes  '[]'  No     Sleep Apnea Type:  '[x]'  Obstructive    '[]'  Central   '[]'   Mixed  '[]'  Unknown     Does the subject use CPAP regularly?  '[x]'   Yes  '[]'   No  Section 2:  Endocrine Health History  Does the subject have a history of Diabetes?  '[]'   Yes  '[x]'  No    Diabetes Type:   '[]'  Type I  '[]'   Type II  '[]'   Unknown  Is medication taken for diabetes?  '[]'  Yes  '[x]'  No    (If Yes, please update medication log)   Section 3: Renal Health History  Does the subject have a history of chronic kidney disease? '[]'  Yes '[x]'  No    Most recent eGFR Value: _____    mL/min/1.73M2    Sample Collection Date; _____  dd/mmm/yyyy  Does the subject have a history of polycystic kidney disease?  '[]'  Yes '[x]'  No  Section 4: Vascular Health History  Does the subject have a history of Cerebrovascular events?  '[]'  Yes  '[x]'  No     If yes, what type of event?  '[]'  TIA   '[]'  Stroke  '[]'  Other, specify? ________  What is the estimated date of the most recent event? ______  dd/mmm/yyyy  Section 5:  Cancer History  Does the subject have a history of cancer?  '[]'  Yes  '[x]'  No    If yes, what type of cancer?  _______    If yes, estimated date of diagnosis?________ dd/mmm/yyyy  Is the subject currently undergoing treatment for cancer?  '[]'   Yes  '[]'   No    Section 6: Other  Does the subject have any active  implantable medical devices? '[x]'   None '[]'  Neuro Stimulator '[]'  Spinal Stimulator  '[]'   Baroflex Stimulator  '[]'  Other  Cardiovascular History   Has the subject had any past or present cardiovascular conditions?  '[]'  Yes '[x]'  No    (Except Hypertensive History)  Does the subject have a history of myocardial infarction (MI)?  '[]'  Yes  '[x]'  No    If Yes, estimated date of most recent myocardial infarction (MI): _______ dd/mmm/yyyy  Does the subject have a history of atrial arrhythmias?  '[]'  Yes  '[x]'  No      Check all that apply:   '[]'  Atrial Tachycardia   '[]'  Atrial Flutter   '[]'   Atrial Fibrillation                           '[]'   Other, Specify ____________________       If atrial fibrillation was selected, how is the AF classified?  '[x]'   N/A          '[]'  Paroxysmal    '[]'  Persistent   '[]'  Permanent     Date of most recent documented episode of atrial arrhythmia?  _______ dd/mmm/yyyy  Does the subject have a history of atrial ablation to treat arrhythmia? '[]'  Yes '[x]'  No      If yes, date of most recent atrial ablation: _______  dd/mmm/yyyy  Does the subject have a history of ventricular arrhythmias?  '[]'  Yes  '[x]'  No      If yes, date of most recent ventricular arrhythmias: ________   dd/mmm/yyyy  Does the subject have a history of ventricular ablations to treat an arrhythmia?  '[]'  Yes '[x]'  No     If Yes, date of most recent ventricular ablation: ________ dd/mmm/yyyy  Does the subject have a history of Heart Failure?  '[]'  Yes  '[x]'  No  Has the subject been hospitalized for Heart Failure?  '[]'  Yes '[x]'  No     If yes, estimated date of most recent hospitalization: ________ dd/mmm/yyyy  NYHA Class:  '[]'  Class I  '[x]'  Class II  '[]'  Class III  '[]'  ClassI V  '[]'  Unknown    NYHA Classification date: 16/May/2023  Does the subject have a history of cardiac arrest? '[]'  Yes '[x]'  No    If Yes, estimated date of the most recent episode of cardiac arrest: _____ dd/mmm/yyyy  Does the subject have an implanted cardioverter  defibrillator (ICD)? '[]'  Yes '[x]'  No   If yes, Estimated date of most recent implanted ICD:  _______  dd/mmm/yyyy  Does the subject have a history of bradycardia:    '[]'  Yes  '[x]'  No  Does the subject have an implanted pacemaker?  '[]'  Yes  '[x]'  No  Does the subject have a history of hyperlipidemia? '[x]'  Yes '[]'  No  Does the subject have a history of documented episodes of angina? '[]'  Yes '[x]'  No     If Yes, estimated date of the most recent episode of angina:  _______ dd/mmm/yyyy  Does the subject have a history of coronary artery disease (CAD)? '[]'  Yes '[x]'  No  Does the subject have a history of revascularization?  '[]'  Yes '[x]'  No    Type of revascularization?   '[]'  Stent  '[]'  CABG Other, Specify: _______    Estimated date of the most recent vascularization procedure: _______ dd/mmm/yyyy  Does the subject have a history of cardiac valve repair or replacement?   '[]'  Yes '[x]'  No   If yes, estimated date of valve repair or replacement:  _______ dd/mmm/yyyy     Hypertension History:   Does the Subject have a history of hypertension? '[x]'  Yes '[]'  No   What was the date of diagnosis of hypertension: _____  dd/mmm/yyyy   Hypertension Category:  '[]'  Controlled with medication      '[x]'  Uncontrolled with medication     '[]'  Uncontrolled with no medication  Does the subject have documented Masked Hypertension?  (Subject's office   BP readings are substantially lower than home BP readings under   Normal conditions.)    '[]'  Yes   '[x]'  No  Does the subject have white coat hypertension? (Subject's feeling of   Anxiety in medical office results in high BP reading)    '[]'  Yes '[x]'  No  Has the subject ever been hospitalized for hypertensive crisis? '[]'  Yes '[x]'   No    If yes, what was the date of the most recent hospitalization for          Hypertensive crisis?   ________ dd/mmm/yyyy  Does the subject have a history of a prior Renal Denervation procedure? '[]'  Yes '[x]'  No     If Yes,  Renal Denervation System used to  treat the Subject:____________                 Date of Renal Denervation treatment:________  dd/mmm/yyyy  Does the subject have a history of primary pulmonary hypertension? '[]'  Yes '[]'  No   Physical Examination:   Was the Physical examination performed?  '[x]'  Yes  '[]'   No    If no, reason not done: ____________  Time of collection:   12:00   Weight:  207__  '[]'  kg '[x]'  lb        Height: _68_ '[]'  cm '[x]'  Inches  Abdominal Circumference measurement:  __110_  cm  Right Brachial circumference measurement: _32_ cm  Left Brachial circumference measurement: _34_ cm  Heart Rate:  _55_  bpm   Demographics: All information will be found within the subject's Epic EMR   Office Blood Pressure Measurements:  Instructions  Please make sure that the patient has rested quietly for at least 5 minutes prior to taking any blood pressure measurements. Record BP in both arms. Use the arm that gives the higher systolic reading for subsequent readings.  Please take 3 seated and one standing measurements. The first measurement will not be used to calculate the average Office Blood Pressure but will be documente     '[x]'   Initial Right Arm BP Measurement: _194_/99_  mmHg '[x]'   Initial Left Arm BP Measurement: 196_/_104_ mmHg '[]'   Not Done '[]'  Right /  '[]'  Left  and reason: ____________________  *Arm chosen and used for blood pressure measurement for the duration of the study:       '[]'    Right Arm       '[x]'    Left Arm      Systolic BP          (mmHg) Diastolic BP    (mmHg) Pulse              (bpm)         1. First-seated  BP 194 97 56    2. Second-seated BP 187 94 56    3. Third-Seated BP 154 100 54    4. Fourth-               STANDING  BP 166 91 47   Per [facility] iMEDIDATA;  derived from above data Avg Seated Systolic BP:  063.0 Avg Seated Diastolic BP: 97 Avg. Seated Pulse Rate: 55    NCLUSION CRITERIA  Appropriately signed and dated informed consent:  '[x]'  Yes  '[]'  No  Age >/= 52 and </= 75 years at time  of consent:  '[x]'   Yes  '[]'  No  Documented history of hypertension:    '[x]'  Yes '[]'   No  Average seated office BP >/= 140/90 mmHg at screening visit (V0):                '[x]'  Yes    '[]'  No           '[]'   RADIANCE II: Despite taking stable doses of 0-2 antihypertension                Medications of different classes for at least 4  weeks prior to consent      '[x]'   RADIANCE TRIO: Despite taking stable doses of Three or more                Antihypertensive medication, including a diuretic, for at least                 4 weeks prior to consent.   5.  Documented daytime ABP >/= 135/85 mmHg following a 4-week run-in/         Stabilization period on standardized anti-hypertensive medication regimen              '[x]'  Yes   '[]'  No  6.  Suitable renal anatomy compatible with the renal denervation procedure and       Documented by renal CTA or MRA of good quality performed within one year       Prior to consent (a CTA or MRA will be obtained in subjects without a recent       (</= 1 year) renal imaging)     '[x]'   Yes  '[]'   No  7.  Able and willing to comply with all study procedures? '[x]'  Yes  '[]'  No  EXCLUSION CRITERIA:   1.  Renal artery anatomy on either side, ineligible for treatment including:      a) Main renal artery diameter < 26m and > 8 mm      b) Main renal treatable artery length < 20 mm (may include proximal branching)      c)  A single functioning kidney      d) Presence of renal malignancy or secreting adrenal tumor      e) Renal artery with aneurysm      f) pre-existing renal stent or history of renal artery angioplasty      g) Pre-existing aortic stent or history of aortic aneurysm.       h) Prior renal denervation procedure      i)  Fibromuscular disease of the renal arteries      j)  Presence of renal artery stenosis of any origin >/= 30%      k) Accessory arteries with diameter >/= 260m<89m58mr > 8mm88m        (* this exclusion may only be finally determined during the active              Renal angiogram procedures)             '[]'     Yes     '[x]'    No  2. Iliac/ femoral artery stenosis precluding insertion of the [place] Catheter        '[]'    Yes  '[x]'   No  3. Evidence of active infection within 7 days of procedure  '[]'   Yes  '[x]'   No    4. Secondary hypertension not including sleep apnea  '[]'   Yes   '[x]'   No          (RADISylviay - documented clinical workup  within           The 12 months prior to consent)  5. Type I diabetes mellitus or uncontrolled Type II diabetes '[]'  Yes '[x]'  No       (defined as a Plasma Hb1Ac >/= 9.0%)     6.  Documented history of chronic active inflammatory bowel disorders        Such as Crohn's disease or ulcerative colitis     '[]'  Yes  [  x]  No  7.  eGFR of < 11m/min/1.73m2  (Modification of Diet in Renal Disease formula)          '[]'  Yes     '[x]'   No  8. Brachial circumference >/= 42 cm       '[]'   Yes  '[x]'   No  9. Any History of cerebrovascular event (eg., stroke, TIA, CVA) within         3 months prior to consent     '[]'  Yes    '[x]'   No  10. Any history of sever cardiovascular event (eg. MI, CABG, acute HF requiring        Hospitalization (NYHA III-IV) within 3 months prior to consent)    '[]'   Yes    '[x]'   No  11.  Documented repeat ( >1) hospitalization for hypertensive crisis within          3 months prior to consent       '[]'  Yes  '[x]'   No  12.  Documented confirmed episode(s) of unstable angina (UA) within           3 months prior to consent     '[]'   Yes  '[x]'   No  13.  Documented history of persistent or permanent atrial tachyarrhythmia:  '[]'   Yes    '[x]'   No  14.  Prior treatment with a medical device to treat hypertension (Prior renal          Denervation, Baroreceptor Stimulator, Mobius HD, or ROX device)     '[]'   Yes  '[x]'   No  15.  Active implantable medical device (eg., ICD or CRT-D, neuromuodulator/          Spinal stimulator, baroreflex stimulator)   '[]'   Yes  '[x]'   No  16.  Chronic oxygen support or mechanical  ventilation other than nocturnal            Respiratory support for sleep apnea (CPAP)    '[]'   Yes  '[x]'   No  17.  Primary pulmonary hypertension   '[]'  Yes  '[x]'   No  18.  Documented contraindication or allergy to contrast medium NOT           Amenable to treatment    '[]'   Yes  '[x]'   No  19. Limited life expectancy of < 1 year at the discretion of PI  '[]'  Yes '[x]'  No  20.  Night Shift worker   '[]'  Yes  '[x]'  No  21. Any known, unresolved history of drug use or alcohol dependency, lacks      The ability to comprehend or follow instructions, or for any reason in the opinion       Of the investigator, would be unlikely or unable to comply with study protocol       Requirements or whose participation may result in data analysis confounders        '[]'   Yes    '[x]'   No  22.  Pregnant, nursing or planning to become pregnant (documented negative          Pregnancy test required documented within a maximum of 7 days prior          To  procedure for all women of childbearing potential. Documentation of          Effective contraception is also required)  '[]'   Yes  '[x]'   No                      '[]'   N/A   Reason:_____________  23.  Concurrent enrollment in any other investigational drug or device trial        (Participation in non-interventional Registries is acceptable)  '[]'  Yes  '[x]'   No   MEDICATIONS:  document all medications presently taking including   All OTC, PRN, and Herbal remedies.   ANTIHYPERTENSION MEDICATION CLASSES  Select patient's study cohort: '[]'  RADIANCE II (0-2 meds)                         '[x]'  RADIANCE TRIO (3+ meds)   RADIANCE II Cohort     ( '[x]'  Not applicable, skip to RADIANCE TRIO)  Does the subject's medication regimen include no more than 2 antihypertensive    Medications of different classes for 4 weeks prior to consent?  '[]'  Yes  '[]'  No  Is the subject on zero antihypertensive medications?  '[]'   Yes '[]'  No     If yes, the subject is on zero antihypertensive medications, is the        Intolerance documented?   '[]'  Yes '[]'  No  Calcium Channel blocker (CCB)  '[]'   Yes  '[]'   No   ACE Inhibitor      '[]'   Yes   '[]'   No  Angiotensin Receptor Blocker (ARB)    '[]'   Yes  '[]'   No  Loop sparing Diuretic     '[]'   Yes  '[]'   No  Thiazide Diuretic        '[]'   Yes  '[]'   No  Any other Hypertension Medication(s):   '[]'   Yes  '[]'   No   If yes, please provide the Medication(s):_______________  RADIANCE TRIO Cohort   ( '[]'  Not Applicable)  Does the subject's medication regimen include 3 antihypertensive medications   Amongst 3 classes to include: CCB, ARB, ACEi and a thiazide or thiazide-   Like diuretic for 4 weeks prior to consent?  '[x]'  Yes  '[]'   No  Was a modification of medication required?  '[]'  Yes  '[x]'   No  Was an intolerance noted for a classification requiring a modification?  '[]'  Yes  '[x]'  No    If yes, what classification of intolerance?     '[]'  CCB '[]'  ARB '[]'  ACEi    If yes, what classification was substitiuted? '[]'  CCB '[]'  ARB '[]'  ACEi  Calcium channel blocker (CCB)     '[x]'  Yes  '[]'   No  ACE inhibitor (ACEi)     '[]'  Yes  '[x]'   No  Angiotensin Receptor Blocker (ARB)  '[x]'  Yes '[]'  No  Loop sparing Diuretic   '[]'  Yes  '[x]'  No  Thiazide Diuretic    '[x]'  Yes  '[]'   No  Any other hypertension Medication (s)   '[]'  Yes  '[]'   No    If yes, specify medication(s):____________________  Anticoagulants   '[]'  Yes  '[x]'   No  Antiplatelet    '[]'   Yes  '[x]'   No  Digitalis    '[]'   Yes  '[x]'   No   Vasodilator    '[]'   Yes  '[x]'   No  Lipid Lowering  '[x]'   Yes  '[]'   No  Any other Medication(s)   '[]'   Yes  '[]'   No   If yes, please specify:___________________________    High BP Action   Did the subject meet the safety escape criteria as defined in protocol?  Safety Escape Criteria defined as: Clinical events considered to be related to  persistent or elevated hypertension with blood pressure defined by any of the following:   Average 7-day home BP ?659 (systolic) or ?935 mmHg (diastolic), and subsequently confirmed  by an average Office BP ?701 (systolic) or ?[id number] (diastolic).  OR   Daytime ABP ?009 mmHg (systolic) or ?233 mmHg (diastolic)       '[]'   Yes  '[x]'   No  If Yes, please add an AE and exclude the subject      Subject Continuation   Were any adverse events reported at this visit?  If Yes, please complete an Adverse Event Log CRF      '[]'   Yes  '[x]'   No  Has the patient been hospitalized or seen at any clinic or doctor's office since the previous visit?   ?    '[]'   Yes  '[x]'   No   Were any deviations observed at this visit?  If Yes, please complete a Deviation Log CRF      '[]'   Yes  '[x]'   No  Were there any changes to the patient's medications noted during this visit? If Yes, please add to the Medication Log CRF      '[]'   Yes  '[x]'   No  If the subject is not continuing in the study, please complete the End of Study form      Reminder for Baseline Visit (28 3 days post V0)      Instructions for the next visit appointment:  It is strongly recommended that the next visit (Baseline Eligibility visit) occur between 08:00 and 10:00am following an 8-hour overnight fast. If the visit does not occur in the morning, the same visit time will be repeated for all subsequent visits where blood pressure is measured.   Subjects will be asked to NOT take their morning dose of antihypertensive medication and to instead bring their medication to the V1 visit. Subjects will take as a witnessed pill intake.   Please remind the patient 7 days before the visit to start to measure their Home Blood   Pressure on CHOSEN study arm twice in the morning before antihypertensive medication and twice in the evening before antihypertensive medication   Please remind the subject to wear loose comfortable clothing for the ABP reading and that cuff must remain in place for 25 hours   Subjects will complete SF-12 questionnaire   Subjects will provide urine sample for antihypertensive drug screening   The site may contact  the patient 24 hours prior to the visit to remind them of the need for overnight fasting   During the Baseline Eligibility visit, all subjects will undergo a seated OBP and an Ambulatory Blood Pressure (ABP) measurement and will be requested to return the ABP device within approximately 24 hours. Since this ABP data marks the point of eligibility for the study, it is strongly recommended that subjects return to the clinical center with the ABP device.

## 2021-11-13 DIAGNOSIS — G4733 Obstructive sleep apnea (adult) (pediatric): Secondary | ICD-10-CM | POA: Diagnosis not present

## 2021-11-21 ENCOUNTER — Encounter (HOSPITAL_BASED_OUTPATIENT_CLINIC_OR_DEPARTMENT_OTHER): Payer: Self-pay

## 2021-11-21 ENCOUNTER — Ambulatory Visit (HOSPITAL_COMMUNITY)
Admission: RE | Admit: 2021-11-21 | Discharge: 2021-11-21 | Disposition: A | Discharge status: Home / Self Care | Payer: PPO | Source: Ambulatory Visit | Attending: Cardiovascular Disease | Admitting: Cardiovascular Disease

## 2021-11-21 DIAGNOSIS — N281 Cyst of kidney, acquired: Secondary | ICD-10-CM | POA: Insufficient documentation

## 2021-11-21 DIAGNOSIS — Q453 Other congenital malformations of pancreas and pancreatic duct: Secondary | ICD-10-CM | POA: Diagnosis not present

## 2021-11-21 MED ORDER — GADOBUTROL 1 MMOL/ML IV SOLN
10.0000 mL | Freq: Once | INTRAVENOUS | Status: AC | PRN
  Administered 2021-11-21: 10 mL via INTRAVENOUS

## 2021-11-29 NOTE — Progress Notes (Signed)
Advanced [facility]pertension Clinic Follow-up:    Date:  12/02/2021   ID:  Alan Spencer, DOB 10/18/1955, MRN [id number]  PCP:  Alan Lopes, MD  Cardiologist:  None  Nephrologist:  Referring MD: Alan Lopes, MD   CC: Hypertension  History of Present Illness:    Alan Spencer is a 66 y.o. male with a hx of hypertrophic cardiomyopathy, hypertension, hyperparathyroidism, hyperlipidemia, and OSA on CPAP, here for follow-up. He was initially seen 11/07/2021 in the [facility]. He has struggled with resistant hypertension in the 160s-170s. He had renal artery dopplers 06/2021 that had normal blood flow. Dr. Gwenlyn Spencer referred him to the [facility]. He also had an echo 04/2021 suggestive of HCM. LVEF was 70-75% with moderate hypertrophy of the basal septum (1.45cm). He had a mild gradient with peak velocity of 1.6 m/s that increased to 3.2 m/s with valsalva. He was referred to Dr. Jackqulyn Spencer and had a cardiac MRI 08/2021 that was felt to be consistent with HCM. There was no LGE. He previously had a coronary CT 2/23 with minimal plaque in the RCA. His calcium score was 40 which was 41st percentile.   At his last visit he reported having more energy off of carvedilol. It was thought he would be a very good candidate for renal denervation. We recommended enrollment in the Radiance trio trial.  For this, spironolactone was stopped. In the interim HCTZ was increased to 25 mg. Today, he reports that his blood pressure has been higher lately. He had been taking prednisone due to having a recent root canal. In clinic today his blood pressure is elevated at 173/83 (168/64 on recheck), which he attributes to taking prednisone. He took his final dose of prednisone after lunch earlier today. He denies any palpitations, chest pain, shortness of breath, or peripheral edema. No lightheadedness, headaches, syncope, orthopnea, or PND.   Past Medical History:  Diagnosis Date    Anxiety    Essential hypertension, benign    Excessive daytime sleepiness 07/03/2015   Family history of colon cancer    HNP (herniated nucleus pulposus), lumbar    L4-L5   Mixed hyperlipidemia    Obesity (BMI 30-39.9) 01/10/2018   Obstructive hypertrophic cardiomyopathy ([place]) 11/07/2021   Rectal bleeding    Resistant hypertension    Essential hypertension   Sleep apnea    uses CPAP    Past Surgical History:  Procedure Laterality Date   broken left elbow     NASAL SEPTUM SURGERY  2022   PARATHYROIDECTOMY N/A 12/27/2020   Procedure: NECK EXPLORATION WITH BIOPSY OF PARATHYROID GLAND x3;  Surgeon: Alan Gemma, MD;  Location: [facility];  Service: General;  Laterality: N/A;   TONSILLECTOMY      Current Medications: Current Meds  Medication Sig   amLODipine (NORVASC) 10 MG tablet Take 1 tablet by mouth once daily   Armodafinil 250 MG tablet Take 250 mg by mouth as needed.   Coenzyme Q10 (COQ10) 100 MG CAPS Take 100 mg by mouth daily.   fluticasone (FLONASE) 50 MCG/ACT nasal spray Place 1 spray into both nostrils daily.   Ginkgo Biloba 500 MG CAPS Take 500 mg by mouth as needed.   glucosamine-chondroitin 500-400 MG tablet Take 1 tablet by mouth daily.   hydrochlorothiazide (HYDRODIURIL) 12.5 MG tablet Take 1 tablet (12.5 mg total) by mouth daily.   irbesartan-hydrochlorothiazide (AVALIDE) 300-12.5 MG tablet Take 1 tablet by mouth once a day   metoprolol (TOPROL XL) 200 MG 24 hr  tablet Take 1 tablet by mouth daily.   Multiple Vitamins-Minerals (MULTIVITAMIN WITH MINERALS) tablet Take 1 tablet by mouth daily.   omega-3 acid ethyl esters (LOVAZA) 1 g capsule Take 2 g by mouth 2 (two) times daily.   rosuvastatin (CRESTOR) 20 MG tablet TAKE ONE TABLET BY MOUTH ONCE DAILY.     Allergies:   Patient has no known allergies.   Social History   Socioeconomic History   Marital status: Married    Spouse name: Not on file   Number of children: Not on file   Years of education: Not on file    Highest education level: Not on file  Occupational History   Not on file  Tobacco Use   Smoking status: Never   Smokeless tobacco: Never  Vaping Use   Vaping Use: Never used  Substance and Sexual Activity   Alcohol use: Yes    Comment: occas.   Drug use: No   Sexual activity: Not on file  Other Topics Concern   Not on file  Social History Narrative   Not on file   Social Determinants of Health   Financial Resource Strain: Low Risk  (11/07/2021)   Overall Financial Resource Strain (CARDIA)    Difficulty of Paying Living Expenses: Not hard at all  Food Insecurity: No Food Insecurity (11/07/2021)   Hunger Vital Sign    Worried About Running Out of Food in the Last Year: Never true    [place] in the Last Year: Never true  Transportation Needs: Unknown (11/07/2021)   PRAPARE - Hydrologist (Medical): No    Lack of Transportation (Non-Medical): Not on file  Physical Activity: Sufficiently Active (11/07/2021)   Exercise Vital Sign    Days of Exercise per Week: 7 days    Minutes of Exercise per Session: 70 min  Recent Concern: Physical Activity - Insufficiently Active (11/07/2021)   Exercise Vital Sign    Days of Exercise per Week: 4 days    Minutes of Exercise per Session: 30 min  Stress: Not on file  Social Connections: Not on file     Family History: The patient's family history includes Colon cancer in his paternal grandmother; Hypertension in his mother; Hypertrophic cardiomyopathy in his mother; Kidney failure in his father.  ROS:   Please see the history of present illness.    All other systems reviewed and are negative.  EKGs/Labs/Other Studies Reviewed:    Cardiac MRI 09/23/21: FINDINGS: 1. Normal left ventricular size, with LVEDD 46 mm, and LVEDVi 46 mL/m2.   Normal left ventricular thickness, with intraventricular septal thickness of 15 mm, posterior wall thickness of 10 mm, though myocardial mass index normal for gender  at 65 g/m2.   Normal left ventricular systolic function (LVEF =10%). There are no regional wall motion abnormalities. There is late peaking chordal systolic motion of the anterior mitral valve (chordal SAM).   Left ventricular parametric mapping notable for normal ECV signal and normal T2 signal.   There is no late gadolinium enhancement in the left ventricular myocardium. Six standard deviation reference used.   2. Normal right ventricular size with RVEDVI 87 mL/m2.   Normal right ventricular thickness.   Normal right ventricular systolic function (RVEF =93%). There are no regional wall motion abnormalities or aneurysms.   3.  Normal left and right atrial size.   4. Normal size of the aortic root, ascending aorta and pulmonary artery.   5. Valve assessment:  Aortic Valve: Tri-leaflet aortic valve. Regurgitant fraction 14%, mean gradient of 1 mm Hg. Mild central aortic regurgitation.   Pulmonic Valve: Regurgitant fraction 2%, mean gradient < 1 mm Hg. Qualitatively, there is no significant regurgitation.   Tricuspid Valve: Regurgitant fraction 10%. Mild central tricuspid regurgitation   Mitral Valve: Regurgitant fraction 16%.  Mild mitral regurgitation.   6.  Normal pericardium.  No pericardial effusion.   7. Grossly, no extracardiac findings. Recommended dedicated study if concerned for non-cardiac pathology.   IMPRESSION: In absence of system disease leading to hypertrophy, no evidence of infiltrative disease and consistent with hypertrophic cardiomyopathy.   There is qualitatively turbulent LVOT flow often seen in hypertrophic cardiomyopathy.   No evidence of scar.  Renal Artery Doppler 07/02/2021: Summary:  Largest Aortic Diameter: 1.9 cm     Renal:     Right: Normal size right kidney. Normal cortical thickness of right         kidney. No evidence of right renal artery stenosis. Cyst(s)         noted. RRV flow present. Abnormal right Resistive Index.          Avascular cystic structure in lower polen of the kidney,         measuring 2.7 x 2.0 x 3.3 cm.  Left:  Normal size of left kidney. Normal cortical thickness of the         left kidney. No evidence of left renal artery stenosis. LRV         flow present. Abnormal left Resisitve Index.  Mesenteric:  Normal Celiac artery and Superior Mesenteric artery findings.     Patent IVC.  Cardiac CTA 04/26/2021: FINDINGS: Non-cardiac: See separate report from [facility]. No significant findings on limited lung and soft tissue windows.   Calcium score: Mild 2 vessel calcium noted   LM 0   LAD 0.305   LCX 0   RCA 39.5   Total: 39.8 which is 41 st percentile for age/sex   Coronary Arteries: Right dominant with no anomalies   LM: Normal   LAD: 1-24% calcified plaque in mid vessel   D1: Normal   D2: Normal   D3 Normal   Circumflex: Normal   OM1: Normal   OM2: Normal   OM3: Normal   RCA: 1-24% calcified plaque in proximal, mid and distal vessel   PDA: Normal   PLA: Normal   IMPRESSION: 1. Calcium score 39.8 which is 41 st percentile for age/sex   2.  Normal ascending thoracic aorta 3.6 cm   3.  CAD RADS 1 non obstructive CAD see description above  Echocardiogram 04/19/2021: IMPRESSIONS     1. Moderate hypertophy of the basal septum with otherwise mild concentric  LVH. Mild intracavitary gradient. Peak velocity 1.6 m/s. Peak gradient 10  mmHg. Velocity increases to 3.2 m/s and peak gradient increases to 41.2  mmHg. Left ventricular ejection  fraction, by estimation, is 70 to 75%. The left ventricle has hyperdynamic  function. The left ventricle has no regional wall motion abnormalities.  There is moderate left ventricular hypertrophy of the basal-septal  segment. Left ventricular diastolic  parameters are consistent with Grade I diastolic dysfunction (impaired  relaxation). The average left ventricular global longitudinal strain is  -20.4 %. The global  longitudinal strain is normal.   2. Right ventricular systolic function is normal. The right ventricular  size is mildly enlarged. There is normal pulmonary artery systolic  pressure.   3. Left atrial size was severely dilated.  4. Right atrial size was severely dilated.   5. The mitral valve is normal in structure. Trivial mitral valve  regurgitation. No evidence of mitral stenosis.   6. The aortic valve is tricuspid. There is mild calcification of the  aortic valve. There is mild thickening of the aortic valve. Aortic valve  regurgitation is not visualized. No aortic stenosis is present.   7. Aortic dilatation noted. There is mild dilatation of the ascending  aorta, measuring 37 mm.   8. The inferior vena cava is dilated in size with >50% respiratory  variability, suggesting right atrial pressure of 8 mmHg.     EKG:  EKG is personally reviewed. 12/02/2021:  EKG was not ordered. 11/07/2021:  EKG was not ordered.  Recent Labs: 12/28/2020: BUN 21; Creatinine, Ser 1.06; Potassium 3.5; Sodium 139 07/30/2021: Hemoglobin 15.8; Platelets 222   Recent Lipid Panel No results Spencer for: "CHOL", "TRIG", "HDL", "CHOLHDL", "VLDL", "LDLCALC", "LDLDIRECT"  Physical Exam:    VS:  BP (!) 168/64 (BP Location: Right Arm, Patient Position: Sitting, Cuff Size: Large)   Pulse 62   Ht '5\' 8"'$  (1.727 m)   Wt 209 lb 3.2 oz (94.9 kg)   SpO2 95%   BMI 31.81 kg/m  , BMI Body mass index is 31.81 kg/m. GENERAL:  Well appearing HEENT: Pupils equal round and reactive, fundi not visualized, oral mucosa unremarkable NECK:  No jugular venous distention, waveform within normal limits, carotid upstroke brisk and symmetric, no bruits, no thyromegaly LUNGS:  Clear to auscultation bilaterally HEART:  RRR.  PMI not displaced or sustained,S1 and S2 within normal limits, no S3, no S4, no clicks, no rubs, III/VI systolic murmur at the LUSB ABD:  Flat, positive bowel sounds normal in frequency in pitch, no bruits, no  rebound, no guarding, no midline pulsatile mass, no hepatomegaly, no splenomegaly EXT:  2 plus pulses throughout, no edema, no cyanosis no clubbing SKIN:  No rashes no nodules NEURO:  Cranial nerves II through XII grossly intact, motor grossly intact throughout PSYCH:  Cognitively intact, oriented to person place and time  ASSESSMENT/PLAN:    Obstructive hypertrophic cardiomyopathy (HCC) Systolic murmur noted on exam.  Continue metoprolol.  Blood pressure control as above.  Resistant hypertension Blood pressure remains uncontrolled.  He is in the enrollment process for the Radiance trio study.  No medication changes should occur to his blood pressure medication until he has his follow-up visit with our research team.  Continue amlodipine, hydrochlorothiazide, irbesartan, and metoprolol.  He is on the metoprolol for hypertrophic cardiomyopathy.  At his follow-up he will need an ambulatory blood pressure monitor.  If for some reason he does not meet the criteria for the study he will need to be seen by our office sooner so that we can make the appropriate medication adjustments.  Otherwise, plan to follow-up in 2 months.  Blood pressure is higher today than usual because he had to take prednisone for a root canal.  Continue working on diet and exercise.  OSA on CPAP Continue CPAP.   Screening for Secondary Hypertension:     11/07/2021   11:53 AM  Causes  Drugs/Herbals Screened     - Comments not salt conscious, minimal EtOH, minimal caffeine  Renovascular HTN Screened     - Comments renal Dopplers negative  Sleep Apnea Screened     - Comments uses CPAP  Thyroid Disease Screened     - Comments will send TSH from PCP    Relevant Labs/Studies:  Latest Ref Rng & Units 12/28/2020    3:55 AM 12/21/2020    1:55 PM 09/15/2015    9:34 PM  Basic Labs  Sodium 135 - 145 mmol/L 139  142  140   Potassium 3.5 - 5.1 mmol/L 3.5  3.5  3.1   Creatinine 0.61 - 1.24 mg/dL 1.06  0.92  0.89            Latest Ref Rng & Units 07/30/2021   12:03 PM  Renin/Aldosterone   Aldosterone 0.0 - 30.0 ng/dL 6.7   Renin 0.167 - 5.380 ng/mL/hr 0.290   Aldos/Renin Ratio 0.0 - 30.0 23.1              07/02/2021   12:40 PM  Renovascular   Renal Artery [place] Completed Yes     Disposition:    FU with Jeremia Groot C. Oval Linsey, MD, [facility] in 2 months.   Medication Adjustments/Labs and Tests Ordered: Current medicines are reviewed at length with the patient today.  Concerns regarding medicines are outlined above.   No orders of the defined types were placed in this encounter.  No orders of the defined types were placed in this encounter.  I,Mathew Stumpf,acting as a Education administrator for Skeet Latch, MD.,have documented all relevant documentation on the behalf of Skeet Latch, MD,as directed by  Skeet Latch, MD while in the presence of Skeet Latch, MD.  I, Lares Oval Linsey, MD have reviewed all documentation for this visit.  The documentation of the exam, diagnosis, procedures, and orders on 12/02/2021 are all accurate and complete.  Signed, Skeet Latch, MD  12/02/2021 6:25 PM    [place]

## 2021-12-02 ENCOUNTER — Ambulatory Visit (HOSPITAL_BASED_OUTPATIENT_CLINIC_OR_DEPARTMENT_OTHER): Payer: PPO | Admitting: Cardiovascular Disease

## 2021-12-02 ENCOUNTER — Encounter (HOSPITAL_BASED_OUTPATIENT_CLINIC_OR_DEPARTMENT_OTHER): Payer: Self-pay | Admitting: Cardiovascular Disease

## 2021-12-02 DIAGNOSIS — G4733 Obstructive sleep apnea (adult) (pediatric): Secondary | ICD-10-CM

## 2021-12-02 DIAGNOSIS — Z9989 Dependence on other enabling machines and devices: Secondary | ICD-10-CM

## 2021-12-02 DIAGNOSIS — I10 Essential (primary) hypertension: Secondary | ICD-10-CM

## 2021-12-02 DIAGNOSIS — I1A0 Resistant hypertension: Secondary | ICD-10-CM

## 2021-12-02 DIAGNOSIS — I421 Obstructive hypertrophic cardiomyopathy: Secondary | ICD-10-CM

## 2021-12-02 NOTE — Assessment & Plan Note (Signed)
Continue CPAP.  

## 2021-12-02 NOTE — Patient Instructions (Signed)
Medication Instructions:  Your physician recommends that you continue on your current medications as directed. Please refer to the Current Medication list given to you today.   Labwork: NONE  Testing/Procedures: NONE  Follow-Up: 02/05/2022 3:30 PM WITH DR [facility]   Any Other Special Instructions Will Be Listed Below (If Applicable).  IF YOU DO NOT QUALIFY FOR THE STUDY CALL THE OFFICE TO GET SOONER APPOINTMENT   If you need a refill on your cardiac medications before your next appointment, please call your pharmacy.

## 2021-12-02 NOTE — Assessment & Plan Note (Addendum)
Blood pressure remains uncontrolled.  He is in the enrollment process for the Radiance trio study.  No medication changes should occur to his blood pressure medication until he has his follow-up visit with our research team.  Continue amlodipine, hydrochlorothiazide, irbesartan, and metoprolol.  He is on the metoprolol for hypertrophic cardiomyopathy.  At his follow-up he will need an ambulatory blood pressure monitor.  If for some reason he does not meet the criteria for the study he will need to be seen by our office sooner so that we can make the appropriate medication adjustments.  Otherwise, plan to follow-up in 2 months.  Blood pressure is higher today than usual because he had to take prednisone for a root canal.  Continue working on diet and exercise.

## 2021-12-02 NOTE — Assessment & Plan Note (Signed)
Systolic murmur noted on exam.  Continue metoprolol.  Blood pressure control as above.

## 2021-12-11 ENCOUNTER — Encounter: Payer: Self-pay | Admitting: Cardiovascular Disease

## 2021-12-11 ENCOUNTER — Ambulatory Visit: Payer: PPO | Attending: Cardiovascular Disease

## 2021-12-11 ENCOUNTER — Other Ambulatory Visit: Payer: Self-pay | Admitting: Cardiovascular Disease

## 2021-12-11 ENCOUNTER — Ambulatory Visit: Payer: PPO | Attending: Cardiovascular Disease | Admitting: Cardiovascular Disease

## 2021-12-11 VITALS — BP 156/84 | HR 54 | Ht 68.0 in | Wt 210.2 lb

## 2021-12-11 DIAGNOSIS — Z9989 Dependence on other enabling machines and devices: Secondary | ICD-10-CM | POA: Diagnosis not present

## 2021-12-11 DIAGNOSIS — E782 Mixed hyperlipidemia: Secondary | ICD-10-CM | POA: Diagnosis not present

## 2021-12-11 DIAGNOSIS — I455 Other specified heart block: Secondary | ICD-10-CM

## 2021-12-11 DIAGNOSIS — I421 Obstructive hypertrophic cardiomyopathy: Secondary | ICD-10-CM

## 2021-12-11 DIAGNOSIS — R001 Bradycardia, unspecified: Secondary | ICD-10-CM

## 2021-12-11 DIAGNOSIS — I10 Essential (primary) hypertension: Secondary | ICD-10-CM | POA: Diagnosis not present

## 2021-12-11 DIAGNOSIS — G4733 Obstructive sleep apnea (adult) (pediatric): Secondary | ICD-10-CM

## 2021-12-11 DIAGNOSIS — R931 Abnormal findings on diagnostic imaging of heart and coronary circulation: Secondary | ICD-10-CM | POA: Diagnosis not present

## 2021-12-11 NOTE — Assessment & Plan Note (Signed)
History of obstructive sleep apnea on CPAP. 

## 2021-12-11 NOTE — Patient Instructions (Addendum)
Medication Instructions:  No changes *If you need a refill on your cardiac medications before your next appointment, please call your pharmacy*   Lab Work: None ordered If you have labs (blood work) drawn today and your tests are completely normal, you will receive your results only by: [place] (if you have MyChart) OR A paper copy in the mail If you have any lab test that is abnormal or we need to change your treatment, we will call you to review the results.   Testing/Procedures:    Follow-Up: At [facility], you and your health needs are our priority.  As part of our continuing mission to provide you with exceptional heart care, we have created designated Provider Care Teams.  These Care Teams include your primary Cardiologist (physician) and Advanced Practice Providers (APPs -  Physician Assistants and Nurse Practitioners) who all work together to provide you with the care you need, when you need it.  We recommend signing up for the patient portal called "MyChart".  Sign up information is provided on this After Visit Summary.  MyChart is used to connect with patients for Virtual Visits (Telemedicine).  Patients are able to view lab/test results, encounter notes, upcoming appointments, etc.  Non-urgent messages can be sent to your provider as well.   To learn more about what you can do with MyChart, go to [email or web address].    Your next appointment:   6 month(s)  The format for your next appointment:   In Person  Provider:   Dr. Norina Buzzard- Long Term Monitor Instructions  Your physician has requested you wear a ZIO patch monitor for 14 days.  This is a single patch monitor. [employer] supplies one patch monitor per enrollment. Additional stickers are not available. Please do not apply patch if you will be having a Nuclear Stress Test,  Echocardiogram, Cardiac CT, MRI, or Chest Xray during the period you would be wearing the  monitor. The patch cannot  be worn during these tests. You cannot remove and re-apply the  ZIO XT patch monitor.  Your ZIO patch monitor will be mailed 3 day USPS to your address on file. It may take 3-5 days  to receive your monitor after you have been enrolled.  Once you have received your monitor, please review the enclosed instructions. Your monitor  has already been registered assigning a specific monitor serial # to you.  Billing and Patient Assistance Program Information  We have supplied [employer] with any of your insurance information on file for billing purposes. [employer] offers a sliding scale Patient Assistance Program for patients that do not have  insurance, or whose insurance does not completely cover the cost of the ZIO monitor.  You must apply for the Patient Assistance Program to qualify for this discounted rate.  To apply, please call [employer] at [phone], select option 4, select option 2, ask to apply for  Patient Assistance Program. Theodore Demark will ask your household income, and how many people  are in your household. They will quote your out-of-pocket cost based on that information.  [employer] will also be able to set up a 23-month interest-free payment plan if needed.  Applying the monitor   Shave hair from upper left chest.  Hold abrader disc by orange tab. Rub abrader in 40 strokes over the upper left chest as  indicated in your monitor instructions.  Clean area with 4 enclosed alcohol pads. Let dry.  Apply patch as indicated in monitor instructions. Patch will be  placed under collarbone on left  side of chest with arrow pointing upward.  Rub patch adhesive wings for 2 minutes. Remove white label marked "1". Remove the white  label marked "2". Rub patch adhesive wings for 2 additional minutes.  While looking in a mirror, press and release button in center of patch. A small green light will  flash 3-4 times. This will be your only indicator that the monitor has been turned on.  Do not shower  for the first 24 hours. You may shower after the first 24 hours.  Press the button if you feel a symptom. You will hear a small click. Record Date, Time and  Symptom in the Patient Logbook.  When you are ready to remove the patch, follow instructions on the last 2 pages of Patient  Logbook. Stick patch monitor onto the last page of Patient Logbook.  Place Patient Logbook in the blue and white box. Use locking tab on box and tape box closed  securely. The blue and white box has prepaid postage on it. Please place it in the mailbox as  soon as possible. Your physician should have your test results approximately 7 days after the  monitor has been mailed back to [facility].  Call [place] at [phone] if you have questions regarding  your ZIO XT patch monitor. Call them immediately if you see an orange light blinking on your  monitor.  If your monitor falls off in less than 4 days, contact our Monitor department at [phone].  If your monitor becomes loose or falls off after 4 days call [employer] at [phone] for  suggestions on securing your monitor

## 2021-12-11 NOTE — Progress Notes (Signed)
12/11/2021 Alan Spencer   1955-06-06  [id number]  Primary Physician Donnajean Lopes, MD Primary Cardiologist: Lorretta Harp MD Lupe Carney, [place]  HPI:  Alan Spencer is a 66 y.o.  mildly overweight married Caucasian male with no children who is a Environmental education officer at [employer].  He was referred by Dr. Inocencio Homes, his cardiologist, for cardiovascular evaluation because of dyspnea.  I last saw him in the office 06/14/2021.  His risk factors include treated hypertension hyperlipidemia.  There is no family history of heart disease.  Is never had a heart attack or stroke.  Denies chest pain but does get some dyspnea when exercising especially when playing tennis.  He does have sleep apnea on CPAP.  Since I saw him in the office 6 months ago I did refer him to Dr. Oval Linsey in the hypertension clinic and Dr.Chandrasekhar for evaluation of HOCM.  He did have a cardiac MRI in June that showed changes consistent with hypertrophic cardiomyopathy.  He is fairly active and plays tennis without limitation.  He had a coronary calcium score of 40, and based on this we were more aggressive lipid management.  He is being considered for the Radiance trial for more aggressive hypertension management.     Current Meds  Medication Sig   amLODipine (NORVASC) 10 MG tablet Take 1 tablet by mouth once daily   Armodafinil 250 MG tablet Take 250 mg by mouth as needed.   Coenzyme Q10 (COQ10) 100 MG CAPS Take 100 mg by mouth daily.   glucosamine-chondroitin 500-400 MG tablet Take 1 tablet by mouth daily.   hydrochlorothiazide (HYDRODIURIL) 12.5 MG tablet Take 1 tablet (12.5 mg total) by mouth daily.   irbesartan-hydrochlorothiazide (AVALIDE) 300-12.5 MG tablet Take 1 tablet by mouth once a day   metoprolol (TOPROL XL) 200 MG 24 hr tablet Take 1 tablet by mouth daily.   omega-3 acid ethyl esters (LOVAZA) 1 g capsule Take 2 g by mouth 2 (two) times daily.   rosuvastatin (CRESTOR) 20 MG  tablet TAKE ONE TABLET BY MOUTH ONCE DAILY.     No Known Allergies  Social History   Socioeconomic History   Marital status: Married    Spouse name: Not on file   Number of children: Not on file   Years of education: Not on file   Highest education level: Not on file  Occupational History   Not on file  Tobacco Use   Smoking status: Never   Smokeless tobacco: Never  Vaping Use   Vaping Use: Never used  Substance and Sexual Activity   Alcohol use: Yes    Comment: occas.   Drug use: No   Sexual activity: Not on file  Other Topics Concern   Not on file  Social History Narrative   Not on file   Social Determinants of Health   Financial Resource Strain: Low Risk  (11/07/2021)   Overall Financial Resource Strain (CARDIA)    Difficulty of Paying Living Expenses: Not hard at all  Food Insecurity: No Food Insecurity (11/07/2021)   Hunger Vital Sign    Worried About Running Out of Food in the Last Year: Never true    [place] in the Last Year: Never true  Transportation Needs: Unknown (11/07/2021)   PRAPARE - Hydrologist (Medical): No    Lack of Transportation (Non-Medical): Not on file  Physical Activity: Sufficiently Active (11/07/2021)   Exercise Vital  Sign    Days of Exercise per Week: 7 days    Minutes of Exercise per Session: 70 min  Recent Concern: Physical Activity - Insufficiently Active (11/07/2021)   Exercise Vital Sign    Days of Exercise per Week: 4 days    Minutes of Exercise per Session: 30 min  Stress: Not on file  Social Connections: Not on file  Intimate Partner Violence: Not on file     Review of Systems: General: negative for chills, fever, night sweats or weight changes.  Cardiovascular: negative for chest pain, dyspnea on exertion, edema, orthopnea, palpitations, paroxysmal nocturnal dyspnea or shortness of breath Dermatological: negative for rash Respiratory: negative for cough or wheezing Urologic: negative for  hematuria Abdominal: negative for nausea, vomiting, diarrhea, bright red blood per rectum, melena, or hematemesis Neurologic: negative for visual changes, syncope, or dizziness All other systems reviewed and are otherwise negative except as noted above.    Blood pressure (!) 156/84, pulse (!) 54, height '5\' 8"'$  (1.727 m), weight 210 lb 3.2 oz (95.3 kg), SpO2 98 %.  General appearance: alert and no distress Neck: no adenopathy, no carotid bruit, no JVD, supple, symmetrical, trachea midline, and thyroid not enlarged, symmetric, no tenderness/mass/nodules Lungs: clear to auscultation bilaterally Heart: Soft outflow tract murmur consistent with subaortic stenosis. Extremities: extremities normal, atraumatic, no cyanosis or edema Pulses: 2+ and symmetric Skin: Skin color, texture, turgor normal. No rashes or lesions Neurologic: Grossly normal  EKG sinus bradycardia 54 with LVH voltage, and septal Q waves.  He did have 2 to 3-second pauses.  I personally reviewed this EKG.  ASSESSMENT AND PLAN:   Resistant hypertension History of essential hypertension a blood pressure measured today at 156/84.  I did review his home blood pressure log which showed persistently elevated blood pressures similar to this or slightly higher.  He is on amlodipine, hydrochlorothiazide, Avalide and metoprolol.  He was taken off spironolactone in anticipation of participating in the Radiance trial (renal dose ovation artery denervation) being read by Dr. Oval Linsey and Dr. Fletcher Anon.  Renal Dopplers performed/19/23 showed no evidence of renal artery stenosis.  Mixed hyperlipidemia History of hyperlipidemia on statin therapy with lipid profile performed 08/06/2021 revealing total cholesterol of 149, LDL 57 and HDL of 32.  OSA on CPAP History of obstructive sleep apnea on CPAP  Obstructive hypertrophic cardiomyopathy (HCC) 2D echo has shown normal LV systolic function with inducible subaortic gradient and basal septal  hypertrophy.  I did refer him to Dr.Chandrasekhar for further evaluation of this.  Cardiac MRI performed 09/04/2021 showed changes consistent with HOCM.  Elevated coronary artery calcium score Coronary calcium score performed 04/26/2021 was 40.  He is completely asymptomatic.  He is very active and plays tennis.  Based on this we have suggested that he have a LDL less than 70.     Lorretta Harp MD FACP,FACC,FAHA, [facility] 12/11/2021 11:11 AM

## 2021-12-11 NOTE — Assessment & Plan Note (Signed)
2D echo has shown normal LV systolic function with inducible subaortic gradient and basal septal hypertrophy.  I did refer him to Dr.Chandrasekhar for further evaluation of this.  Cardiac MRI performed 09/04/2021 showed changes consistent with HOCM.

## 2021-12-11 NOTE — Progress Notes (Unsigned)
Enrolled for [employer] to mail a ZIO XT long term holter monitor to the patients address on file.  

## 2021-12-11 NOTE — Assessment & Plan Note (Signed)
History of hyperlipidemia on statin therapy with lipid profile performed 08/06/2021 revealing total cholesterol of 149, LDL 57 and HDL of 32.

## 2021-12-11 NOTE — Assessment & Plan Note (Signed)
History of essential hypertension a blood pressure measured today at 156/84.  I did review his home blood pressure log which showed persistently elevated blood pressures similar to this or slightly higher.  He is on amlodipine, hydrochlorothiazide, Avalide and metoprolol.  He was taken off spironolactone in anticipation of participating in the Radiance trial (renal dose ovation artery denervation) being read by Dr. Oval Linsey and Dr. Fletcher Anon.  Renal Dopplers performed/19/23 showed no evidence of renal artery stenosis.

## 2021-12-11 NOTE — Assessment & Plan Note (Signed)
Coronary calcium score performed 04/26/2021 was 40.  He is completely asymptomatic.  He is very active and plays tennis.  Based on this we have suggested that he have a LDL less than 70.

## 2021-12-13 VITALS — BP 163/77 | HR 68 | Ht 68.0 in | Wt 210.0 lb

## 2021-12-13 DIAGNOSIS — Z006 Encounter for examination for normal comparison and control in clinical research program: Secondary | ICD-10-CM

## 2021-12-13 NOTE — Research (Cosign Needed Addendum)
RADIANCE CAP TRIAL - [place]  Patient ID:  [place]-   [id number]   -  [id number]               BASELINE VISIT 1  Visit Date:  29-Sep-023  Visit Type:   '[x]'$  In Clinic    '[]'$   Remote Visit  PHYSICAL EXAMINATION  Was the physical examination performed?          '[x]'$   Yes     '[]'$   No        If no, give reason:    Time of Collection:      _8:45____  HH:mm   Weight:    _210_     '[]'$  kg   '[x]'$  Lbs   Abdominal circumference measurement    _110  cm   Heart Rate    _68_ bpm   Office Blood Pressure Measurements        Arm    Systolic BP       (mmHg)      Diastolic BP          (mmHg)        Pulse       (bpm)   1. First-Seated BP  Left 182 92 65   2. Second-Seated BP Left 185 94 68   3. Third-Seated BP Left 183 94 65   4. Fourth-Standing BP Left 172 102 67   Ambulatory Blood Pressure   Has the patient been provided with an Ambulatory Blood Pressure monitor and shown how to use it?         '[x]'$   Yes         '[]'$   No   Was the first ABP recording valid?               (At least 21 valid daytime measurements and 7 nighttime)         '[x]'$   Yes         '[]'$   No            If NO;    '[x]'$  BP did not meet criteria-patient screen-failed    '[]'$  BP did not meet criteria - patient remains in study Lobbyist approval)    '[]'$  Insufficient data collected    '[]'$  Other, specify:______________       If insufficient data collected, is the patient willing to repeat the recording?    '[]'$    Yes    '[]'$    No    Date when the repeat recording was started     _02/oct/2023_     dd/mmm/yyyy    Is the patient's final average daytime ambulatory blood pressure                            >/= 135/85 mmHg?           '[]'$   Yes     '[x]'$   No   *Enter ABPM data from the [facility] report into the [place]. If repeat ABPM is approved, enter a repeat ABPM and Urine for Drug Metabolite as an Unscheduled Visit within the same timeframe.   Home Blood Pressure   Were the Home Blood Pressure Measurements collect?       '[x]'$   Yes      '[]'$   No          If No, specify the reason not done        *Please enter the 7-day  home blood pressure measurements from the diary into the [facility]   High BP Action   Did the subject meet the safety escape criteria as defined in protocol?  Safety Escape Criteria defined as: Home BP >/=865 mmHg systolic or >/= 784 mmHg diastolic at Baseline, associated with clinical events considered to be related to persistent or elevated hypertension AND verified by any of the following additional BP values will result in the subject being excluded:      * Average office BP >/= 696 mmHg systolic or >/= 295 mmHg diastolic.        * Daytime ABP >/= 284 mmHg systolic or >/= 132 mmHg diastolic.              '[]'$   Yes         '[x]'$    No    If yes, please add an AE and exclude the subject    Witnessed Pill Intake    Was the Subject witnessed taking their antihypertensive medication at this visit?       '[x]'$  Yes    '[]'$   No       If No, specify the reason:__________________     SF-QOL Questionnaire   Was the SF-12 questionnaire completed?        '[x]'$   Yes     '[]'$   No     If yes, specify date     _29__/_sep_/_2023_      dd    mmm   yyyy   If no, specify reason:    Urine for Drug Metabolites   Were two (2) 10 mL urine samples for drug metabolites collected?      '[x]'$   Yes  '[]'$   No     Urine sample collection date    __29__/__sep_/2023_      dd    mmm   yyyy    If No, specify reason:     Subject Continuation     Were any adverse events reported at this visit?            If yes, please complete an Adverse Event Log CRF     '[]'$  Yes     '[x]'$   No     Has the patient been hospitalized or seen at any clinic or doctor's off since the previous visit?     '[]'$  Yes     '[x]'$   No   Were any deviations observed at this visit?   If Yes, please complete a Deviation Log CRF     '[]'$  Yes     '[x]'$   No   Were there any changes to the patient's medications noted during this visit?  If yes, please add to the Medication Log CRF     '[]'$  Yes      '[x]'$   No    Will the subject continue to the next visit?     '[x]'$  Yes     '[]'$   No        *If the subject is not continuing in the study, please complete the End of Study Form   Pre-Procedure Testing Reminder    Screening Renal CTA or MRI     '[]'$            * If testing is done at the Baseline visit, please complete that section in the Pre-procedure worksheet.     12-Lead ECG     '[]'$      Blood for Chemistry     '[]'$   Urine for Chemistry     '[]'$       Negative Pregnancy test      '[]'$    N/A '[]'$        Subject is a screen failure

## 2021-12-14 DIAGNOSIS — I421 Obstructive hypertrophic cardiomyopathy: Secondary | ICD-10-CM

## 2021-12-14 DIAGNOSIS — R001 Bradycardia, unspecified: Secondary | ICD-10-CM | POA: Diagnosis not present

## 2021-12-14 DIAGNOSIS — I455 Other specified heart block: Secondary | ICD-10-CM | POA: Diagnosis not present

## 2021-12-18 DIAGNOSIS — Z23 Encounter for immunization: Secondary | ICD-10-CM | POA: Diagnosis not present

## 2022-01-03 ENCOUNTER — Encounter: Payer: Self-pay | Admitting: Internal Medicine

## 2022-01-03 ENCOUNTER — Ambulatory Visit: Payer: PPO | Attending: Internal Medicine | Admitting: Internal Medicine

## 2022-01-03 VITALS — BP 162/68 | HR 57 | Ht 68.0 in | Wt 212.6 lb

## 2022-01-03 DIAGNOSIS — I421 Obstructive hypertrophic cardiomyopathy: Secondary | ICD-10-CM | POA: Diagnosis not present

## 2022-01-03 NOTE — Patient Instructions (Signed)
Medication Instructions:  Your physician recommends that you continue on your current medications as directed. Please refer to the Current Medication list given to you today.  You may start a new medication mavacamten (Camzyos) 5 mg by mouth once daily  *If you need a refill on your cardiac medications before your next appointment, please call your pharmacy*   Lab Work: NONE If you have labs (blood work) drawn today and your tests are completely normal, you will receive your results only by: [employer] (if you have MyChart) OR A paper copy in the mail If you have any lab test that is abnormal or we need to change your treatment, we will call you to review the results.   Testing/Procedures: Your physician has requested that you have an echocardiogram. Echocardiography is a painless test that uses sound waves to create images of your heart. It provides your doctor with information about the size and shape of your heart and how well your heart's chambers and valves are working. This procedure takes approximately one hour. There are no restrictions for this procedure. Please do NOT wear cologne, perfume, aftershave, or lotions (deodorant is allowed). Please arrive 15 minutes prior to your appointment time.    Follow-Up: At [facility], you and your health needs are our priority.  As part of our continuing mission to provide you with exceptional heart care, we have created designated Provider Care Teams.  These Care Teams include your primary Cardiologist (physician) and Advanced Practice Providers (APPs -  Physician Assistants and Nurse Practitioners) who all work together to provide you with the care you need, when you need it.    Your next appointment:   16 week(s)  The format for your next appointment:   In Person  Provider:   Rudean Haskell, MD    Important Information About Sugar

## 2022-01-03 NOTE — Progress Notes (Signed)
Cardiology Office Note:    Date:  01/03/2022   ID:  Alan, Spencer 11/26/1955, MRN [id number]  PCP:  Alan Lopes, MD   [facility] Providers Cardiologist:  Alan Burow, MD     Referring MD: Alan Lopes, MD   CC: hypertrophic cardiomyopathy (vs hypertrophic phenotype with [facility] physiology)  History of Present Illness:    Alan Spencer is a 66 y.o. male with a hx of HCM and uncontrolled HTN who presents for evaluation 07/30/21. Since last visit we have tried to down titrate medications that would affect his LVOT gradient.  No change on MRAs.  Unable to come off diuretics for BP.  Was not a candidate for the Radiance trial (diastolic BP.  Still notes DOE unable to keep him from tennis Notes improved fatigue off Coreg. Notes no palpitations Notes rare CP. Notes  Dizziness that keeps him from reaching for shots in tennis. Notes no syncope.  Past Medical History:  Diagnosis Date   Anxiety    Essential hypertension, benign    Excessive daytime sleepiness 07/03/2015   Family history of colon cancer    HNP (herniated nucleus pulposus), lumbar    L4-L5   Mixed hyperlipidemia    Obesity (BMI 30-39.9) 01/10/2018   Obstructive hypertrophic cardiomyopathy ([place]) 11/07/2021   Rectal bleeding    Resistant hypertension    Essential hypertension   Sleep apnea    uses CPAP    Past Surgical History:  Procedure Laterality Date   broken left elbow     NASAL SEPTUM SURGERY  2022   PARATHYROIDECTOMY N/A 12/27/2020   Procedure: NECK EXPLORATION WITH BIOPSY OF PARATHYROID GLAND x3;  Surgeon: Alan Gemma, MD;  Location: [facility];  Service: General;  Laterality: N/A;   TONSILLECTOMY      Current Medications: Current Meds  Medication Sig   amLODipine (NORVASC) 10 MG tablet Take 1 tablet by mouth once daily   Armodafinil 250 MG tablet Take 250 mg by mouth as needed.   Coenzyme Q10 (COQ10) 100 MG CAPS Take 100 mg by mouth daily.   fluticasone (FLONASE) 50 MCG/ACT  nasal spray Place 1 spray into both nostrils daily as needed.   Ginkgo Biloba 500 MG CAPS Take 500 mg by mouth as needed.   glucosamine-chondroitin 500-400 MG tablet Take 1 tablet by mouth daily.   hydrochlorothiazide (HYDRODIURIL) 12.5 MG tablet Take 1 tablet (12.5 mg total) by mouth daily.   irbesartan-hydrochlorothiazide (AVALIDE) 300-12.5 MG tablet Take 1 tablet by mouth once a day   metoprolol (TOPROL XL) 200 MG 24 hr tablet Take 1 tablet by mouth daily.   Multiple Vitamins-Minerals (MULTIVITAMIN WITH MINERALS) tablet Take 1 tablet by mouth daily.   omega-3 acid ethyl esters (LOVAZA) 1 g capsule Take 2 g by mouth 2 (two) times daily.   rosuvastatin (CRESTOR) 20 MG tablet TAKE ONE TABLET BY MOUTH ONCE DAILY.     Allergies:   Patient has no known allergies.   Social History   Socioeconomic History   Marital status: Married    Spouse name: Not on file   Number of children: Not on file   Years of education: Not on file   Highest education level: Not on file  Occupational History   Not on file  Tobacco Use   Smoking status: Never   Smokeless tobacco: Never  Vaping Use   Vaping Use: Never used  Substance and Sexual Activity   Alcohol use: Yes    Comment: occas.   Drug  use: No   Sexual activity: Not on file  Other Topics Concern   Not on file  Social History Narrative   Not on file   Social Determinants of Health   Financial Resource Strain: Low Risk  (11/07/2021)   Overall Financial Resource Strain (CARDIA)    Difficulty of Paying Living Expenses: Not hard at all  Food Insecurity: No Food Insecurity (11/07/2021)   Hunger Vital Sign    Worried About Running Out of Food in the Last Year: Never true    [place] in the Last Year: Never true  Transportation Needs: Unknown (11/07/2021)   PRAPARE - Hydrologist (Medical): No    Lack of Transportation (Non-Medical): Not on file  Physical Activity: Sufficiently Active (11/07/2021)   Exercise  Vital Sign    Days of Exercise per Week: 7 days    Minutes of Exercise per Session: 70 min  Recent Concern: Physical Activity - Insufficiently Active (11/07/2021)   Exercise Vital Sign    Days of Exercise per Week: 4 days    Minutes of Exercise per Session: 30 min  Stress: Not on file  Social Connections: Not on file     Family History: The patient's family history includes Colon cancer in his paternal grandmother; Hypertension in his mother; Hypertrophic cardiomyopathy in his mother; Kidney failure in his father.  ROS:   Please see the history of present illness.     All other systems reviewed and are negative.  EKGs/Labs/Other Studies Reviewed:    The following studies were reviewed today:  EKG:   03/22/21: Sinus bradycardia with 1st heart with LVH  Transthoracic Echocardiogram: Date: 04/19/20 Results:  1. Moderate hypertophy of the basal septum with otherwise mild concentric  LVH. Mild intracavitary gradient. Peak velocity 1.6 m/s. Peak gradient 10  mmHg. Velocity increases to 3.2 m/s and peak gradient increases to 41.2  mmHg. Left ventricular ejection  fraction, by estimation, is 70 to 75%. The left ventricle has hyperdynamic  function. The left ventricle has no regional wall motion abnormalities.  There is moderate left ventricular hypertrophy of the basal-septal  segment. Left ventricular diastolic  parameters are consistent with Grade I diastolic dysfunction (impaired  relaxation). The average left ventricular global longitudinal strain is  -20.4 %. The global longitudinal strain is normal.   2. Right ventricular systolic function is normal. The right ventricular  size is mildly enlarged. There is normal pulmonary artery systolic  pressure.   3. Left atrial size was severely dilated.   4. Right atrial size was severely dilated.   5. The mitral valve is normal in structure. Trivial mitral valve  regurgitation. No evidence of mitral stenosis.   6. The aortic valve is  tricuspid. There is mild calcification of the  aortic valve. There is mild thickening of the aortic valve. Aortic valve  regurgitation is not visualized. No aortic stenosis is present.   7. Aortic dilatation noted. There is mild dilatation of the ascending  aorta, measuring 37 mm.   8. The inferior vena cava is dilated in size with >50% respiratory  variability, suggesting right atrial pressure of 8 mmHg.    Recent Labs: 07/30/2021: Hemoglobin 15.8; Platelets 222  Recent Lipid Panel No results found for: "CHOL", "TRIG", "HDL", "CHOLHDL", "VLDL", "LDLCALC", "LDLDIRECT"        Physical Exam:    VS:  BP (!) 162/68   Pulse (!) 57   Ht '5\' 8"'$  (1.727 m)   Wt 212  lb 9.6 oz (96.4 kg)   SpO2 96%   BMI 32.33 kg/m     Wt Readings from Last 3 Encounters:  01/03/22 212 lb 9.6 oz (96.4 kg)  12/13/21 210 lb (95.3 kg)  12/11/21 210 lb 3.2 oz (95.3 kg)    Gen: no distress Neck: No JVD, no carotid bruit Cardiac: No Rubs or Gallops, systolic Murmur worst with standing and Valsalva, regular bradycardia, +2 radial pulses Respiratory: Clear to auscultation bilaterally, normal effort, normal  respiratory rate GI: Soft, nontender, non-distended  MS: +1 ankle edema L > R;  moves all extremities Integument: Skin feels warm Neuro:  At time of evaluation, alert and oriented to person/place/time/situation  Psych: Normal affect, patient feels well   ASSESSMENT:    1. Obstructive hypertrophic cardiomyopathy ([place])     PLAN:    Hypertrophic Cardiomyopathy vs Hypertensive phenocopy - prior peak gradient of 42 mm Hg; will recheck on changes medications - NYHA Class II - would not tolerate transition to verapamil from current amlodipine - we are at a stand still on controlling his blood pressure - we have discussed limited options for LVOT obstruction, including SRT, ASA, and CMIs - we will repeat his echo; if he has a significant LVOT obstruction we may do a trial of CMIs; he may have to come off  his CCB;  We would trial this to see if we could improve if LVOT gradient, sx and blood pressure. - to that affect I have started the medication financial check process - This may not be affective, if he cannot get renal denervation at f/u we will discuss other options - ~ 16 weeks with me   Time Spent Directly with Patient:   I have spent a total of 45 minutes with the patient reviewing notes, imaging, EKGs, labs and examining the patient as well as establishing an assessment and plan that was discussed personally with the patient.  > 50% of time was spent in direct patient care.       Medication Adjustments/Labs and Tests Ordered: Current medicines are reviewed at length with the patient today.  Concerns regarding medicines are outlined above.  Orders Placed This Encounter  Procedures   ECHOCARDIOGRAM LIMITED   No orders of the defined types were placed in this encounter.   Patient Instructions  Medication Instructions:  Your physician recommends that you continue on your current medications as directed. Please refer to the Current Medication list given to you today.  You may start a new medication mavacamten (Camzyos) 5 mg by mouth once daily  *If you need a refill on your cardiac medications before your next appointment, please call your pharmacy*   Lab Work: NONE If you have labs (blood work) drawn today and your tests are completely normal, you will receive your results only by: [place] (if you have MyChart) OR A paper copy in the mail If you have any lab test that is abnormal or we need to change your treatment, we will call you to review the results.   Testing/Procedures: Your physician has requested that you have an echocardiogram. Echocardiography is a painless test that uses sound waves to create images of your heart. It provides your doctor with information about the size and shape of your heart and how well your heart's chambers and valves are working.  This procedure takes approximately one hour. There are no restrictions for this procedure. Please do NOT wear cologne, perfume, aftershave, or lotions (deodorant is allowed). Please arrive 15 minutes  prior to your appointment time.    Follow-Up: At [facility], you and your health needs are our priority.  As part of our continuing mission to provide you with exceptional heart care, we have created designated Provider Care Teams.  These Care Teams include your primary Cardiologist (physician) and Advanced Practice Providers (APPs -  Physician Assistants and Nurse Practitioners) who all work together to provide you with the care you need, when you need it.    Your next appointment:   16 week(s)  The format for your next appointment:   In Person  Provider:   Rudean Haskell, MD    Important Information About Sugar         Signed, Werner Lean, MD  01/03/2022 12:37 PM    [place]

## 2022-01-06 ENCOUNTER — Telehealth: Payer: Self-pay | Admitting: Pharmacist

## 2022-01-06 NOTE — Telephone Encounter (Signed)
PA for Camzyos started. Waiting on questions Key: [id number] [employer] form submitted

## 2022-01-07 DIAGNOSIS — R001 Bradycardia, unspecified: Secondary | ICD-10-CM | POA: Diagnosis not present

## 2022-01-07 DIAGNOSIS — I421 Obstructive hypertrophic cardiomyopathy: Secondary | ICD-10-CM | POA: Diagnosis not present

## 2022-01-10 DIAGNOSIS — E785 Hyperlipidemia, unspecified: Secondary | ICD-10-CM | POA: Diagnosis not present

## 2022-01-13 NOTE — Telephone Encounter (Signed)
Ebony of [employer] is following up requesting a status update for [employer].

## 2022-01-14 ENCOUNTER — Telehealth: Payer: Self-pay | Admitting: Cardiovascular Disease

## 2022-01-14 NOTE — Telephone Encounter (Signed)
Patient is returning RN's call regarding his heart monitor results. He is requesting a detailed VM be left with the results if he is unable to answer when calling back. Please advise.

## 2022-01-14 NOTE — Telephone Encounter (Signed)
Patient given results heart monitor per Dr. Kennon Holter notation. Patient stated he will have echo on 11/6 and then may be placed on camzyo. Patient stated he does not have chest pain, no change in SOB, does get winded during tennis, has a little bit of dizziness. He will start to monitor daily weights and continue to keep a BP diary.

## 2022-01-15 NOTE — Telephone Encounter (Signed)
Spoke with Charlena Cross who states that they do not have prior auth for this patient in the system. She states that we can submit a verbal PA by calling [phone]

## 2022-01-16 NOTE — Telephone Encounter (Signed)
PA was submitted via [facility] but [facility] states they dont have it. They are faxing over form. I will fill out and re-fax. Spoke with Dr. Gasper Sells- we will hold off on PA until his repeat echo

## 2022-01-20 ENCOUNTER — Ambulatory Visit (HOSPITAL_COMMUNITY): Payer: PPO | Attending: Internal Medicine

## 2022-01-20 DIAGNOSIS — I421 Obstructive hypertrophic cardiomyopathy: Secondary | ICD-10-CM

## 2022-01-20 LAB — ECHOCARDIOGRAM LIMITED
AR max vel: 3.7 cm2
AV Area VTI: 3.95 cm2
AV Area mean vel: 3.66 cm2
AV Mean grad: 5 mmHg
AV Peak grad: 8.3 mmHg
Ao pk vel: 1.44 m/s
Area-P 1/2: 2.99 cm2
S' Lateral: 2.5 cm

## 2022-01-21 ENCOUNTER — Other Ambulatory Visit (HOSPITAL_COMMUNITY): Payer: Self-pay

## 2022-01-22 ENCOUNTER — Other Ambulatory Visit (HOSPITAL_COMMUNITY): Payer: Self-pay

## 2022-01-24 ENCOUNTER — Encounter: Payer: Self-pay | Admitting: Internal Medicine

## 2022-01-27 MED ORDER — MAVACAMTEN 5 MG PO CAPS: 5.0000 mg | ORAL_CAPSULE | Freq: Every day | ORAL | 0 refills | Status: DC

## 2022-01-27 NOTE — Telephone Encounter (Signed)
Placed an order for mavacamten (Camzyos) 5 mg PO QD to [facility] for free sample.

## 2022-01-27 NOTE — Telephone Encounter (Signed)
-----   Message from Werner Lean, MD sent at 01/26/2022  3:16 PM EST ----- Results: Post PVC beat 60 mm Hg, LVOT gradients rest in 30-40 mm Hg LVEF 70% Plan: Will Trial Mavacamten 5 mg on 02/10/22 if still having symptoms  Werner Lean, MD

## 2022-02-03 ENCOUNTER — Encounter (HOSPITAL_BASED_OUTPATIENT_CLINIC_OR_DEPARTMENT_OTHER): Payer: Self-pay

## 2022-02-04 ENCOUNTER — Other Ambulatory Visit (HOSPITAL_COMMUNITY): Payer: Self-pay

## 2022-02-05 ENCOUNTER — Encounter (HOSPITAL_BASED_OUTPATIENT_CLINIC_OR_DEPARTMENT_OTHER): Payer: Self-pay | Admitting: Cardiovascular Disease

## 2022-02-05 ENCOUNTER — Ambulatory Visit (HOSPITAL_BASED_OUTPATIENT_CLINIC_OR_DEPARTMENT_OTHER): Payer: PPO | Admitting: Cardiovascular Disease

## 2022-02-05 ENCOUNTER — Other Ambulatory Visit (HOSPITAL_COMMUNITY): Payer: Self-pay

## 2022-02-05 VITALS — BP 156/68 | HR 54 | Ht 68.0 in | Wt 213.1 lb

## 2022-02-05 DIAGNOSIS — I1A0 Resistant hypertension: Secondary | ICD-10-CM

## 2022-02-05 DIAGNOSIS — I421 Obstructive hypertrophic cardiomyopathy: Secondary | ICD-10-CM | POA: Diagnosis not present

## 2022-02-05 MED ORDER — HYDRALAZINE HCL 25 MG PO TABS
25.0000 mg | ORAL_TABLET | Freq: Three times a day (TID) | ORAL | 3 refills | Status: AC | PRN
  Filled 2022-02-05: qty 270, 90d supply, fill #0

## 2022-02-05 MED ORDER — SPIRONOLACTONE 25 MG PO TABS
25.0000 mg | ORAL_TABLET | Freq: Every day | ORAL | 1 refills | Status: DC
  Filled 2022-02-05: qty 90, 90d supply, fill #0
  Filled 2022-04-21: qty 90, 90d supply, fill #1

## 2022-02-05 NOTE — Assessment & Plan Note (Signed)
His blood pressure continues to be very uncontrolled.  Unfortunately, he did not qualify for the renal denervation study as his diastolic blood pressures were under 90 on ambulatory monitor.  However his systolics are still above 140.  Yesterday he had blood pressure as high as the 200s under stress.  Even under low stress times his blood pressures have been ranging from the 140s to 160s.  Blood pressure does seem to be running higher in the right arm than the left.  However it is still within 10 points and therefore not asymmetric.  We will keep him on the list for when we start doing renal denervation on a more regular basis.  For now, continue amlodipine, HCTZ, irbesartan, and metoprolol.  We will add back spironolactone 25 mg daily.  I will also give him hydralazine 25 mg to take on an as-needed basis if his blood pressure is over 160.  We will not focus on exercise until his blood pressure start to come down.  Continue working on diet and sodium restriction.

## 2022-02-05 NOTE — Progress Notes (Deleted)
[facility] Follow-up:    Date:  02/05/2022   ID:  Alan Spencer, DOB [birth year], MRN [id number]  PCP:  Alan Lopes, MD  Cardiologist:  Alan Burow, MD  Nephrologist:  Referring MD: Alan Lopes, MD   CC: Hypertension  History of Present Illness:    Alan Spencer is a 66 y.o. male with a hx of hypertrophic cardiomyopathy, hypertension, hyperparathyroidism, hyperlipidemia, and OSA on CPAP, here for follow-up. He was initially seen 11/07/2021 in the [facility]. He has struggled with resistant hypertension in the 160s-170s. He had renal artery dopplers 06/2021 that had normal blood flow. Alan Spencer referred him to the [facility]. He also had an echo 04/2021 suggestive of HCM. LVEF was 70-75% with moderate hypertrophy of the basal septum (1.45cm). He had a mild gradient with peak velocity of 1.6 m/s that increased to 3.2 m/s with valsalva. He was referred to Alan Spencer and had a cardiac MRI 08/2021 that was felt to be consistent with HCM. There was no LGE. He previously had a coronary CT 2/23 with minimal plaque in the RCA. His calcium score was 40 which was 41st percentile.   Alan Spencer reported having more energy off of carvedilol. It was thought he would be a very good candidate for renal denervation. We recommended enrollment in the [facility] trio trial.  For this, spironolactone was stopped. In the interim HCTZ was increased to 25 mg.  At follow-up his blood pressure was higher in the office.  He ultimately did not qualify for that study because his blood pressure was controlled on 24-hour ambulatory monitor.  In the interim he was started on mavacamten for HTN.  Past Medical History:  Diagnosis Date   Anxiety    Essential hypertension, benign    Excessive daytime sleepiness 07/03/2015   Family history of colon cancer    HNP (herniated nucleus pulposus), lumbar    L4-L5   Mixed hyperlipidemia    Obesity (BMI 30-39.9)  01/10/2018   Obstructive hypertrophic cardiomyopathy ([place]) 11/07/2021   Rectal bleeding    Resistant hypertension    Essential hypertension   Sleep apnea    uses CPAP    Past Surgical History:  Procedure Laterality Date   broken left elbow     NASAL SEPTUM SURGERY  2022   PARATHYROIDECTOMY N/A 12/27/2020   Procedure: NECK EXPLORATION WITH BIOPSY OF PARATHYROID GLAND x3;  Surgeon: Alan Gemma, MD;  Location: [facility];  Service: General;  Laterality: N/A;   TONSILLECTOMY      Current Medications: Current Meds  Medication Sig   amLODipine (NORVASC) 10 MG tablet Take 1 tablet by mouth once daily   Armodafinil 250 MG tablet Take 250 mg by mouth as needed.   Coenzyme Q10 (COQ10) 100 MG CAPS Take 100 mg by mouth daily.   fluticasone (FLONASE) 50 MCG/ACT nasal spray Place 1 spray into both nostrils daily as needed.   Ginkgo Biloba 500 MG CAPS Take 500 mg by mouth as needed.   glucosamine-chondroitin 500-400 MG tablet Take 1 tablet by mouth daily.   hydrALAZINE (APRESOLINE) 25 MG tablet Take 1 tablet (25 mg total) by mouth 3 (three) times daily as needed (for blood pressure above 160).   hydrochlorothiazide (HYDRODIURIL) 12.5 MG tablet Take 1 tablet (12.5 mg total) by mouth daily.   irbesartan-hydrochlorothiazide (AVALIDE) 300-12.5 MG tablet Take 1 tablet by mouth once a day   metoprolol (TOPROL XL) 200 MG 24 hr tablet Take 1 tablet by  mouth daily.   Multiple Vitamins-Minerals (MULTIVITAMIN WITH MINERALS) tablet Take 1 tablet by mouth daily.   omega-3 acid ethyl esters (LOVAZA) 1 g capsule Take 2 g by mouth 2 (two) times daily.   rosuvastatin (CRESTOR) 20 MG tablet TAKE ONE TABLET BY MOUTH ONCE DAILY.   spironolactone (ALDACTONE) 25 MG tablet Take 1 tablet (25 mg total) by mouth daily.     Allergies:   Patient has no known allergies.   Social History   Socioeconomic History   Marital status: Married    Spouse name: Not on file   Number of children: Not on file   Years of education:  Not on file   Highest education level: Not on file  Occupational History   Not on file  Tobacco Use   Smoking status: Never   Smokeless tobacco: Never  Vaping Use   Vaping Use: Never used  Substance and Sexual Activity   Alcohol use: Yes    Comment: occas.   Drug use: No   Sexual activity: Not on file  Other Topics Concern   Not on file  Social History Narrative   Not on file   Social Determinants of Health   Financial Resource Strain: Low Risk  (11/07/2021)   Overall Financial Resource Strain (CARDIA)    Difficulty of Paying Living Expenses: Not hard at all  Food Insecurity: No Food Insecurity (11/07/2021)   Hunger Vital Sign    Worried About Running Out of Food in the Last Year: Never true    [place] in the Last Year: Never true  Transportation Needs: Unknown (11/07/2021)   PRAPARE - Hydrologist (Medical): No    Lack of Transportation (Non-Medical): Not on file  Physical Activity: Sufficiently Active (11/07/2021)   Exercise Vital Sign    Days of Exercise per Week: 7 days    Minutes of Exercise per Session: 70 min  Recent Concern: Physical Activity - Insufficiently Active (11/07/2021)   Exercise Vital Sign    Days of Exercise per Week: 4 days    Minutes of Exercise per Session: 30 min  Stress: Not on file  Social Connections: Not on file     Family History: The patient's family history includes Colon cancer in his paternal grandmother; Hypertension in his mother; Hypertrophic cardiomyopathy in his mother; Kidney failure in his father.  ROS:   Please see the history of present illness.    All other systems reviewed and are negative.  EKGs/Labs/Other Studies Reviewed:    Cardiac MRI 2021/09/15: IMPRESSION: In absence of system disease leading to hypertrophy, no evidence of infiltrative disease and consistent with hypertrophic cardiomyopathy.   There is qualitatively turbulent LVOT flow often seen in hypertrophic  cardiomyopathy.   No evidence of scar.  Renal Artery Doppler 07/02/2021: Summary:  Largest Aortic Diameter: 1.9 cm     Renal:     Right: Normal size right kidney. Normal cortical thickness of right         kidney. No evidence of right renal artery stenosis. Cyst(s)         noted. RRV flow present. Abnormal right Resistive Index.         Avascular cystic structure in lower polen of the kidney,         measuring 2.7 x 2.0 x 3.3 cm.  Left:  Normal size of left kidney. Normal cortical thickness of the         left kidney. No evidence of left  renal artery stenosis. LRV         flow present. Abnormal left Resisitve Index.  Mesenteric:  Normal Celiac artery and Superior Mesenteric artery findings.     Patent IVC.  Cardiac CTA 04/26/2021: FINDINGS: Non-cardiac: See separate report from [facility]. No significant findings on limited lung and soft tissue windows.   Calcium score: Mild 2 vessel calcium noted   LM 0   LAD 0.305   LCX 0   RCA 39.5   Total: 39.8 which is 41 st percentile for age/sex   Coronary Arteries: Right dominant with no anomalies   LM: Normal   LAD: 1-24% calcified plaque in mid vessel   D1: Normal   D2: Normal   D3 Normal   Circumflex: Normal   OM1: Normal   OM2: Normal   OM3: Normal   RCA: 1-24% calcified plaque in proximal, mid and distal vessel   PDA: Normal   PLA: Normal   IMPRESSION: 1. Calcium score 39.8 which is 41 st percentile for age/sex   2.  Normal ascending thoracic aorta 3.6 cm   3.  CAD RADS 1 non obstructive CAD see description above  Echocardiogram 04/19/2021: IMPRESSIONS     1. Moderate hypertophy of the basal septum with otherwise mild concentric  LVH. Mild intracavitary gradient. Peak velocity 1.6 m/s. Peak gradient 10  mmHg. Velocity increases to 3.2 m/s and peak gradient increases to 41.2  mmHg. Left ventricular ejection  fraction, by estimation, is 70 to 75%. The left ventricle has hyperdynamic   function. The left ventricle has no regional wall motion abnormalities.  There is moderate left ventricular hypertrophy of the basal-septal  segment. Left ventricular diastolic  parameters are consistent with Grade I diastolic dysfunction (impaired  relaxation). The average left ventricular global longitudinal strain is  -20.4 %. The global longitudinal strain is normal.   2. Right ventricular systolic function is normal. The right ventricular  size is mildly enlarged. There is normal pulmonary artery systolic  pressure.   3. Left atrial size was severely dilated.   4. Right atrial size was severely dilated.   5. The mitral valve is normal in structure. Trivial mitral valve  regurgitation. No evidence of mitral stenosis.   6. The aortic valve is tricuspid. There is mild calcification of the  aortic valve. There is mild thickening of the aortic valve. Aortic valve  regurgitation is not visualized. No aortic stenosis is present.   7. Aortic dilatation noted. There is mild dilatation of the ascending  aorta, measuring 37 mm.   8. The inferior vena cava is dilated in size with >50% respiratory  variability, suggesting right atrial pressure of 8 mmHg.     EKG:  EKG is personally reviewed. 12/02/2021:  EKG was not ordered. 11/07/2021:  EKG was not ordered.  Recent Labs: 07/30/2021: Hemoglobin 15.8; Platelets 222   Recent Lipid Panel No results Spencer for: "CHOL", "TRIG", "HDL", "CHOLHDL", "VLDL", "LDLCALC", "LDLDIRECT"  Physical Exam:    VS:  BP (!) 156/68 (BP Location: Right Arm, Patient Position: Sitting, Cuff Size: Normal)   Pulse (!) 54   Ht '5\' 8"'$  (1.727 m)   Wt 213 lb 1.6 oz (96.7 kg)   SpO2 95%   BMI 32.40 kg/m  , BMI Body mass index is 32.4 kg/m. GENERAL:  Well appearing HEENT: Pupils equal round and reactive, fundi not visualized, oral mucosa unremarkable NECK:  No jugular venous distention, waveform within normal limits, carotid upstroke brisk and symmetric, no bruits, no  thyromegaly LUNGS:  Clear to auscultation bilaterally HEART:  RRR.  PMI not displaced or sustained,S1 and S2 within normal limits, no S3, no S4, no clicks, no rubs, III/VI systolic murmur at the LUSB ABD:  Flat, positive bowel sounds normal in frequency in pitch, no bruits, no rebound, no guarding, no midline pulsatile mass, no hepatomegaly, no splenomegaly EXT:  2 plus pulses throughout, no edema, no cyanosis no clubbing SKIN:  No rashes no nodules NEURO:  Cranial nerves II through XII grossly intact, motor grossly intact throughout PSYCH:  Cognitively intact, oriented to person place and time  ASSESSMENT/PLAN:    Resistant hypertension His blood pressure continues to be very uncontrolled.  Unfortunately, he did not qualify for the renal denervation study as his diastolic blood pressures were under 90 on ambulatory monitor.  However his systolics are still above 140.  Yesterday he had blood pressure as high as the 200s under stress.  Even under low stress times his blood pressures have been ranging from the 140s to 160s.  Blood pressure does seem to be running higher in the right arm than the left.  However it is still within 10 points and therefore not asymmetric.  We will keep him on the list for when we start doing renal denervation on a more regular basis.  For now, continue amlodipine, HCTZ, irbesartan, and metoprolol.  We will add back spironolactone 25 mg daily.  I will also give him hydralazine 25 mg to take on an as-needed basis if his blood pressure is over 160.  We will not focus on exercise until his blood pressure start to come down.  Continue working on diet and sodium restriction.  Obstructive hypertrophic cardiomyopathy ([place]) He is scheduled to start on mavacamten next week.  Continue follow-up with Dr. Rose Fillers.  Screening for Secondary Hypertension:     11/07/2021   11:53 AM  Causes  Drugs/Herbals Screened     - Comments not salt conscious, minimal EtOH, minimal caffeine   Renovascular HTN Screened     - Comments renal Dopplers negative  Sleep Apnea Screened     - Comments uses CPAP  Thyroid Disease Screened     - Comments will send TSH from PCP    Relevant Labs/Studies:    Latest Ref Rng & Units 12/28/2020    3:55 AM 12/21/2020    1:55 PM 09/15/2015    9:34 PM  Basic Labs  Sodium 135 - 145 mmol/L 139  142  140   Potassium 3.5 - 5.1 mmol/L 3.5  3.5  3.1   Creatinine 0.61 - 1.24 mg/dL 1.06  0.92  0.89           Latest Ref Rng & Units 07/30/2021   12:03 PM  Renin/Aldosterone   Aldosterone 0.0 - 30.0 ng/dL 6.7   Renin 0.167 - 5.380 ng/mL/hr 0.290   Aldos/Renin Ratio 0.0 - 30.0 23.1              07/02/2021   12:40 PM  Renovascular   Renal Artery [place] Completed Yes     Disposition:    FU with Jacilyn Sanpedro C. Oval Linsey, MD, [facility] in 2 months.   Medication Adjustments/Labs and Tests Ordered: Current medicines are reviewed at length with the patient today.  Concerns regarding medicines are outlined above.   No orders of the defined types were placed in this encounter.  Meds ordered this encounter  Medications   spironolactone (ALDACTONE) 25 MG tablet    Sig: Take 1 tablet (25 mg  total) by mouth daily.    Dispense:  90 tablet    Refill:  1   hydrALAZINE (APRESOLINE) 25 MG tablet    Sig: Take 1 tablet (25 mg total) by mouth 3 (three) times daily as needed (for blood pressure above 160).    Dispense:  270 tablet    Refill:  3   I,Mathew Stumpf,acting as a scribe for Skeet Latch, MD.,have documented all relevant documentation on the behalf of Skeet Latch, MD,as directed by  Skeet Latch, MD while in the presence of Skeet Latch, MD.  I, Baskin Oval Linsey, MD have reviewed all documentation for this visit.  The documentation of the exam, diagnosis, procedures, and orders on 02/05/2022 are all accurate and complete.  Signed, Skeet Latch, MD  02/05/2022 4:34 PM    [place]

## 2022-02-05 NOTE — Assessment & Plan Note (Signed)
He is scheduled to start on mavacamten next week.  Continue follow-up with Dr. Rose Fillers.

## 2022-02-05 NOTE — Progress Notes (Signed)
[facility] Follow-up:    Date:  02/05/2022   ID:  Alan Spencer, DOB [birth year], MRN [id number]  PCP:  Donnajean Lopes, MD  Cardiologist:  Quay Burow, MD  Nephrologist:  Referring MD: Donnajean Lopes, MD   CC: Hypertension  History of Present Illness:    Alan Spencer is a 66 y.o. male with a hx of hypertrophic cardiomyopathy, hypertension, hyperparathyroidism, hyperlipidemia, and OSA on CPAP, here for follow-up. He was initially seen 11/07/2021 in the [facility]. He has struggled with resistant hypertension in the 160s-170s. He had renal artery dopplers 06/2021 that had normal blood flow. Dr. Gwenlyn Found referred him to the [facility]. He also had an echo 04/2021 suggestive of HCM. LVEF was 70-75% with moderate hypertrophy of the basal septum (1.45cm). He had a mild gradient with peak velocity of 1.6 m/s that increased to 3.2 m/s with valsalva. He was referred to Dr. Jackqulyn Livings and had a cardiac MRI 08/2021 that was felt to be consistent with HCM. There was no LGE. He previously had a coronary CT 2/23 with minimal plaque in the RCA. His calcium score was 40 which was 41st percentile.   Alan Spencer reported having more energy off of carvedilol. It was thought he would be a very good candidate for renal denervation. We recommended enrollment in the [facility] trio trial.  For this, spironolactone was stopped. In the interim HCTZ was increased to 25 mg.  At follow-up his blood pressure was higher in the office.  He ultimately did not qualify for that study because his blood pressure was controlled on 24-hour ambulatory monitor.  In the interim he was started on mavacamten for HTN.  Today, the patient states he is feeling alright. He is concerned that his diastolic BP has been too low. His BP reading in clinic today is 157/71. Upon recheck, his BP is 148/64 on his left arm. On his right arm, his BP result is 156/68. However, he has also  experienced higher BP results such as 130Q systolic. His BP is monitored regularly at home, and he reports that his blood pressure has been "up and down".  He has been on clonidine in past, and experienced associated fatigue. Last night at work, he experienced an episode of severe dizziness after receiving a phone call. His BP was measured at 657 systolic. He took a cough drop, which improved his symptoms. Additionally, he reports some back pain near the region of his left shoulder blade, and he is concerned that it may be related to his chest. Symptoms have not changed by walking. He believes the pain may be musculoskeletal, since it occurs upon straining the muscle. Additionally he reports some mild edema, although unchanged and stable. Of note, he confirms the target start date for Camzyos is the 27th but he has not yet received this. He denies any palpitations, or shortness of breath. No headaches, syncope, orthopnea, or PND.    Past Medical History:  Diagnosis Date   Anxiety    Essential hypertension, benign    Excessive daytime sleepiness 07/03/2015   Family history of colon cancer    HNP (herniated nucleus pulposus), lumbar    L4-L5   Mixed hyperlipidemia    Obesity (BMI 30-39.9) 01/10/2018   Obstructive hypertrophic cardiomyopathy ([place]) 11/07/2021   Rectal bleeding    Resistant hypertension    Essential hypertension   Sleep apnea    uses CPAP    Past Surgical History:  Procedure Laterality Date  broken left elbow     NASAL SEPTUM SURGERY  2022   PARATHYROIDECTOMY N/A 12/27/2020   Procedure: NECK EXPLORATION WITH BIOPSY OF PARATHYROID GLAND x3;  Surgeon: Armandina Gemma, MD;  Location: [facility];  Service: General;  Laterality: N/A;   TONSILLECTOMY      Current Medications: Current Meds  Medication Sig   amLODipine (NORVASC) 10 MG tablet Take 1 tablet by mouth once daily   Armodafinil 250 MG tablet Take 250 mg by mouth as needed.   Coenzyme Q10 (COQ10) 100 MG CAPS Take 100 mg by  mouth daily.   fluticasone (FLONASE) 50 MCG/ACT nasal spray Place 1 spray into both nostrils daily as needed.   Ginkgo Biloba 500 MG CAPS Take 500 mg by mouth as needed.   glucosamine-chondroitin 500-400 MG tablet Take 1 tablet by mouth daily.   hydrALAZINE (APRESOLINE) 25 MG tablet Take 1 tablet (25 mg total) by mouth 3 (three) times daily as needed (for blood pressure above 160).   hydrochlorothiazide (HYDRODIURIL) 12.5 MG tablet Take 1 tablet (12.5 mg total) by mouth daily.   irbesartan-hydrochlorothiazide (AVALIDE) 300-12.5 MG tablet Take 1 tablet by mouth once a day   metoprolol (TOPROL XL) 200 MG 24 hr tablet Take 1 tablet by mouth daily.   Multiple Vitamins-Minerals (MULTIVITAMIN WITH MINERALS) tablet Take 1 tablet by mouth daily.   omega-3 acid ethyl esters (LOVAZA) 1 g capsule Take 2 g by mouth 2 (two) times daily.   rosuvastatin (CRESTOR) 20 MG tablet TAKE ONE TABLET BY MOUTH ONCE DAILY.   spironolactone (ALDACTONE) 25 MG tablet Take 1 tablet (25 mg total) by mouth daily.     Allergies:   Patient has no known allergies.   Social History   Socioeconomic History   Marital status: Married    Spouse name: Not on file   Number of children: Not on file   Years of education: Not on file   Highest education level: Not on file  Occupational History   Not on file  Tobacco Use   Smoking status: Never   Smokeless tobacco: Never  Vaping Use   Vaping Use: Never used  Substance and Sexual Activity   Alcohol use: Yes    Comment: occas.   Drug use: No   Sexual activity: Not on file  Other Topics Concern   Not on file  Social History Narrative   Not on file   Social Determinants of Health   Financial Resource Strain: Low Risk  (11/07/2021)   Overall Financial Resource Strain (CARDIA)    Difficulty of Paying Living Expenses: Not hard at all  Food Insecurity: No Food Insecurity (11/07/2021)   Hunger Vital Sign    Worried About Running Out of Food in the Last Year: Never true     [place] in the Last Year: Never true  Transportation Needs: Unknown (11/07/2021)   PRAPARE - Hydrologist (Medical): No    Lack of Transportation (Non-Medical): Not on file  Physical Activity: Sufficiently Active (11/07/2021)   Exercise Vital Sign    Days of Exercise per Week: 7 days    Minutes of Exercise per Session: 70 min  Recent Concern: Physical Activity - Insufficiently Active (11/07/2021)   Exercise Vital Sign    Days of Exercise per Week: 4 days    Minutes of Exercise per Session: 30 min  Stress: Not on file  Social Connections: Not on file     Family History: The patient's family history  includes Colon cancer in his paternal grandmother; Hypertension in his mother; Hypertrophic cardiomyopathy in his mother; Kidney failure in his father.  ROS:   Please see the history of present illness.    (+) fatigue (+) dizziness (+) mild LE edema (+) back/left shoulder pain All other systems reviewed and are negative.  EKGs/Labs/Other Studies Reviewed:    TEE 01/20/2022: IMPRESSIONS     1. Limited echo for HOCM   2. HOCM with 10 mmHg rest gradient- valsalva was not entirely accurate,  however, delta appears to be between 32 and 60 mmHg. Left ventricular  ejection fraction, by estimation, is 70 to 75%. Left ventricular ejection  fraction by 3D volume is 71 %. The  left ventricle has hyperdynamic function. The left ventricle has no  regional wall motion abnormalities. There is moderate asymmetric left  ventricular hypertrophy of the basal-septal segment and otherwise mild  concentric hypertrophy.   3. The average left ventricular global longitudinal strain is -22.0 %.  The global longitudinal strain is normal.   4. The aortic valve is tricuspid. Aortic valve regurgitation is not  visualized.   Comparison(s): Changes from prior study are noted. 04/19/2021: LVEF 70-75%,  peak gradient 10 mmHg - increased to 41.1 mmHg with valsalva.   LT Monitor  01/07/2022: Patch Wear Time:  13 days and 19 hours (2023-09-30T13:19:04-398 to 2023-10-14T09:06:05-398)   Patient had a min HR of 27 bpm, max HR of 129 bpm, and avg HR of 57 bpm. Predominant underlying rhythm was Sinus Rhythm. First Degree AV Block was present. 2 Supraventricular Tachycardia runs occurred, the run with the fastest interval lasting 4 beats  with a max rate of 129 bpm, the longest lasting 4 beats with an avg rate of 110 bpm. 33 Pauses occurred, the longest lasting 3.5 secs (17 bpm). Isolated SVEs were rare (<1.0%), SVE Couplets were rare (<1.0%), and SVE Triplets were rare (<1.0%). Isolated  VEs were rare (<1.0%, 199), and no VE Couplets or VE Triplets were present.   1: SR/SB (down to 20 BPM in AM hours)/ST 2.  Short runs of SVT 3: Pauses up to 3.5 seconds  Cardiac MRI 09/04/2021: FINDINGS: 1. Normal left ventricular size, with LVEDD 46 mm, and LVEDVi 46 mL/m2.   Normal left ventricular thickness, with intraventricular septal thickness of 15 mm, posterior wall thickness of 10 mm, though myocardial mass index normal for gender at 65 g/m2.   Normal left ventricular systolic function (LVEF =43%). There are no regional wall motion abnormalities. There is late peaking chordal systolic motion of the anterior mitral valve (chordal SAM).   Left ventricular parametric mapping notable for normal ECV signal and normal T2 signal.   There is no late gadolinium enhancement in the left ventricular myocardium. Six standard deviation reference used.   2. Normal right ventricular size with RVEDVI 87 mL/m2.   Normal right ventricular thickness.   Normal right ventricular systolic function (RVEF =32%). There are no regional wall motion abnormalities or aneurysms.   3.  Normal left and right atrial size.   4. Normal size of the aortic root, ascending aorta and pulmonary artery.   5. Valve assessment:   Aortic Valve: Tri-leaflet aortic valve. Regurgitant fraction 14%, mean  gradient of 1 mm Hg. Mild central aortic regurgitation.   Pulmonic Valve: Regurgitant fraction 2%, mean gradient < 1 mm Hg. Qualitatively, there is no significant regurgitation.   Tricuspid Valve: Regurgitant fraction 10%. Mild central tricuspid regurgitation   Mitral Valve: Regurgitant fraction 16%.  Mild mitral regurgitation.  6.  Normal pericardium.  No pericardial effusion.   7. Grossly, no extracardiac findings. Recommended dedicated study if concerned for non-cardiac pathology.   IMPRESSION: In absence of system disease leading to hypertrophy, no evidence of infiltrative disease and consistent with hypertrophic cardiomyopathy.   There is qualitatively turbulent LVOT flow often seen in hypertrophic cardiomyopathy.   No evidence of scar.  Renal Artery Doppler 07/02/2021: Summary:  Largest Aortic Diameter: 1.9 cm     Renal:     Right: Normal size right kidney. Normal cortical thickness of right         kidney. No evidence of right renal artery stenosis. Cyst(s)         noted. RRV flow present. Abnormal right Resistive Index.         Avascular cystic structure in lower polen of the kidney,         measuring 2.7 x 2.0 x 3.3 cm.  Left:  Normal size of left kidney. Normal cortical thickness of the         left kidney. No evidence of left renal artery stenosis. LRV         flow present. Abnormal left Resisitve Index.  Mesenteric:  Normal Celiac artery and Superior Mesenteric artery findings.     Patent IVC.  Cardiac CTA 04/26/2021: FINDINGS: Non-cardiac: See separate report from [facility]. No significant findings on limited lung and soft tissue windows.   Calcium score: Mild 2 vessel calcium noted   LM 0   LAD 0.305   LCX 0   RCA 39.5   Total: 39.8 which is 41 st percentile for age/sex   Coronary Arteries: Right dominant with no anomalies   LM: Normal   LAD: 1-24% calcified plaque in mid vessel   D1: Normal   D2: Normal   D3 Normal    Circumflex: Normal   OM1: Normal   OM2: Normal   OM3: Normal   RCA: 1-24% calcified plaque in proximal, mid and distal vessel   PDA: Normal   PLA: Normal   IMPRESSION: 1. Calcium score 39.8 which is 41 st percentile for age/sex   2.  Normal ascending thoracic aorta 3.6 cm   3.  CAD RADS 1 non obstructive CAD see description above  Echocardiogram 04/19/2021: IMPRESSIONS     1. Moderate hypertophy of the basal septum with otherwise mild concentric  LVH. Mild intracavitary gradient. Peak velocity 1.6 m/s. Peak gradient 10  mmHg. Velocity increases to 3.2 m/s and peak gradient increases to 41.2  mmHg. Left ventricular ejection  fraction, by estimation, is 70 to 75%. The left ventricle has hyperdynamic  function. The left ventricle has no regional wall motion abnormalities.  There is moderate left ventricular hypertrophy of the basal-septal  segment. Left ventricular diastolic  parameters are consistent with Grade I diastolic dysfunction (impaired  relaxation). The average left ventricular global longitudinal strain is  -20.4 %. The global longitudinal strain is normal.   2. Right ventricular systolic function is normal. The right ventricular  size is mildly enlarged. There is normal pulmonary artery systolic  pressure.   3. Left atrial size was severely dilated.   4. Right atrial size was severely dilated.   5. The mitral valve is normal in structure. Trivial mitral valve  regurgitation. No evidence of mitral stenosis.   6. The aortic valve is tricuspid. There is mild calcification of the  aortic valve. There is mild thickening of the aortic valve. Aortic valve  regurgitation is not visualized. No  aortic stenosis is present.   7. Aortic dilatation noted. There is mild dilatation of the ascending  aorta, measuring 37 mm.   8. The inferior vena cava is dilated in size with >50% respiratory  variability, suggesting right atrial pressure of 8 mmHg.     EKG:  EKG is  personally reviewed. 02/05/2022: EKG was not ordered.  12/02/2021:  EKG was not ordered. 11/07/2021:  EKG was not ordered.  Recent Labs: 07/30/2021: Hemoglobin 15.8; Platelets 222   Recent Lipid Panel No results found for: "CHOL", "TRIG", "HDL", "CHOLHDL", "VLDL", "LDLCALC", "LDLDIRECT"  Physical Exam:    VS:  BP (!) 156/68 (BP Location: Right Arm, Patient Position: Sitting, Cuff Size: Normal)   Pulse (!) 54   Ht '5\' 8"'$  (1.727 m)   Wt 213 lb 1.6 oz (96.7 kg)   SpO2 95%   BMI 32.40 kg/m  , BMI Body mass index is 32.4 kg/m. GENERAL:  Well appearing HEENT: Pupils equal round and reactive, fundi not visualized, oral mucosa unremarkable NECK:  No jugular venous distention, waveform within normal limits, carotid upstroke brisk and symmetric, no bruits, no thyromegaly LUNGS:  Clear to auscultation bilaterally HEART:  RRR.  PMI not displaced or sustained,S1 and S2 within normal limits, no S3, no S4, no clicks, no rubs, III/VI systolic murmur at the LUSB ABD:  Flat, positive bowel sounds normal in frequency in pitch, no bruits, no rebound, no guarding, no midline pulsatile mass, no hepatomegaly, no splenomegaly EXT:  2 plus pulses throughout, no edema, no cyanosis no clubbing SKIN:  No rashes no nodules NEURO:  Cranial nerves II through XII grossly intact, motor grossly intact throughout PSYCH:  Cognitively intact, oriented to person place and time  ASSESSMENT/PLAN:    Resistant hypertension His blood pressure continues to be very uncontrolled.  Unfortunately, he did not qualify for the renal denervation study as his diastolic blood pressures were under 90 on ambulatory monitor.  However his systolics are still above 140.  Yesterday he had blood pressure as high as the 200s under stress.  Even under low stress times his blood pressures have been ranging from the 140s to 160s.  Blood pressure does seem to be running higher in the right arm than the left.  However it is still within 10 points and  therefore not asymmetric.  We will keep him on the list for when we start doing renal denervation on a more regular basis.  For now, continue amlodipine, HCTZ, irbesartan, and metoprolol.  We will add back spironolactone 25 mg daily.  I will also give him hydralazine 25 mg to take on an as-needed basis if his blood pressure is over 160.  We will not focus on exercise until his blood pressure start to come down.  Continue working on diet and sodium restriction.  Obstructive hypertrophic cardiomyopathy ([place]) He is scheduled to start on mavacamten next week.  Continue follow-up with Dr. Rose Fillers.  Screening for Secondary Hypertension:     11/07/2021   11:53 AM  Causes  Drugs/Herbals Screened     - Comments not salt conscious, minimal EtOH, minimal caffeine  Renovascular HTN Screened     - Comments renal Dopplers negative  Sleep Apnea Screened     - Comments uses CPAP  Thyroid Disease Screened     - Comments will send TSH from PCP    Relevant Labs/Studies:    Latest Ref Rng & Units 12/28/2020    3:55 AM 12/21/2020    1:55 PM 09/15/2015  9:34 PM  Basic Labs  Sodium 135 - 145 mmol/L 139  142  140   Potassium 3.5 - 5.1 mmol/L 3.5  3.5  3.1   Creatinine 0.61 - 1.24 mg/dL 1.06  0.92  0.89           Latest Ref Rng & Units 07/30/2021   12:03 PM  Renin/Aldosterone   Aldosterone 0.0 - 30.0 ng/dL 6.7   Renin 0.167 - 5.380 ng/mL/hr 0.290   Aldos/Renin Ratio 0.0 - 30.0 23.1              07/02/2021   12:40 PM  Renovascular   Renal Artery [place] Completed Yes     Disposition:    FU with Jakota Manthei C. Oval Linsey, MD, [facility] in 1 month.   Medication Adjustments/Labs and Tests Ordered: Current medicines are reviewed at length with the patient today.  Concerns regarding medicines are outlined above.   No orders of the defined types were placed in this encounter.  Meds ordered this encounter  Medications   spironolactone (ALDACTONE) 25 MG tablet    Sig: Take 1 tablet (25 mg total) by mouth  daily.    Dispense:  90 tablet    Refill:  1   hydrALAZINE (APRESOLINE) 25 MG tablet    Sig: Take 1 tablet (25 mg total) by mouth 3 (three) times daily as needed (for blood pressure above 160).    Dispense:  270 tablet    Refill:  3    I,Mitra Faeizi,acting as a scribe for [place], MD.,have documented all relevant documentation on the behalf of Skeet Latch, MD,as directed by  Skeet Latch, MD while in the presence of Skeet Latch, MD.   I, [place] Oval Linsey, MD have reviewed all documentation for this visit.  The documentation of the exam, diagnosis, procedures, and orders on 02/05/2022 are all accurate and complete.  Signed, Skeet Latch, MD  02/05/2022 4:35 PM    [place]

## 2022-02-05 NOTE — Patient Instructions (Addendum)
Medication Instructions:  Your physician has recommended you make the following change in your medication:   Resume Spironolactone '25mg'$  daily   Start: Hydralazine 3 times per day as needed for Systolic blood pressures greater than 160   Follow-Up: 03/06/2022 11:20 am with Overton Mam NP, mychart visit

## 2022-02-19 ENCOUNTER — Other Ambulatory Visit: Payer: Self-pay | Admitting: Internal Medicine

## 2022-02-24 ENCOUNTER — Telehealth: Payer: Self-pay | Admitting: Pharmacist

## 2022-02-24 NOTE — Telephone Encounter (Signed)
Received fax that pt's Camzyos copay will cost $104/fill and his specialty pharmacy to dispense the med will be [place], [facility] [id number].

## 2022-02-26 ENCOUNTER — Telehealth: Payer: Self-pay | Admitting: Internal Medicine

## 2022-02-26 DIAGNOSIS — I421 Obstructive hypertrophic cardiomyopathy: Secondary | ICD-10-CM

## 2022-02-26 NOTE — Telephone Encounter (Signed)
Called Patient  His medication was not delivered today. They will try to deliver it tomorrow or Friday  I have sent an email to the [facility] team.  Please move start date, and 06/23/10 week echos to 03/03/22.     Rudean Haskell, MD [address] [place],  [id number] [phone]  6:04 PM

## 2022-02-28 ENCOUNTER — Encounter (HOSPITAL_COMMUNITY): Payer: Self-pay

## 2022-02-28 NOTE — Addendum Note (Signed)
Addended by: Aris Georgia, Matheau Orona L on: 02/28/2022 12:52 PM   Modules accepted: Orders

## 2022-02-28 NOTE — Telephone Encounter (Signed)
Placed order for echos

## 2022-03-01 ENCOUNTER — Encounter: Payer: Self-pay | Admitting: Internal Medicine

## 2022-03-06 ENCOUNTER — Encounter (HOSPITAL_BASED_OUTPATIENT_CLINIC_OR_DEPARTMENT_OTHER): Payer: Self-pay | Admitting: Family

## 2022-03-06 ENCOUNTER — Telehealth (INDEPENDENT_AMBULATORY_CARE_PROVIDER_SITE_OTHER): Payer: PPO | Admitting: Family

## 2022-03-06 VITALS — BP 120/48 | HR 56 | Ht 68.0 in | Wt 206.0 lb

## 2022-03-06 DIAGNOSIS — I1A0 Resistant hypertension: Secondary | ICD-10-CM

## 2022-03-06 DIAGNOSIS — I421 Obstructive hypertrophic cardiomyopathy: Secondary | ICD-10-CM

## 2022-03-06 NOTE — Progress Notes (Signed)
Virtual Visit via Video Note ([facility])   Because of Alan Spencer's co-morbid illnesses, he is at least at moderate risk for complications without adequate follow up.  This format is felt to be most appropriate for this patient at this time.  All issues noted in this document were discussed and addressed.  A limited physical exam was performed with this format.  Please refer to the patient's chart for his consent to telehealth for [facility].       Date:  03/06/2022   ID:  Alan Spencer, DOB Jun 29, 1955, MRN [id number] The patient was identified using 2 identifiers.  Patient Location: Other:  Workplace Provider Location: Office/Clinic   PCP:  Donnajean Lopes, [place] Providers Cardiologist:  Quay Burow, MD     Evaluation Performed:  Follow-Up Visit  Chief Complaint:  Hypertension follow up  History of Present Illness:    Alan Spencer is a 66 y.o. male with hypertrophic cardiomyopathy, HTN, CAD, hyperparathyroidism, HLD, OSA on CPAP.   Initially seen in [facility] 11/07/21. Renal dopplers 06/2021 normal bloodflow. Echo 04/2021 HCM EF 70-75%, moderate hypertrophy of basal septum, mild gradient with peak velocity 1.6 m/s increased to 3.2 m/s with valsalva. Cardiac MRI by Dr. Gasper Sells 08/2021 c/w HCM without LGE. Coronary CT 04/2021 minimal plaque in RCA and calcium score 40. He has been started on Mavacamten for HCM.  Last seen 02/05/22 and Spironolactone resumed. It had previously been held to determine candidacy for [facility] renal denervation trial.   Presents today for follow up. BP overall well controlled. He has needed PRN Hydralazine a few times for SBP >160 about 4 days ago. Notes some shortness of breath at rest but feels stronger when up walking on treadmill. No chest pain, pressure, tightness. He remains interested in renal denervation.   Past Medical History:  Diagnosis Date   Anxiety     Essential hypertension, benign    Excessive daytime sleepiness 07/03/2015   Family history of colon cancer    HNP (herniated nucleus pulposus), lumbar    L4-L5   Mixed hyperlipidemia    Obesity (BMI 30-39.9) 01/10/2018   Obstructive hypertrophic cardiomyopathy ([place]) 11/07/2021   Rectal bleeding    Resistant hypertension    Essential hypertension   Sleep apnea    uses CPAP   Past Surgical History:  Procedure Laterality Date   broken left elbow     NASAL SEPTUM SURGERY  2022   PARATHYROIDECTOMY N/A 12/27/2020   Procedure: NECK EXPLORATION WITH BIOPSY OF PARATHYROID GLAND x3;  Surgeon: Armandina Gemma, MD;  Location: [facility];  Service: General;  Laterality: N/A;   TONSILLECTOMY       Current Meds  Medication Sig   amLODipine (NORVASC) 10 MG tablet Take 1 tablet by mouth once daily   Armodafinil 250 MG tablet Take 250 mg by mouth as needed.   Coenzyme Q10 (COQ10) 100 MG CAPS Take 100 mg by mouth daily.   fluticasone (FLONASE) 50 MCG/ACT nasal spray Place 1 spray into both nostrils daily as needed.   Ginkgo Biloba 500 MG CAPS Take 500 mg by mouth as needed.   glucosamine-chondroitin 500-400 MG tablet Take 1 tablet by mouth daily.   hydrALAZINE (APRESOLINE) 25 MG tablet Take 1 tablet (25 mg total) by mouth 3 (three) times daily as needed (for blood pressure above 160).   hydrochlorothiazide (HYDRODIURIL) 12.5 MG tablet Take 1 tablet (12.5 mg total) by mouth daily.   irbesartan-hydrochlorothiazide (  AVALIDE) 300-12.5 MG tablet Take 1 tablet by mouth once a day   Mavacamten 5 MG CAPS Take 5 mg by mouth daily.   metoprolol (TOPROL XL) 200 MG 24 hr tablet Take 1 tablet by mouth daily.   Multiple Vitamins-Minerals (MULTIVITAMIN WITH MINERALS) tablet Take 1 tablet by mouth daily.   omega-3 acid ethyl esters (LOVAZA) 1 g capsule Take 2 g by mouth 2 (two) times daily.   rosuvastatin (CRESTOR) 20 MG tablet TAKE ONE TABLET BY MOUTH ONCE DAILY.   spironolactone (ALDACTONE) 25 MG tablet Take 1 tablet  (25 mg total) by mouth daily.     Allergies:   Patient has no known allergies.   Social History   Tobacco Use   Smoking status: Never   Smokeless tobacco: Never  Vaping Use   Vaping Use: Never used  Substance Use Topics   Alcohol use: Yes    Comment: occas.   Drug use: No     Family Hx: The patient's family history includes Colon cancer in his paternal grandmother; Hypertension in his mother; Hypertrophic cardiomyopathy in his mother; Kidney failure in his father.  ROS:   Please see the history of present illness.     All other systems reviewed and are negative.   Prior CV studies:   The following studies were reviewed today:  TEE 01/20/2022: IMPRESSIONS     1. Limited echo for HOCM   2. HOCM with 10 mmHg rest gradient- valsalva was not entirely accurate,  however, delta appears to be between 32 and 60 mmHg. Left ventricular  ejection fraction, by estimation, is 70 to 75%. Left ventricular ejection  fraction by 3D volume is 71 %. The  left ventricle has hyperdynamic function. The left ventricle has no  regional wall motion abnormalities. There is moderate asymmetric left  ventricular hypertrophy of the basal-septal segment and otherwise mild  concentric hypertrophy.   3. The average left ventricular global longitudinal strain is -22.0 %.  The global longitudinal strain is normal.   4. The aortic valve is tricuspid. Aortic valve regurgitation is not  visualized.   Comparison(s): Changes from prior study are noted. 04/19/2021: LVEF 70-75%,  peak gradient 10 mmHg - increased to 41.1 mmHg with valsalva.    LT Monitor 01/07/2022: Patch Wear Time:  13 days and 19 hours (2023-09-30T13:19:04-398 to 2023-10-14T09:06:05-398)   Patient had a min HR of 27 bpm, max HR of 129 bpm, and avg HR of 57 bpm. Predominant underlying rhythm was Sinus Rhythm. First Degree AV Block was present. 2 Supraventricular Tachycardia runs occurred, the run with the fastest interval lasting 4 beats   with a max rate of 129 bpm, the longest lasting 4 beats with an avg rate of 110 bpm. 33 Pauses occurred, the longest lasting 3.5 secs (17 bpm). Isolated SVEs were rare (<1.0%), SVE Couplets were rare (<1.0%), and SVE Triplets were rare (<1.0%). Isolated  VEs were rare (<1.0%, 199), and no VE Couplets or VE Triplets were present.   1: SR/SB (down to 20 BPM in AM hours)/ST 2.  Short runs of SVT 3: Pauses up to 3.5 seconds   Cardiac MRI 09/04/2021: FINDINGS: 1. Normal left ventricular size, with LVEDD 46 mm, and LVEDVi 46 mL/m2.   Normal left ventricular thickness, with intraventricular septal thickness of 15 mm, posterior wall thickness of 10 mm, though myocardial mass index normal for gender at 65 g/m2.   Normal left ventricular systolic function (LVEF =18%). There are no regional wall motion abnormalities. There is late  peaking chordal systolic motion of the anterior mitral valve (chordal SAM).   Left ventricular parametric mapping notable for normal ECV signal and normal T2 signal.   There is no late gadolinium enhancement in the left ventricular myocardium. Six standard deviation reference used.   2. Normal right ventricular size with RVEDVI 87 mL/m2.   Normal right ventricular thickness.   Normal right ventricular systolic function (RVEF =35%). There are no regional wall motion abnormalities or aneurysms.   3.  Normal left and right atrial size.   4. Normal size of the aortic root, ascending aorta and pulmonary artery.   5. Valve assessment:   Aortic Valve: Tri-leaflet aortic valve. Regurgitant fraction 14%, mean gradient of 1 mm Hg. Mild central aortic regurgitation.   Pulmonic Valve: Regurgitant fraction 2%, mean gradient < 1 mm Hg. Qualitatively, there is no significant regurgitation.   Tricuspid Valve: Regurgitant fraction 10%. Mild central tricuspid regurgitation   Mitral Valve: Regurgitant fraction 16%.  Mild mitral regurgitation.   6.  Normal pericardium.   No pericardial effusion.   7. Grossly, no extracardiac findings. Recommended dedicated study if concerned for non-cardiac pathology.   IMPRESSION: In absence of system disease leading to hypertrophy, no evidence of infiltrative disease and consistent with hypertrophic cardiomyopathy.   There is qualitatively turbulent LVOT flow often seen in hypertrophic cardiomyopathy.   No evidence of scar.   Renal Artery Doppler 07/02/2021: Summary:  Largest Aortic Diameter: 1.9 cm     Renal:     Right: Normal size right kidney. Normal cortical thickness of right         kidney. No evidence of right renal artery stenosis. Cyst(s)         noted. RRV flow present. Abnormal right Resistive Index.         Avascular cystic structure in lower polen of the kidney,         measuring 2.7 x 2.0 x 3.3 cm.  Left:  Normal size of left kidney. Normal cortical thickness of the         left kidney. No evidence of left renal artery stenosis. LRV         flow present. Abnormal left Resisitve Index.  Mesenteric:  Normal Celiac artery and Superior Mesenteric artery findings.     Patent IVC.   Cardiac CTA 04/26/2021: FINDINGS: Non-cardiac: See separate report from [facility]. No significant findings on limited lung and soft tissue windows.   Calcium score: Mild 2 vessel calcium noted   LM 0   LAD 0.305   LCX 0   RCA 39.5   Total: 39.8 which is 41 st percentile for age/sex   Coronary Arteries: Right dominant with no anomalies   LM: Normal   LAD: 1-24% calcified plaque in mid vessel   D1: Normal   D2: Normal   D3 Normal   Circumflex: Normal   OM1: Normal   OM2: Normal   OM3: Normal   RCA: 1-24% calcified plaque in proximal, mid and distal vessel   PDA: Normal   PLA: Normal   IMPRESSION: 1. Calcium score 39.8 which is 41 st percentile for age/sex   2.  Normal ascending thoracic aorta 3.6 cm   3.  CAD RADS 1 non obstructive CAD see description above    Echocardiogram 04/19/2021: IMPRESSIONS     1. Moderate hypertophy of the basal septum with otherwise mild concentric  LVH. Mild intracavitary gradient. Peak velocity 1.6 m/s. Peak gradient 10  mmHg. Velocity increases to 3.2 m/s  and peak gradient increases to 41.2  mmHg. Left ventricular ejection  fraction, by estimation, is 70 to 75%. The left ventricle has hyperdynamic  function. The left ventricle has no regional wall motion abnormalities.  There is moderate left ventricular hypertrophy of the basal-septal  segment. Left ventricular diastolic  parameters are consistent with Grade I diastolic dysfunction (impaired  relaxation). The average left ventricular global longitudinal strain is  -20.4 %. The global longitudinal strain is normal.   2. Right ventricular systolic function is normal. The right ventricular  size is mildly enlarged. There is normal pulmonary artery systolic  pressure.   3. Left atrial size was severely dilated.   4. Right atrial size was severely dilated.   5. The mitral valve is normal in structure. Trivial mitral valve  regurgitation. No evidence of mitral stenosis.   6. The aortic valve is tricuspid. There is mild calcification of the  aortic valve. There is mild thickening of the aortic valve. Aortic valve  regurgitation is not visualized. No aortic stenosis is present.   7. Aortic dilatation noted. There is mild dilatation of the ascending  aorta, measuring 37 mm.   8. The inferior vena cava is dilated in size with >50% respiratory  variability, suggesting right atrial pressure of 8 mmHg.   Labs/Other Tests and Data Reviewed:    EKG:  No ECG reviewed.  Recent Labs: 07/30/2021: Hemoglobin 15.8; Platelets 222   Recent Lipid Panel No results found for: "CHOL", "TRIG", "HDL", "CHOLHDL", "LDLCALC", "LDLDIRECT"  Wt Readings from Last 3 Encounters:  03/06/22 206 lb (93.4 kg)  02/05/22 213 lb 1.6 oz (96.7 kg)  01/03/22 212 lb 9.6 oz (96.4 kg)          Objective:    Vital Signs:  BP (!) 120/48   Pulse (!) 56   Ht '5\' 8"'$  (1.727 m)   Wt 206 lb (93.4 kg)   BMI 31.32 kg/m    VITAL SIGNS:  reviewed RESPIRATORY:  normal respiratory effort, symmetric expansion CARDIOVASCULAR:  no peripheral edema  ASSESSMENT & PLAN:    Resistant HTN - BP overall controlled. Continue Amlodipine, HCTZ, Irbesartan, Metoprolol, Spironolactone. Discussed to monitor BP at home at least 2 hours after medications and sitting for 5-10 minutes. Will reach out to Dr. Oval Linsey to discuss renal denervation.   Obstructive hypertrophic cardiomyopathy - Follows with Dr. Gasper Sells. On Mavacamten.        Time:   Today, I have spent 13 minutes with the patient with telehealth technology discussing the above problems.     Medication Adjustments/Labs and Tests Ordered: Current medicines are reviewed at length with the patient today.  Concerns regarding medicines are outlined above.   Tests Ordered: No orders of the defined types were placed in this encounter.   Medication Changes: No orders of the defined types were placed in this encounter.   Follow Up:  In Person  as scheduled with Dr. Oval Linsey  Signed, Loel Dubonnet, NP  03/06/2022 11:52 AM    [place]

## 2022-03-06 NOTE — Patient Instructions (Signed)
Medication Instructions:  Your Physician recommend you continue on your current medication as directed.    Follow-Up: Follow up as scheduled with Dr. Fara Boros will reach out to Dr. Oval Linsey about renal denervation and follow up with you!

## 2022-03-09 ENCOUNTER — Encounter (HOSPITAL_BASED_OUTPATIENT_CLINIC_OR_DEPARTMENT_OTHER): Payer: Self-pay | Admitting: Family

## 2022-03-27 DIAGNOSIS — H40033 Anatomical narrow angle, bilateral: Secondary | ICD-10-CM | POA: Diagnosis not present

## 2022-03-27 DIAGNOSIS — H2513 Age-related nuclear cataract, bilateral: Secondary | ICD-10-CM | POA: Diagnosis not present

## 2022-03-31 ENCOUNTER — Ambulatory Visit (HOSPITAL_COMMUNITY): Payer: PPO | Attending: Cardiology

## 2022-03-31 DIAGNOSIS — I421 Obstructive hypertrophic cardiomyopathy: Secondary | ICD-10-CM | POA: Diagnosis not present

## 2022-03-31 LAB — ECHOCARDIOGRAM LIMITED
Area-P 1/2: 2.95 cm2
S' Lateral: 2.6 cm

## 2022-04-01 ENCOUNTER — Other Ambulatory Visit: Payer: Self-pay | Admitting: Internal Medicine

## 2022-04-01 MED ORDER — MAVACAMTEN 5 MG PO CAPS: 5.0000 mg | ORAL_CAPSULE | Freq: Every day | ORAL | 1 refills | Status: DC

## 2022-04-01 NOTE — Telephone Encounter (Signed)
The patient has been notified of the result and verbalized understanding.  All questions (if any) were answered. Shakeeta Godette N Madelaine Whipple, RN 04/01/2022 1:35 PM   Pt reports has not started any new medications OTC or prescribed.  No new symptoms to report. Prescription for Camzyos 5 mg sent to [facility] #35 1 refill. PSF updated on camzyos portal.

## 2022-04-01 NOTE — Telephone Encounter (Signed)
This is Dr. Berry's pt 

## 2022-04-01 NOTE — Telephone Encounter (Signed)
-----  Message from Werner Lean, MD sent at 03/31/2022  7:34 PM EST ----- Results: Peak LVOT Gradient: 38 mm Hg LVEF : 70% Plan: Mavacamten dose 5 mg  If Patient has new medications or supplements, will need medication review for potential interactions  8 week echo   Werner Lean, MD

## 2022-04-03 ENCOUNTER — Telehealth: Payer: Self-pay | Admitting: Internal Medicine

## 2022-04-03 ENCOUNTER — Encounter: Payer: Self-pay | Admitting: Internal Medicine

## 2022-04-03 NOTE — Telephone Encounter (Signed)
Called [facility] advised pt has a current prior auth on file that expires Jul 28, 2022.  Was on hold for 40 min hung up called back operator hung up called again.  Finally, able to speak with a representative.  Expressed pt co pay is $968.40.  Pt will need to call in to be prescreened for pt assistance.  My chart message sent to pt requesting call to [place] at [phone].

## 2022-04-03 NOTE — Telephone Encounter (Signed)
Pt c/o medication issue:  1. Name of Medication:   mavacamten 5 MG CAPS (Camzyos)  2. How are you currently taking this medication (dosage and times per day)? As prescribed  3. Are you having a reaction (difficulty breathing--STAT)?   4. What is your medication issue?   Caller stated they faxed forms to get prior authorization for this medication.  Key# [id number].  Caller stated patient is now completely out of this medication.

## 2022-04-04 ENCOUNTER — Encounter: Payer: Self-pay | Admitting: Internal Medicine

## 2022-04-09 ENCOUNTER — Telehealth: Payer: Self-pay | Admitting: Internal Medicine

## 2022-04-09 NOTE — Telephone Encounter (Signed)
Left VM discussing events of his price increase and potential reasons why. Discussed that [facility] will run a test script to see the potential cost.  Based on this information we will:  Place him on mavacamten See if he is a candidate for aficameten If not bring him back to discuss SRT.  Thanks, MAC

## 2022-04-12 ENCOUNTER — Encounter: Payer: Self-pay | Admitting: Internal Medicine

## 2022-04-18 ENCOUNTER — Other Ambulatory Visit (HOSPITAL_COMMUNITY): Payer: Self-pay

## 2022-04-18 DIAGNOSIS — G4733 Obstructive sleep apnea (adult) (pediatric): Secondary | ICD-10-CM | POA: Diagnosis not present

## 2022-04-18 NOTE — Telephone Encounter (Signed)
I emailed Ebony with [employer] again to ask about running a test claim since I hadn't heard follow up from her, still waiting on a response.  I am also going to try submitting a tier exception request for Camzyos to see if it helps lower his cost if approved.

## 2022-04-18 NOTE — Telephone Encounter (Addendum)
Tier exception was denied - prior authorization was approved but Camzyos is nonformulary, so looks like they approved it as a Tier 5 medication and pt has a 33% copay. I don't see his copay decreasing until he hits catastrophic coverage, but he needs to spend $8,000 out of pocket on medication costs before that happens:  Not sure that Camzyos will be a feasible option cost-wise. It sounds like [facility] reached out to him about pt assistance and he didn't qualify due to income but I'm not sure what their parameters are. I tried applying for [employer] for him as well and received messages "The patient has a grant restriction of type Failed Income Verification. This patient is not currently eligible due to the status of a previous audit." I reached out to the pharmacy techs and they can't run another test claim until 2/18 at the earliest since he just got rx filled. I hate that he already paid so much for his first fill. I'm still not sure why [employer] quoted [place] a copay of $104 when they did their benefit investigation in December.

## 2022-04-21 ENCOUNTER — Other Ambulatory Visit (HOSPITAL_COMMUNITY): Payer: Self-pay

## 2022-04-23 NOTE — Telephone Encounter (Signed)
I have spoke with pt and his insurance about Camzyos.   Short version: will be able to confirm how long he'll be in the donut hole on 2/18 when test claim can be re-run for next fill.  Long version: Called [facility] - Camzyos is non formulary, coverage request approved at Tier 4 pricing which is $100. However, single fill of Camzyos put pt in the donut hole where he is responsible for 25% copay. He paid $100 of Tier 4 pricing, then added $868 which was the 25% copay piece while being in the donut hole.  Pt will be responsible for 25% copay on subsequent fills until he is out of the donut hole, which occurs when pt's true out of pocket costs reach $8,000. He has already paid $3,400 of this and has $4,600 remaining. Manufacturer discount of [phone] was also applied and contributed to this amount. Asked if this applies for future fills as well, they cannot see on their end. They can estimate next fill may cost pt $1210 but can't see manufacturer discount until pharmacy runs claim. This can't be done until 2/18 at the earliest based on time of his last fill. Base price of med reported at [phone].  I will have pharmacy run test claim on 2/18 to see if manufacturer coupon is applied again - if so, pt should be out of donut hole after the next 1-2 fills. If coupon was a 1 time thing, he will be in the donut hole for a while longer.  Once he's out of the donut hole and enters catastrophic coverage phase, his insurance will pay the full cost of Camzyos and he will not pay anything through the rest of 2024.

## 2022-04-28 ENCOUNTER — Telehealth: Payer: Self-pay

## 2022-04-28 ENCOUNTER — Ambulatory Visit (HOSPITAL_COMMUNITY): Payer: PPO | Attending: Cardiology

## 2022-04-28 DIAGNOSIS — I421 Obstructive hypertrophic cardiomyopathy: Secondary | ICD-10-CM

## 2022-04-28 LAB — ECHOCARDIOGRAM LIMITED
Area-P 1/2: 3.74 cm2
S' Lateral: 1.8 cm

## 2022-04-28 MED ORDER — MAVACAMTEN 5 MG PO CAPS: 5.0000 mg | ORAL_CAPSULE | Freq: Every day | ORAL | 0 refills | Status: DC

## 2022-04-28 NOTE — Telephone Encounter (Signed)
-----   Message from Werner Lean, MD sent at 04/28/2022  2:58 PM EST ----- Results: Peak LVOT Gradient: 38 LVEF : 70 Plan: Mavacamten dose 5 mg If Patient has new medications or supplements, will need medication review for potential interactions   Werner Lean, MD

## 2022-04-28 NOTE — Telephone Encounter (Signed)
The patient has been notified of the result and verbalized understanding.  All questions (if any) were answered. Bedelia Person Denise Washburn, RN 04/28/2022 5:14 PM   Pt reports has not started any new medications OTC or prescribed.  Has more energy while playing tennis.  Expresses insurance will kick in once reaches $3000 OOP.  PSF updated on Camzyos portal refill still to specialty pharmacy.

## 2022-05-01 DIAGNOSIS — I10 Essential (primary) hypertension: Secondary | ICD-10-CM | POA: Diagnosis not present

## 2022-05-01 DIAGNOSIS — E785 Hyperlipidemia, unspecified: Secondary | ICD-10-CM | POA: Diagnosis not present

## 2022-05-03 ENCOUNTER — Other Ambulatory Visit: Payer: Self-pay | Admitting: Internal Medicine

## 2022-05-05 ENCOUNTER — Encounter: Payer: Self-pay | Admitting: Internal Medicine

## 2022-05-05 ENCOUNTER — Telehealth: Payer: Self-pay | Admitting: Pharmacist

## 2022-05-05 ENCOUNTER — Other Ambulatory Visit (HOSPITAL_COMMUNITY): Payer: Self-pay

## 2022-05-05 ENCOUNTER — Other Ambulatory Visit: Payer: Self-pay

## 2022-05-05 MED ORDER — AMLODIPINE BESYLATE 10 MG PO TABS
10.0000 mg | ORAL_TABLET | Freq: Every day | ORAL | 3 refills | Status: DC
  Filled 2022-05-05: qty 90, 90d supply, fill #0
  Filled 2022-07-30: qty 90, 90d supply, fill #1
  Filled 2022-10-29: qty 90, 90d supply, fill #2
  Filled 2022-12-13 – 2023-01-26 (×2): qty 90, 90d supply, fill #3

## 2022-05-05 NOTE — Telephone Encounter (Signed)
Had tech run test claim on next fill of Camzyos - med is billed at $10,332.28 they paid $9,131.73 leaving him with a $1200.55 copay. Could not see breakdown if manufacturer discount is being applied behind the scenes which affects when he'll be out of the donut hole.  Called [facility] pharmacy to see if they could provide this info. They stated they can't see that info but they did process the claim. Called his insurance to see if they can see the info now since a claim has been run, [phone].   They cannot see other discounts behind the scenes, but they did confirm $1200 copay for this fill. They were able to run a test claim on future fill which comes back at $0, they stated pt would be in catastrophic coverage at that time and would have no copay on rest of his fills for the year.   Essentially has 1 copay left on Camzyos of $1200 and should be free for the remainder of the year per his insurance. Spoke with pt who is aware of this and is ok with cost if this would be his last copay.

## 2022-05-05 NOTE — Progress Notes (Signed)
Called patient to discuss imaging.  Review different providers take on his L-R shunt. My clinic suspicion is that ASD closure has the ability to prevent [place] in the future.  Discussed TEE and Structural Eval. Consent for TEE.  He never received his Mavacamten after his last echo.  He is going out of town to [place] to visit his mothers and will be out of town for a week. Discuss his medication coverage.  Needs his medication sent to him Scheduling TEE with me; if not available Dr. Tobey Bride. Schedule structural eval after with Dr. Burt Knack.

## 2022-05-06 ENCOUNTER — Other Ambulatory Visit: Payer: Self-pay

## 2022-05-06 ENCOUNTER — Other Ambulatory Visit (HOSPITAL_COMMUNITY): Payer: Self-pay

## 2022-05-06 MED ORDER — MAVACAMTEN 5 MG PO CAPS: 5.0000 mg | ORAL_CAPSULE | Freq: Every day | ORAL | 0 refills | Status: DC

## 2022-05-06 NOTE — Telephone Encounter (Signed)
Called pt to determine best date for TEE pt would like to have testing performed on 06/13/22.  Advised pt I will call back to review instructions at a later time. Pt reports has not received shipment of Camzyos.  Advised pt I will call [place] to f/u.  PSF was updated on 04/28/22.    Called pharmacy was told the hold up is that pt needs an ICD Code for pt assistance.  I advised rep that no one has contacted our office for this information.  Rep reports a pharmacist called yesterday.  Was told pt co pay will be over $1200 for 1 month. Gave rep ICD code I42.1. Pharmacy will reach out to patient once review for pt assistance is made.

## 2022-05-06 NOTE — Telephone Encounter (Signed)
Pt is scheduled for TEE on 06/13/22 Procedure time is 10 am.  Case ID number [id number].   Called pt advised testing has been scheduled pt to report to [facility] at 9 am on 06/13/22.  Verbally reviewed TEE instructions also placed on my chart for pt to view.  Pt will have labs drawn at outside lab will fax results to our office.  Fax number [phone] given to pt.  Also advised pt that I reached out to pharmacy.  Pt reports pharmacy has already reached out to him.  Pt does not qualify for assistance.  All questions answered no further concerns at this time.

## 2022-05-09 ENCOUNTER — Telehealth (HOSPITAL_BASED_OUTPATIENT_CLINIC_OR_DEPARTMENT_OTHER): Payer: Self-pay

## 2022-05-09 NOTE — Telephone Encounter (Signed)
Number for [facility] regarding this encounter is [phone].  Georgana Curio MHA RN CCM

## 2022-05-09 NOTE — Telephone Encounter (Signed)
Placed call to patient to see if he has received his mavacamten.  Pt is visiting his mother in [place].  Pt states he has not received the mavacamten.  Pt verbalized a lot of concerns about side effects he has read about and the difficulty of obtaining the refills on this medication.  RN explained it is a regulatory requirement for drugs inserts to contain every single possible side effect. Dr. Glenford Bayley provided a phone number for patient to call for [facility], provide his current address and they will ship the [place] for receipt to him tomorrow at his mother's home.  Pt was happy to receive this information.  Georgana Curio MHA RN CCM

## 2022-05-10 ENCOUNTER — Other Ambulatory Visit: Payer: Self-pay | Admitting: Internal Medicine

## 2022-05-10 DIAGNOSIS — I421 Obstructive hypertrophic cardiomyopathy: Secondary | ICD-10-CM

## 2022-05-13 ENCOUNTER — Ambulatory Visit: Payer: PPO | Admitting: Internal Medicine

## 2022-05-17 DIAGNOSIS — G4733 Obstructive sleep apnea (adult) (pediatric): Secondary | ICD-10-CM | POA: Diagnosis not present

## 2022-05-22 DIAGNOSIS — D2272 Melanocytic nevi of left lower limb, including hip: Secondary | ICD-10-CM | POA: Diagnosis not present

## 2022-05-22 DIAGNOSIS — L603 Nail dystrophy: Secondary | ICD-10-CM | POA: Diagnosis not present

## 2022-05-22 DIAGNOSIS — L84 Corns and callosities: Secondary | ICD-10-CM | POA: Diagnosis not present

## 2022-05-22 DIAGNOSIS — L820 Inflamed seborrheic keratosis: Secondary | ICD-10-CM | POA: Diagnosis not present

## 2022-05-22 DIAGNOSIS — L570 Actinic keratosis: Secondary | ICD-10-CM | POA: Diagnosis not present

## 2022-05-22 DIAGNOSIS — L821 Other seborrheic keratosis: Secondary | ICD-10-CM | POA: Diagnosis not present

## 2022-05-22 DIAGNOSIS — D225 Melanocytic nevi of trunk: Secondary | ICD-10-CM | POA: Diagnosis not present

## 2022-05-26 ENCOUNTER — Telehealth: Payer: Self-pay

## 2022-05-26 ENCOUNTER — Ambulatory Visit (HOSPITAL_COMMUNITY): Payer: PPO | Attending: Cardiovascular Disease

## 2022-05-26 DIAGNOSIS — I421 Obstructive hypertrophic cardiomyopathy: Secondary | ICD-10-CM

## 2022-05-26 LAB — ECHOCARDIOGRAM LIMITED
Area-P 1/2: 3.53 cm2
S' Lateral: 2.4 cm

## 2022-05-26 MED ORDER — MAVACAMTEN 5 MG PO CAPS: 5.0000 mg | ORAL_CAPSULE | Freq: Every day | ORAL | 2 refills | Status: DC

## 2022-05-26 NOTE — H&P (View-Only) (Signed)
Cardiology Office Note:    Date:  06/03/2022   ID:  Alan Spencer, DOB [birth year], MRN [id number]  PCP:  Donnajean Lopes, MD   [facility] Providers Cardiologist:  Quay Burow, MD     Referring MD: Donnajean Lopes, MD   CC: HCM follow up ASD follow up  History of Present Illness:    Alan Spencer is a 67 y.o. male with a hx of HCM and uncontrolled HTN who presents for evaluation 07/30/21. 2023: Since last visit we have tried to down titrate medications that would affect his LVOT gradient.  No change on MRAs.  Unable to come off diuretics for BP.  Was not a candidate for the [place] trial (diastolic BP. 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    Patient notes no SOB at rest and no DOE. Is able to play tennis. Notes some fatigue; he has noted some very low heart rates and cannot tell if they are worsening his symptoms. Notes no palpitations Notes one episode this AM of hand cramps.  He is now back on a treadmill.  He has hand cramps this monring on the right hand that radiated to his chest, and then his neck.  Occurred at rest.  EKG done at his office without ST changes.   Past Medical History:  Diagnosis Date   Anxiety    Essential hypertension, benign    Excessive daytime sleepiness 07/03/2015   Family history of colon cancer    HNP (herniated nucleus pulposus), lumbar    L4-L5   Mixed hyperlipidemia    Obesity (BMI 30-39.9) 01/10/2018   Obstructive hypertrophic cardiomyopathy ([place]) 11/07/2021   Rectal bleeding    Resistant hypertension    Essential hypertension   Sleep apnea    uses CPAP    Past Surgical History:  Procedure Laterality Date   broken left elbow     NASAL SEPTUM SURGERY  2022   PARATHYROIDECTOMY N/A 12/27/2020   Procedure: NECK EXPLORATION WITH BIOPSY OF PARATHYROID GLAND x3;  Surgeon: Armandina Gemma, MD;  Location: [facility];  Service: General;  Laterality: N/A;    TONSILLECTOMY      Current Medications: Current Meds  Medication Sig   amLODipine (NORVASC) 10 MG tablet Take 1 tablet (10 mg total) by mouth daily.   Coenzyme Q10 (COQ10) 100 MG CAPS Take 100 mg by mouth daily.   fluticasone (FLONASE) 50 MCG/ACT nasal spray Place 1 spray into both nostrils daily as needed.   glucosamine-chondroitin 500-400 MG tablet Take 1 tablet by mouth daily.   hydrALAZINE (APRESOLINE) 25 MG tablet Take 1 tablet (25 mg total) by mouth 3 (three) times daily as needed (for blood pressure above 160).   hydrochlorothiazide (HYDRODIURIL) 12.5 MG tablet Take 1 tablet (12.5 mg total) by mouth daily.   irbesartan-hydrochlorothiazide (AVALIDE) 300-12.5 MG tablet Take 1 tablet by mouth once a day   mavacamten 5 MG CAPS Take 5 mg by mouth daily.   metoprolol succinate (TOPROL-XL) 100 MG 24 hr tablet Take 1 tablet (100 mg total) by mouth daily. Take with or immediately following a meal.   Multiple Vitamins-Minerals (MULTIVITAMIN WITH MINERALS) tablet Take 1 tablet by mouth daily.   omega-3 acid ethyl esters (LOVAZA) 1 g capsule Take 2 g by mouth 2 (two) times daily.   rosuvastatin (CRESTOR) 20 MG tablet TAKE ONE TABLET BY MOUTH ONCE DAILY.   spironolactone (ALDACTONE) 25 MG tablet Take 1 tablet (25 mg  total) by mouth daily.   [DISCONTINUED] metoprolol (TOPROL XL) 200 MG 24 hr tablet Take 1 tablet by mouth daily.     Allergies:   Patient has no known allergies.   Social History   Socioeconomic History   Marital status: Married    Spouse name: Not on file   Number of children: Not on file   Years of education: Not on file   Highest education level: Not on file  Occupational History   Not on file  Tobacco Use   Smoking status: Never   Smokeless tobacco: Never  Vaping Use   Vaping Use: Never used  Substance and Sexual Activity   Alcohol use: Yes    Comment: occas.   Drug use: No   Sexual activity: Not on file  Other Topics Concern   Not on file  Social History  Narrative   Not on file   Social Determinants of Health   Financial Resource Strain: Low Risk  (11/07/2021)   Overall Financial Resource Strain (CARDIA)    Difficulty of Paying Living Expenses: Not hard at all  Food Insecurity: No Food Insecurity (11/07/2021)   Hunger Vital Sign    Worried About Running Out of Food in the Last Year: Never true    [place] in the Last Year: Never true  Transportation Needs: Unknown (11/07/2021)   PRAPARE - Hydrologist (Medical): No    Lack of Transportation (Non-Medical): Not on file  Physical Activity: Sufficiently Active (11/07/2021)   Exercise Vital Sign    Days of Exercise per Week: 7 days    Minutes of Exercise per Session: 70 min  Recent Concern: Physical Activity - Insufficiently Active (11/07/2021)   Exercise Vital Sign    Days of Exercise per Week: 4 days    Minutes of Exercise per Session: 30 min  Stress: Not on file  Social Connections: Not on file     Family History: The patient's family history includes Colon cancer in his paternal grandmother; Hypertension in his mother; Hypertrophic cardiomyopathy in his mother; Kidney failure in his father.  ROS:   Please see the history of present illness.     All other systems reviewed and are negative.  EKGs/Labs/Other Studies Reviewed:    The following studies were reviewed today:  EKG:   03/22/21: Sinus bradycardia with 1st heart with LVH  Cardiac Studies & Procedures       ECHOCARDIOGRAM  ECHOCARDIOGRAM LIMITED 05/26/2022  Narrative ECHOCARDIOGRAM LIMITED REPORT    Patient Name:   Alan Spencer Date of Exam: 05/26/2022 Medical Rec #:  [id number]     Height:       68.0 in Accession #:    [id number]    Weight:       206.0 lb Date of Birth:  Jan 30, 1956    BSA:          2.070 m Patient Age:    10 years      BP:           149/78 mmHg Patient Gender: M             HR:           43 bpm. Exam Location:  [facility]  Procedure: 2D Echo, Limited  Echo and Limited Color Doppler  Indications:    I42.1 HOCM  History:        Patient has prior history of Echocardiogram examinations, most recent 04/28/2022. Risk Factors:Hypertension and Dyslipidemia.  Obstructive sleep apnea-CPAP.  Sonographer:    Cresenciano Lick RDCS Referring Phys: [id number] [facility] A Rella Egelston   Sonographer Comments: Supine -BP 149/78 Sitting- BP 145/79 IMPRESSIONS   1. No LVOT obstruction at current study; LVOT gradient 6 mm Hg. There is no SAM. Left ventricular ejection fraction, by estimation, is 65 to 70%. The left ventricle has normal function. There is severe asymmetric left ventricular hypertrophy. 2. Right ventricular systolic function is normal. The right ventricular size is moderately enlarged. 3. The mitral valve is normal in structure. Trivial mitral valve regurgitation.  Comparison(s): Prior images reviewed side by side. LVOT gradinet has resolved at rest and with Valsalva. RV is dilated, similar to prior. ASD, seen in prior, has not been well visualized in this study.  FINDINGS Left Ventricle: No LVOT obstruction at current study; LVOT gradient 6 mm Hg. There is no SAM. Left ventricular ejection fraction, by estimation, is 65 to 70%. The left ventricle has normal function. The left ventricular internal cavity size was normal in size. There is severe asymmetric left ventricular hypertrophy.  Right Ventricle: The right ventricular size is moderately enlarged. Right ventricular systolic function is normal.  Mitral Valve: The mitral valve is normal in structure. Trivial mitral valve regurgitation.  Tricuspid Valve: The tricuspid valve is normal in structure. Tricuspid valve regurgitation is mild . No evidence of tricuspid stenosis.  LEFT VENTRICLE PLAX 2D LVIDd:         4.40 cm Diastology LVIDs:         2.40 cm LV e' medial:    9.25 cm/s LV PW:         1.00 cm LV E/e' medial:  10.6 LV IVS:        1.00 cm LV e' lateral:   9.94 cm/s LV E/e'  lateral: 9.8   RIGHT VENTRICLE RV S prime:     14.50 cm/s  LEFT ATRIUM         Index LA diam:    5.10 cm 2.46 cm/m AORTIC VALVE LVOT Vmax:   112.50 cm/s LVOT Vmean:  78.550 cm/s LVOT VTI:    0.262 m  AORTA Ao Root diam: 3.50 cm  MITRAL VALVE               TRICUSPID VALVE MV Area (PHT): 3.53 cm    TR Peak grad:   27.2 mmHg MV Decel Time: 215 msec    TR Vmax:        261.00 cm/s MV E velocity: 97.90 cm/s MV A velocity: 75.60 cm/s  SHUNTS MV E/A ratio:  1.29        Systemic VTI: 0.26 m  Rudean Haskell MD Electronically signed by Rudean Haskell MD Signature Date/Time: 05/26/2022/3:34:37 PM    Final    MONITORS  LONG TERM MONITOR (3-14 DAYS) 01/08/2022  Narrative Patch Wear Time:  13 days and 19 hours (2023-09-30T13:19:04-398 to 2023-10-14T09:06:05-398)  Patient had a min HR of 27 bpm, max HR of 129 bpm, and avg HR of 57 bpm. Predominant underlying rhythm was Sinus Rhythm. First Degree AV Block was present. 2 Supraventricular Tachycardia runs occurred, the run with the fastest interval lasting 4 beats with a max rate of 129 bpm, the longest lasting 4 beats with an avg rate of 110 bpm. 33 Pauses occurred, the longest lasting 3.5 secs (17 bpm). Isolated SVEs were rare (<1.0%), SVE Couplets were rare (<1.0%), and SVE Triplets were rare (<1.0%). Isolated VEs were rare (<1.0%, 199), and no VE Couplets or VE Triplets were present.  1: SR/SB (down to 20 BPM in AM hours)/ST 2.  Short runs of SVT 3: Pauses up to 3.5 seconds   CT SCANS  CT CORONARY MORPH W/CTA COR W/SCORE 04/26/2021  Addendum 04/26/2021 12:53 PM ADDENDUM REPORT: 04/26/2021 12:51  CLINICAL DATA:  Dyspnea with coronary risk factors  EXAM: Cardiac CTA  MEDICATIONS: Sub lingual nitro. 4 mg and lopressor 100mg   TECHNIQUE: The patient was scanned on a [employer] 192 scanner. Gantry rotation speed was 250 msecs. Collimation was. 6 mm . A 120 kV prospective scan was triggered in the ascending  thoracic aorta at 140 HU's with full mA between 30-70% of the R-R interval . Average HR during the scan was 45 bpm. The 3D data set was interpreted on a dedicated work station using MPR, MIP and VRT modes. A total of 80 cc of contrast was used.  FINDINGS: Non-cardiac: See separate report from [facility]. No significant findings on limited lung and soft tissue windows.  Calcium score: Mild 2 vessel calcium noted  LM 0  LAD 0.305  LCX 0  RCA 39.5  Total: 39.8 which is 41 st percentile for age/sex  Coronary Arteries: Right dominant with no anomalies  LM: Normal  LAD: 1-24% calcified plaque in mid vessel  D1: Normal  D2: Normal  D3 Normal  Circumflex: Normal  OM1: Normal  OM2: Normal  OM3: Normal  RCA: 1-24% calcified plaque in proximal, mid and distal vessel  PDA: Normal  PLA: Normal  IMPRESSION: 1. Calcium score 39.8 which is 41 st percentile for age/sex  2.  Normal ascending thoracic aorta 3.6 cm  3.  CAD RADS 1 non obstructive CAD see description above  Jenkins Rouge   Electronically Signed By: Jenkins Rouge M.D. On: 04/26/2021 12:51  Narrative EXAM: OVER-READ INTERPRETATION  CT CHEST  The following report is an over-read performed by radiologist Dr. Vinnie Langton of [facility], [place] on 04/26/2021. This over-read does not include interpretation of cardiac or coronary anatomy or pathology. The coronary calcium score/coronary CTA interpretation by the cardiologist is attached.  COMPARISON:  No priors.  FINDINGS: Atherosclerotic calcifications in the thoracic aorta. Within the visualized portions of the thorax there are no suspicious appearing pulmonary nodules or masses, there is no acute consolidative airspace disease, no pleural effusions, no pneumothorax and no lymphadenopathy. Visualized portions of the upper abdomen are unremarkable. There are no aggressive appearing lytic or blastic lesions noted in the visualized  portions of the skeleton.  IMPRESSION: 1.  Aortic Atherosclerosis (ICD10-I70.0).  Electronically Signed: By: Vinnie Langton M.D. On: 04/26/2021 12:04   CARDIAC MRI  MR CARDIAC MORPHOLOGY W WO CONTRAST 09/05/2021  Narrative CLINICAL DATA:  Clinical question of hypertrophic cardiomyopathy Study assumes HCT of 45 and BSA of 2.12 m2.  EXAM: CARDIAC MRI  TECHNIQUE: The patient was scanned on a 1.5 Tesla GE magnet. A dedicated cardiac coil was used. Functional imaging was done using [employer] sequences. 2,3, and 4 chamber views were done to assess for RWMA's. Modified Simpson's rule using a short axis stack was used to calculate an ejection fraction on a dedicated work Conservation officer, nature. The patient received 10 cc of Gadavist. After 10 minutes inversion recovery sequences were used to assess for infiltration and scar tissue.  CONTRAST:  10 cc  of Gadavist  FINDINGS: 1. Normal left ventricular size, with LVEDD 46 mm, and LVEDVi 46 mL/m2.  Normal left ventricular thickness, with intraventricular septal thickness of 15 mm, posterior wall thickness of 10 mm,  though myocardial mass index normal for gender at 65 g/m2.  Normal left ventricular systolic function (LVEF [id number]). There are no regional wall motion abnormalities. There is late peaking chordal systolic motion of the anterior mitral valve (chordal SAM).  Left ventricular parametric mapping notable for normal ECV signal and normal T2 signal.  There is no late gadolinium enhancement in the left ventricular myocardium. Six standard deviation reference used.  2. Normal right ventricular size with RVEDVI 87 mL/m2.  Normal right ventricular thickness.  Normal right ventricular systolic function (RVEF [id number]). There are no regional wall motion abnormalities or aneurysms.  3.  Normal left and right atrial size.  4. Normal size of the aortic root, ascending aorta and pulmonary artery.  5. Valve  assessment:  Aortic Valve: Tri-leaflet aortic valve. Regurgitant fraction 14%, mean gradient of 1 mm Hg. Mild central aortic regurgitation.  Pulmonic Valve: Regurgitant fraction 2%, mean gradient < 1 mm Hg. Qualitatively, there is no significant regurgitation.  Tricuspid Valve: Regurgitant fraction 10%. Mild central tricuspid regurgitation  Mitral Valve: Regurgitant fraction 16%.  Mild mitral regurgitation.  6.  Normal pericardium.  No pericardial effusion.  7. Grossly, no extracardiac findings. Recommended dedicated study if concerned for non-cardiac pathology.  IMPRESSION: In absence of system disease leading to hypertrophy, no evidence of infiltrative disease and consistent with hypertrophic cardiomyopathy.  There is qualitatively turbulent LVOT flow often seen in hypertrophic cardiomyopathy.  No evidence of scar.  Rudean Haskell MD   Electronically Signed By: Rudean Haskell M.D. On: 09/05/2021 07:37           Recent Labs: 07/30/2021: Hemoglobin 15.8; Platelets 222  Recent Lipid Panel No results found for: "CHOL", "TRIG", "HDL", "CHOLHDL", "VLDL", "LDLCALC", "LDLDIRECT"        Physical Exam:    VS:  BP (!) 130/58   Pulse 67 Comment: left hand  Ht 5\' 8"  (1.727 m)   Wt 210 lb (95.3 kg)   SpO2 98%   BMI 31.93 kg/m     Wt Readings from Last 3 Encounters:  06/03/22 210 lb (95.3 kg)  05/30/22 211 lb 6.4 oz (95.9 kg)  03/06/22 206 lb (93.4 kg)    Gen: no distress Neck: No JVD Cardiac: No Rubs or Gallops, holosystolic murmur, regular bradycardia, +2 radial pulses Respiratory: Clear to auscultation bilaterally, normal effort, normal  respiratory rate GI: Soft, nontender, non-distended  MS: No Le edema;  moves all extremities Integument: Skin feels warm Neuro:  At time of evaluation, alert and oriented to person/place/time/situation  Psych: Normal affect, patient feels well   ASSESSMENT:    1. Obstructive hypertrophic cardiomyopathy  ([place])   2. ASD (atrial septal defect)   3. Mixed hyperlipidemia     PLAN:    Hypertrophic Cardiomyopathy vs Hypertensive phenocopy - LVOT gradient has resolved on mavacamten 5 mg - NYHA Class I-II - He has entered catastrophic coverage for his CMI, the donut hole is project to leave in 2025, we will plan to keep him on mavacamten 5 mg (his symptoms are likely multi component from HCM, HTN, and potentially from ASD) - we will try to decrease his Toprol XL to 100 mg given some of his fatigue; if he feels worse or BP goes up will return  HLD (TGS) - if cramps persist will back of statin, continue lovaza  ASD - with RV dilation and without dysfunction - reviewed echo with patient - consented for TEE 06/13/22; he is nervous given that he has seen a lot of TEEs  but he is amenable to procedure; his labs will be done at his office   Time Spent Directly with Patient:   I have spent a total of 40 minutes with the patient reviewing notes, imaging, EKGs, labs and examining the patient as well as establishing an assessment and plan that was discussed personally with the patient.  > 50% of time was spent in direct patient care and reviewing imaging with patient.        Medication Adjustments/Labs and Tests Ordered: Current medicines are reviewed at length with the patient today.  Concerns regarding medicines are outlined above.  No orders of the defined types were placed in this encounter.  Meds ordered this encounter  Medications   metoprolol succinate (TOPROL-XL) 100 MG 24 hr tablet    Sig: Take 1 tablet (100 mg total) by mouth daily. Take with or immediately following a meal.    Dispense:  90 tablet    Refill:  3    Patient Instructions  Medication Instructions:  Your physician has recommended you make the following change in your medication:  DECREASE: metoprolol (Toprol) to 100 mg by mouth once daily  *If you need a refill on your cardiac medications before your next appointment,  please call your pharmacy*   Lab Work: NONE  If you have labs (blood work) drawn today and your tests are completely normal, you will receive your results only by: [place] (if you have MyChart) OR A paper copy in the mail If you have any lab test that is abnormal or we need to change your treatment, we will call you to review the results.   Testing/Procedures: NONE   Follow-Up: At [facility], you and your health needs are our priority.  As part of our continuing mission to provide you with exceptional heart care, we have created designated Provider Care Teams.  These Care Teams include your primary Cardiologist (physician) and Advanced Practice Providers (APPs -  Physician Assistants and Nurse Practitioners) who all work together to provide you with the care you need, when you need it.  We recommend signing up for the patient portal called "MyChart".  Sign up information is provided on this After Visit Summary.  MyChart is used to connect with patients for Virtual Visits (Telemedicine).  Patients are able to view lab/test results, encounter notes, upcoming appointments, etc.  Non-urgent messages can be sent to your provider as well.   To learn more about what you can do with MyChart, go to [email or web address].    Your next appointment:   4 month(s)  Provider:   Rudean Haskell, MD        Signed, Werner Lean, MD  06/03/2022 12:53 PM    [place]

## 2022-05-26 NOTE — Telephone Encounter (Signed)
-----   Message from Werner Lean, MD sent at 05/26/2022  4:02 PM EDT ----- Results: LVEF 65-70% LVOT gradient has resolved Plan: Mavacamten 5 mg; move echos to 12 weeks; will see later in the month for ASD eval   Werner Lean, MD

## 2022-05-26 NOTE — Telephone Encounter (Signed)
The patient has been notified of the result and verbalized understanding.  All questions (if any) were answered. Bedelia Person Anup Brigham, RN 05/26/2022 5:03 PM   Pt denies HF symptoms and has not started any new medications.   Advised a scheduler will call to set up next Echo.  PSF updated on [facility] portal.  Refill camzyos sent into specialty pharmacy [id number] with 2 refills.

## 2022-05-26 NOTE — Progress Notes (Signed)
Cardiology Office Note:    Date:  05/26/2022   ID:  Alan Spencer, DOB 02-Dec-1955, MRN [id number]  PCP:  Donnajean Lopes, MD   [facility] Providers Cardiologist:  Quay Burow, MD     Referring MD: Donnajean Lopes, MD   CC: HCM follow up ASD follow up  History of Present Illness:    Alan Spencer is a 67 y.o. male with a hx of HCM and uncontrolled HTN who presents for evaluation 07/30/21. 2023: Since last visit we have tried to down titrate medications that would affect his LVOT gradient.  No change on MRAs.  Unable to come off diuretics for BP.  Was not a candidate for the Radiance trial (diastolic BP. 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    Patient notes *** SOB at rest and *** DOE Is able to *** on *** therapy. Notes *** fatigue. Notes *** palpitations Notes *** CP. Notes *** Dizziness. Notes *** syncope. Notable family events include ***.  Relevant testing includes ***.     Past Medical History:  Diagnosis Date   Anxiety    Essential hypertension, benign    Excessive daytime sleepiness 07/03/2015   Family history of colon cancer    HNP (herniated nucleus pulposus), lumbar    L4-L5   Mixed hyperlipidemia    Obesity (BMI 30-39.9) 01/10/2018   Obstructive hypertrophic cardiomyopathy ([place]) 11/07/2021   Rectal bleeding    Resistant hypertension    Essential hypertension   Sleep apnea    uses CPAP    Past Surgical History:  Procedure Laterality Date   broken left elbow     NASAL SEPTUM SURGERY  2022   PARATHYROIDECTOMY N/A 12/27/2020   Procedure: NECK EXPLORATION WITH BIOPSY OF PARATHYROID GLAND x3;  Surgeon: Armandina Gemma, MD;  Location: [facility];  Service: General;  Laterality: N/A;   TONSILLECTOMY      Current Medications: No outpatient medications have been marked as taking for the 06/03/22 encounter (Appointment) with Werner Lean, MD.     Allergies:   Patient  has no known allergies.   Social History   Socioeconomic History   Marital status: Married    Spouse name: Not on file   Number of children: Not on file   Years of education: Not on file   Highest education level: Not on file  Occupational History   Not on file  Tobacco Use   Smoking status: Never   Smokeless tobacco: Never  Vaping Use   Vaping Use: Never used  Substance and Sexual Activity   Alcohol use: Yes    Comment: occas.   Drug use: No   Sexual activity: Not on file  Other Topics Concern   Not on file  Social History Narrative   Not on file   Social Determinants of Health   Financial Resource Strain: Low Risk  (11/07/2021)   Overall Financial Resource Strain (CARDIA)    Difficulty of Paying Living Expenses: Not hard at all  Food Insecurity: No Food Insecurity (11/07/2021)   Hunger Vital Sign    Worried About Running Out of Food in the Last Year: Never true    Violet in the Last Year: Never true  Transportation Needs: Unknown (11/07/2021)   PRAPARE - Hydrologist (Medical): No    Lack of Transportation (Non-Medical): Not on file  Physical Activity: Sufficiently Active (11/07/2021)   Exercise  Vital Sign    Days of Exercise per Week: 7 days    Minutes of Exercise per Session: 70 min  Recent Concern: Physical Activity - Insufficiently Active (11/07/2021)   Exercise Vital Sign    Days of Exercise per Week: 4 days    Minutes of Exercise per Session: 30 min  Stress: Not on file  Social Connections: Not on file     Family History: The patient's family history includes Colon cancer in his paternal grandmother; Hypertension in his mother; Hypertrophic cardiomyopathy in his mother; Kidney failure in his father.  ROS:   Please see the history of present illness.     All other systems reviewed and are negative.  EKGs/Labs/Other Studies Reviewed:    The following studies were reviewed today:  EKG:   03/22/21: Sinus bradycardia  with 1st heart with LVH  Cardiac Studies & Procedures       ECHOCARDIOGRAM  ECHOCARDIOGRAM LIMITED 05/26/2022  Narrative ECHOCARDIOGRAM LIMITED REPORT    Patient Name:   Alan Spencer Date of Exam: 05/26/2022 Medical Rec #:  [id number]     Height:       68.0 in Accession #:    [id number]    Weight:       206.0 lb Date of Birth:  [birth year]    BSA:          2.070 m Patient Age:    76 years      BP:           149/78 mmHg Patient Gender: M             HR:           43 bpm. Exam Location:  [place]  Procedure: 2D Echo, Limited Echo and Limited Color Doppler  Indications:    I42.1 HOCM  History:        Patient has prior history of Echocardiogram examinations, most recent 04/28/2022. Risk Factors:Hypertension and Dyslipidemia. Obstructive sleep apnea-CPAP.  Sonographer:    Cresenciano Lick RDCS Referring Phys: [id number] [facility] A Roza Creamer   Sonographer Comments: Supine -BP 149/78 Sitting- BP 145/79 IMPRESSIONS   1. No LVOT obstruction at current study; LVOT gradient 6 mm Hg. There is no SAM. Left ventricular ejection fraction, by estimation, is 65 to 70%. The left ventricle has normal function. There is severe asymmetric left ventricular hypertrophy. 2. Right ventricular systolic function is normal. The right ventricular size is moderately enlarged. 3. The mitral valve is normal in structure. Trivial mitral valve regurgitation.  Comparison(s): Prior images reviewed side by side. LVOT gradinet has resolved at rest and with Valsalva. RV is dilated, similar to prior. ASD, seen in prior, has not been well visualized in this study.  FINDINGS Left Ventricle: No LVOT obstruction at current study; LVOT gradient 6 mm Hg. There is no SAM. Left ventricular ejection fraction, by estimation, is 65 to 70%. The left ventricle has normal function. The left ventricular internal cavity size was normal in size. There is severe asymmetric left ventricular hypertrophy.  Right  Ventricle: The right ventricular size is moderately enlarged. Right ventricular systolic function is normal.  Mitral Valve: The mitral valve is normal in structure. Trivial mitral valve regurgitation.  Tricuspid Valve: The tricuspid valve is normal in structure. Tricuspid valve regurgitation is mild . No evidence of tricuspid stenosis.  LEFT VENTRICLE PLAX 2D LVIDd:         4.40 cm Diastology LVIDs:         2.40 cm LV e' medial:  9.25 cm/s LV PW:         1.00 cm LV E/e' medial:  10.6 LV IVS:        1.00 cm LV e' lateral:   9.94 cm/s LV E/e' lateral: 9.8   RIGHT VENTRICLE RV S prime:     14.50 cm/s  LEFT ATRIUM         Index LA diam:    5.10 cm 2.46 cm/m AORTIC VALVE LVOT Vmax:   112.50 cm/s LVOT Vmean:  78.550 cm/s LVOT VTI:    0.262 m  AORTA Ao Root diam: 3.50 cm  MITRAL VALVE               TRICUSPID VALVE MV Area (PHT): 3.53 cm    TR Peak grad:   27.2 mmHg MV Decel Time: 215 msec    TR Vmax:        261.00 cm/s MV E velocity: 97.90 cm/s MV A velocity: 75.60 cm/s  SHUNTS MV E/A ratio:  1.29        Systemic VTI: 0.26 m  Rudean Haskell MD Electronically signed by Rudean Haskell MD Signature Date/Time: 05/26/2022/3:34:37 PM    Final    MONITORS  LONG TERM MONITOR (3-14 DAYS) 01/08/2022  Narrative Patch Wear Time:  13 days and 19 hours (2023-09-30T13:19:04-398 to 2023-10-14T09:06:05-398)  Patient had a min HR of 27 bpm, max HR of 129 bpm, and avg HR of 57 bpm. Predominant underlying rhythm was Sinus Rhythm. First Degree AV Block was present. 2 Supraventricular Tachycardia runs occurred, the run with the fastest interval lasting 4 beats with a max rate of 129 bpm, the longest lasting 4 beats with an avg rate of 110 bpm. 33 Pauses occurred, the longest lasting 3.5 secs (17 bpm). Isolated SVEs were rare (<1.0%), SVE Couplets were rare (<1.0%), and SVE Triplets were rare (<1.0%). Isolated VEs were rare (<1.0%, 199), and no VE Couplets or VE Triplets were  present.  1: SR/SB (down to 20 BPM in AM hours)/ST 2.  Short runs of SVT 3: Pauses up to 3.5 seconds   CT SCANS  CT CORONARY MORPH W/CTA COR W/SCORE 04/26/2021  Addendum 04/26/2021 12:53 PM ADDENDUM REPORT: 04/26/2021 12:51  CLINICAL DATA:  Dyspnea with coronary risk factors  EXAM: Cardiac CTA  MEDICATIONS: Sub lingual nitro. 4 mg and lopressor '100mg'$   TECHNIQUE: The patient was scanned on a [employer] 192 scanner. Gantry rotation speed was 250 msecs. Collimation was. 6 mm . A 120 kV prospective scan was triggered in the ascending thoracic aorta at 140 HU's with full mA between 30-70% of the R-R interval . Average HR during the scan was 45 bpm. The 3D data set was interpreted on a dedicated work station using MPR, MIP and VRT modes. A total of 80 cc of contrast was used.  FINDINGS: Non-cardiac: See separate report from [facility]. No significant findings on limited lung and soft tissue windows.  Calcium score: Mild 2 vessel calcium noted  LM 0  LAD 0.305  LCX 0  RCA 39.5  Total: 39.8 which is 41 st percentile for age/sex  Coronary Arteries: Right dominant with no anomalies  LM: Normal  LAD: 1-24% calcified plaque in mid vessel  D1: Normal  D2: Normal  D3 Normal  Circumflex: Normal  OM1: Normal  OM2: Normal  OM3: Normal  RCA: 1-24% calcified plaque in proximal, mid and distal vessel  PDA: Normal  PLA: Normal  IMPRESSION: 1. Calcium score 39.8 which is 41 st percentile for age/sex  2.  Normal ascending thoracic aorta 3.6 cm  3.  CAD RADS 1 non obstructive CAD see description above  Jenkins Rouge   Electronically Signed By: Jenkins Rouge M.D. On: 04/26/2021 12:51  Narrative EXAM: OVER-READ INTERPRETATION  CT CHEST  The following report is an over-read performed by radiologist Dr. Vinnie Langton of [facility], [place] on 04/26/2021. This over-read does not include interpretation of cardiac or coronary anatomy or  pathology. The coronary calcium score/coronary CTA interpretation by the cardiologist is attached.  COMPARISON:  No priors.  FINDINGS: Atherosclerotic calcifications in the thoracic aorta. Within the visualized portions of the thorax there are no suspicious appearing pulmonary nodules or masses, there is no acute consolidative airspace disease, no pleural effusions, no pneumothorax and no lymphadenopathy. Visualized portions of the upper abdomen are unremarkable. There are no aggressive appearing lytic or blastic lesions noted in the visualized portions of the skeleton.  IMPRESSION: 1.  Aortic Atherosclerosis (ICD10-I70.0).  Electronically Signed: By: Vinnie Langton M.D. On: 04/26/2021 12:04   CARDIAC MRI  MR CARDIAC MORPHOLOGY W WO CONTRAST 09/05/2021  Narrative CLINICAL DATA:  Clinical question of hypertrophic cardiomyopathy Study assumes HCT of 45 and BSA of 2.12 m2.  EXAM: CARDIAC MRI  TECHNIQUE: The patient was scanned on a 1.5 Tesla GE magnet. A dedicated cardiac coil was used. Functional imaging was done using Fiesta sequences. 2,3, and 4 chamber views were done to assess for RWMA's. Modified Simpson's rule using a short axis stack was used to calculate an ejection fraction on a dedicated work Conservation officer, nature. The patient received 10 cc of Gadavist. After 10 minutes inversion recovery sequences were used to assess for infiltration and scar tissue.  CONTRAST:  10 cc  of Gadavist  FINDINGS: 1. Normal left ventricular size, with LVEDD 46 mm, and LVEDVi 46 mL/m2.  Normal left ventricular thickness, with intraventricular septal thickness of 15 mm, posterior wall thickness of 10 mm, though myocardial mass index normal for gender at 65 g/m2.  Normal left ventricular systolic function (LVEF [id number]). There are no regional wall motion abnormalities. There is late peaking chordal systolic motion of the anterior mitral valve (chordal SAM).  Left  ventricular parametric mapping notable for normal ECV signal and normal T2 signal.  There is no late gadolinium enhancement in the left ventricular myocardium. Six standard deviation reference used.  2. Normal right ventricular size with RVEDVI 87 mL/m2.  Normal right ventricular thickness.  Normal right ventricular systolic function (RVEF [id number]). There are no regional wall motion abnormalities or aneurysms.  3.  Normal left and right atrial size.  4. Normal size of the aortic root, ascending aorta and pulmonary artery.  5. Valve assessment:  Aortic Valve: Tri-leaflet aortic valve. Regurgitant fraction 14%, mean gradient of 1 mm Hg. Mild central aortic regurgitation.  Pulmonic Valve: Regurgitant fraction 2%, mean gradient < 1 mm Hg. Qualitatively, there is no significant regurgitation.  Tricuspid Valve: Regurgitant fraction 10%. Mild central tricuspid regurgitation  Mitral Valve: Regurgitant fraction 16%.  Mild mitral regurgitation.  6.  Normal pericardium.  No pericardial effusion.  7. Grossly, no extracardiac findings. Recommended dedicated study if concerned for non-cardiac pathology.  IMPRESSION: In absence of system disease leading to hypertrophy, no evidence of infiltrative disease and consistent with hypertrophic cardiomyopathy.  There is qualitatively turbulent LVOT flow often seen in hypertrophic cardiomyopathy.  No evidence of scar.  Rudean Haskell MD   Electronically Signed By: Rudean Haskell M.D. On: 09/05/2021 07:37  Recent Labs: 07/30/2021: Hemoglobin 15.8; Platelets 222  Recent Lipid Panel No results found for: "CHOL", "TRIG", "HDL", "CHOLHDL", "VLDL", "LDLCALC", "LDLDIRECT"        Physical Exam:    VS:  There were no vitals taken for this visit.    Wt Readings from Last 3 Encounters:  03/06/22 206 lb (93.4 kg)  02/05/22 213 lb 1.6 oz (96.7 kg)  01/03/22 212 lb 9.6 oz (96.4 kg)    Gen: no distress Neck: No  JVD, no carotid bruit Cardiac: No Rubs or Gallops, systolic Murmur worst with standing and Valsalva, regular bradycardia, +2 radial pulses Respiratory: Clear to auscultation bilaterally, normal effort, normal  respiratory rate GI: Soft, nontender, non-distended  MS: +1 ankle edema L > R;  moves all extremities Integument: Skin feels warm Neuro:  At time of evaluation, alert and oriented to person/place/time/situation  Psych: Normal affect, patient feels well   ASSESSMENT:    No diagnosis found.   PLAN:    Hypertrophic Cardiomyopathy vs Hypertensive phenocopy - LVOT gradient *** - NYHA Class *** - ***  Financial  ASD - with RV dilation and without dysfunction ***   Time Spent Directly with Patient:   I have spent a total of ***with the patient reviewing notes, imaging, EKGs, labs and examining the patient as well as establishing an assessment and plan that was discussed personally with the patient.  > 50% of time was spent in direct patient care and/or *** family and reviewing imaging with patient ***.        Medication Adjustments/Labs and Tests Ordered: Current medicines are reviewed at length with the patient today.  Concerns regarding medicines are outlined above.  No orders of the defined types were placed in this encounter.  No orders of the defined types were placed in this encounter.   There are no Patient Instructions on file for this visit.   Signed, Werner Lean, MD  05/26/2022 4:14 PM    [place]

## 2022-05-27 ENCOUNTER — Encounter (HOSPITAL_COMMUNITY): Payer: Self-pay

## 2022-05-30 ENCOUNTER — Encounter: Payer: Self-pay | Admitting: Cardiovascular Disease

## 2022-05-30 ENCOUNTER — Ambulatory Visit: Payer: PPO | Attending: Cardiovascular Disease | Admitting: Cardiovascular Disease

## 2022-05-30 VITALS — BP 116/60 | HR 40 | Ht 68.0 in | Wt 211.4 lb

## 2022-05-30 DIAGNOSIS — I421 Obstructive hypertrophic cardiomyopathy: Secondary | ICD-10-CM

## 2022-05-30 DIAGNOSIS — R931 Abnormal findings on diagnostic imaging of heart and coronary circulation: Secondary | ICD-10-CM

## 2022-05-30 DIAGNOSIS — I1A0 Resistant hypertension: Secondary | ICD-10-CM | POA: Diagnosis not present

## 2022-05-30 DIAGNOSIS — E782 Mixed hyperlipidemia: Secondary | ICD-10-CM | POA: Diagnosis not present

## 2022-05-30 DIAGNOSIS — R001 Bradycardia, unspecified: Secondary | ICD-10-CM | POA: Diagnosis not present

## 2022-05-30 NOTE — Assessment & Plan Note (Signed)
History of hypertrophic cardiomyopathy followed by Dr.Chandrasakhar currently on mavacamten 5 mg a day with improvement in his outflow tract gradient.  He feels clinically improved.  His cardiac MRI was consistent with this.  He is scheduled for a transesophageal echo on 09/13/2022 looking for an ASD and then referral to structural heart after that.

## 2022-05-30 NOTE — Assessment & Plan Note (Signed)
Elevated coronary calcium score of 39 with nonobstructive CAD.  He denies chest pain.

## 2022-05-30 NOTE — Assessment & Plan Note (Signed)
History of resistant hypertension blood pressure measured today at 116/60.  Dr. Oval Linsey did add spironolactone which considerably improved his blood pressure.  He takes hydralazine as needed.  He is also on amlodipine, hydrochlorothiazide/irbesartan (Avalide) and high-dose metoprolol.  His resting heart rate is in the 40s.  His renal Doppler studies showed no evidence of renal artery stenosis.  He was being considered for renal artery denervation but apparently did not qualify.

## 2022-05-30 NOTE — Patient Instructions (Signed)
Medication Instructions:  Your physician recommends that you continue on your current medications as directed. Please refer to the Current Medication list given to you today.  *If you need a refill on your cardiac medications before your next appointment, please call your pharmacy*   Follow-Up: At [facility], you and your health needs are our priority.  As part of our continuing mission to provide you with exceptional heart care, we have created designated Provider Care Teams.  These Care Teams include your primary Cardiologist (physician) and Advanced Practice Providers (APPs -  Physician Assistants and Nurse Practitioners) who all work together to provide you with the care you need, when you need it.  We recommend signing up for the patient portal called "MyChart".  Sign up information is provided on this After Visit Summary.  MyChart is used to connect with patients for Virtual Visits (Telemedicine).  Patients are able to view lab/test results, encounter notes, upcoming appointments, etc.  Non-urgent messages can be sent to your provider as well.   To learn more about what you can do with MyChart, go to [email or web address].    Your next appointment:   6 month(s)  Provider:   Jonathan Berry, MD    

## 2022-05-30 NOTE — Progress Notes (Signed)
05/30/2022 Alan Spencer   15-Apr-1955  [id number]  Primary Physician Donnajean Lopes, MD Primary Cardiologist: Lorretta Harp MD Lupe Carney, [place]  HPI:  Alan Spencer is a 67 y.o.  mildly overweight married Caucasian male with no children who is a Environmental education officer at [employer].  He was referred by Dr. Inocencio Homes, his cardiologist, for cardiovascular evaluation because of dyspnea.  I last saw him in the office 06/14/2021.  His risk factors include treated hypertension hyperlipidemia.  There is no family history of heart disease.  Is never had a heart attack or stroke.  Denies chest pain but does get some dyspnea when exercising especially when playing tennis.  He does have sleep apnea on CPAP.  I obtained a 2D echo which initially showed severe basal septal hypertrophy with an outflow tract gradient of 3.2 m/s.  I referred him to Dr.Chandrasekhar for further evaluation of this.  He was placed on mavacamten 5 mg a day which resulted in a decrease in his gradient to 6 mmHg.  His blood pressure has been under better control on spironolactone.  His renal Dopplers were negative.  He was being considered for renal denervation but did not qualify for the study.  A coronary CTA revealed a coronary calcium score of 30 with nonobstructive CAD.  He is scheduled for TEE later this month.    Current Meds  Medication Sig   amLODipine (NORVASC) 10 MG tablet Take 1 tablet (10 mg total) by mouth daily.   Coenzyme Q10 (COQ10) 100 MG CAPS Take 100 mg by mouth daily.   fluticasone (FLONASE) 50 MCG/ACT nasal spray Place 1 spray into both nostrils daily as needed.   glucosamine-chondroitin 500-400 MG tablet Take 1 tablet by mouth daily.   hydrALAZINE (APRESOLINE) 25 MG tablet Take 1 tablet (25 mg total) by mouth 3 (three) times daily as needed (for blood pressure above 160).   hydrochlorothiazide (HYDRODIURIL) 12.5 MG tablet Take 1 tablet (12.5 mg total) by mouth daily.    irbesartan-hydrochlorothiazide (AVALIDE) 300-12.5 MG tablet Take 1 tablet by mouth once a day   mavacamten 5 MG CAPS Take 5 mg by mouth daily.   metoprolol (TOPROL XL) 200 MG 24 hr tablet Take 1 tablet by mouth daily.   Multiple Vitamins-Minerals (MULTIVITAMIN WITH MINERALS) tablet Take 1 tablet by mouth daily.   omega-3 acid ethyl esters (LOVAZA) 1 g capsule Take 2 g by mouth 2 (two) times daily.   rosuvastatin (CRESTOR) 20 MG tablet TAKE ONE TABLET BY MOUTH ONCE DAILY.   spironolactone (ALDACTONE) 25 MG tablet Take 1 tablet (25 mg total) by mouth daily.     No Known Allergies  Social History   Socioeconomic History   Marital status: Married    Spouse name: Not on file   Number of children: Not on file   Years of education: Not on file   Highest education level: Not on file  Occupational History   Not on file  Tobacco Use   Smoking status: Never   Smokeless tobacco: Never  Vaping Use   Vaping Use: Never used  Substance and Sexual Activity   Alcohol use: Yes    Comment: occas.   Drug use: No   Sexual activity: Not on file  Other Topics Concern   Not on file  Social History Narrative   Not on file   Social Determinants of Health   Financial Resource Strain: Low Risk  (11/07/2021)   Overall  Financial Resource Strain (CARDIA)    Difficulty of Paying Living Expenses: Not hard at all  Food Insecurity: No Food Insecurity (11/07/2021)   Hunger Vital Sign    Worried About Running Out of Food in the Last Year: Never true    Ran Out of Food in the Last Year: Never true  Transportation Needs: Unknown (11/07/2021)   PRAPARE - Hydrologist (Medical): No    Lack of Transportation (Non-Medical): Not on file  Physical Activity: Sufficiently Active (11/07/2021)   Exercise Vital Sign    Days of Exercise per Week: 7 days    Minutes of Exercise per Session: 70 min  Recent Concern: Physical Activity - Insufficiently Active (11/07/2021)   Exercise Vital Sign     Days of Exercise per Week: 4 days    Minutes of Exercise per Session: 30 min  Stress: Not on file  Social Connections: Not on file  Intimate Partner Violence: Not on file     Review of Systems: General: negative for chills, fever, night sweats or weight changes.  Cardiovascular: negative for chest pain, dyspnea on exertion, edema, orthopnea, palpitations, paroxysmal nocturnal dyspnea or shortness of breath Dermatological: negative for rash Respiratory: negative for cough or wheezing Urologic: negative for hematuria Abdominal: negative for nausea, vomiting, diarrhea, bright red blood per rectum, melena, or hematemesis Neurologic: negative for visual changes, syncope, or dizziness All other systems reviewed and are otherwise negative except as noted above.    Blood pressure 116/60, pulse (!) 40, height 5\' 8"  (1.727 m), weight 211 lb 6.4 oz (95.9 kg), SpO2 96 %.  General appearance: alert and no distress Neck: no adenopathy, no carotid bruit, no JVD, supple, symmetrical, trachea midline, and thyroid not enlarged, symmetric, no tenderness/mass/nodules Lungs: clear to auscultation bilaterally Heart: regular rate and rhythm, S1, S2 normal, no murmur, click, rub or gallop Extremities: extremities normal, atraumatic, no cyanosis or edema Pulses: 2+ and symmetric Skin: Skin color, texture, turgor normal. No rashes or lesions Neurologic: Grossly normal  EKG sinus bradycardia 40 with LVH.  I personally reviewed this EKG.  ASSESSMENT AND PLAN:   Resistant hypertension History of resistant hypertension blood pressure measured today at 116/60.  Dr. Oval Linsey did add spironolactone which considerably improved his blood pressure.  He takes hydralazine as needed.  He is also on amlodipine, hydrochlorothiazide/irbesartan (Avalide) and high-dose metoprolol.  His resting heart rate is in the 40s.  His renal Doppler studies showed no evidence of renal artery stenosis.  He was being considered for renal  artery denervation but apparently did not qualify.  Mixed hyperlipidemia History of hyperlipidemia on statin therapy followed by his PCP.  His most recent lipid profile performed 02/26/2022 revealed total cholesterol 159, LDL 57 and HDL of 32.  Obstructive hypertrophic cardiomyopathy (HCC) History of hypertrophic cardiomyopathy followed by Dr.Chandrasakhar currently on mavacamten 5 mg a day with improvement in his outflow tract gradient.  He feels clinically improved.  His cardiac MRI was consistent with this.  He is scheduled for a transesophageal echo on 09/13/2022 looking for an ASD and then referral to structural heart after that.  Elevated coronary artery calcium score Elevated coronary calcium score of 39 with nonobstructive CAD.  He denies chest pain.     Lorretta Harp MD FACP,FACC,FAHA, [facility] 05/30/2022 12:16 PM

## 2022-05-30 NOTE — Assessment & Plan Note (Addendum)
History of hyperlipidemia on statin therapy followed by his PCP.  His most recent lipid profile performed 02/26/2022 revealed total cholesterol 159, LDL 57 and HDL of 32.

## 2022-06-03 ENCOUNTER — Other Ambulatory Visit (HOSPITAL_COMMUNITY): Payer: Self-pay

## 2022-06-03 ENCOUNTER — Other Ambulatory Visit: Payer: Self-pay

## 2022-06-03 ENCOUNTER — Ambulatory Visit: Payer: PPO | Attending: Internal Medicine | Admitting: Internal Medicine

## 2022-06-03 ENCOUNTER — Encounter: Payer: Self-pay | Admitting: Internal Medicine

## 2022-06-03 VITALS — BP 130/58 | HR 67 | Ht 68.0 in | Wt 210.0 lb

## 2022-06-03 DIAGNOSIS — E782 Mixed hyperlipidemia: Secondary | ICD-10-CM | POA: Diagnosis not present

## 2022-06-03 DIAGNOSIS — I421 Obstructive hypertrophic cardiomyopathy: Secondary | ICD-10-CM

## 2022-06-03 DIAGNOSIS — R931 Abnormal findings on diagnostic imaging of heart and coronary circulation: Secondary | ICD-10-CM | POA: Diagnosis not present

## 2022-06-03 DIAGNOSIS — I10 Essential (primary) hypertension: Secondary | ICD-10-CM | POA: Diagnosis not present

## 2022-06-03 DIAGNOSIS — G4733 Obstructive sleep apnea (adult) (pediatric): Secondary | ICD-10-CM | POA: Diagnosis not present

## 2022-06-03 DIAGNOSIS — R0789 Other chest pain: Secondary | ICD-10-CM | POA: Diagnosis not present

## 2022-06-03 DIAGNOSIS — R200 Anesthesia of skin: Secondary | ICD-10-CM | POA: Diagnosis not present

## 2022-06-03 DIAGNOSIS — Q211 Atrial septal defect, unspecified: Secondary | ICD-10-CM

## 2022-06-03 MED ORDER — METOPROLOL SUCCINATE ER 100 MG PO TB24
100.0000 mg | ORAL_TABLET | Freq: Every day | ORAL | 3 refills | Status: DC
  Filled 2022-06-03: qty 90, 90d supply, fill #0
  Filled 2022-08-27: qty 90, 90d supply, fill #1

## 2022-06-03 NOTE — Patient Instructions (Signed)
Medication Instructions:  Your physician has recommended you make the following change in your medication:  DECREASE: metoprolol (Toprol) to 100 mg by mouth once daily  *If you need a refill on your cardiac medications before your next appointment, please call your pharmacy*   Lab Work: NONE  If you have labs (blood work) drawn today and your tests are completely normal, you will receive your results only by: [place] (if you have MyChart) OR A paper copy in the mail If you have any lab test that is abnormal or we need to change your treatment, we will call you to review the results.   Testing/Procedures: NONE   Follow-Up: At [facility], you and your health needs are our priority.  As part of our continuing mission to provide you with exceptional heart care, we have created designated Provider Care Teams.  These Care Teams include your primary Cardiologist (physician) and Advanced Practice Providers (APPs -  Physician Assistants and Nurse Practitioners) who all work together to provide you with the care you need, when you need it.  We recommend signing up for the patient portal called "MyChart".  Sign up information is provided on this After Visit Summary.  MyChart is used to connect with patients for Virtual Visits (Telemedicine).  Patients are able to view lab/test results, encounter notes, upcoming appointments, etc.  Non-urgent messages can be sent to your provider as well.   To learn more about what you can do with MyChart, go to [email or web address].    Your next appointment:   4 month(s)  Provider:   Rudean Haskell, MD

## 2022-06-10 DIAGNOSIS — I10 Essential (primary) hypertension: Secondary | ICD-10-CM | POA: Diagnosis not present

## 2022-06-10 LAB — LAB REPORT - SCANNED: EGFR: 43.5

## 2022-06-13 ENCOUNTER — Encounter (HOSPITAL_COMMUNITY): Payer: Self-pay | Admitting: Internal Medicine

## 2022-06-13 ENCOUNTER — Encounter (HOSPITAL_COMMUNITY)
Admission: RE | Disposition: A | Discharge status: Home / Self Care | Payer: Self-pay | Source: Home / Self Care | Attending: Internal Medicine

## 2022-06-13 ENCOUNTER — Ambulatory Visit (HOSPITAL_COMMUNITY): Payer: PPO | Admitting: Anesthesiology

## 2022-06-13 ENCOUNTER — Other Ambulatory Visit: Payer: Self-pay

## 2022-06-13 ENCOUNTER — Ambulatory Visit (HOSPITAL_COMMUNITY)
Admission: RE | Admit: 2022-06-13 | Discharge: 2022-06-13 | Disposition: A | Discharge status: Home / Self Care | Payer: PPO | Attending: Internal Medicine | Admitting: Internal Medicine

## 2022-06-13 ENCOUNTER — Ambulatory Visit (HOSPITAL_BASED_OUTPATIENT_CLINIC_OR_DEPARTMENT_OTHER): Payer: PPO | Admitting: Anesthesiology

## 2022-06-13 ENCOUNTER — Ambulatory Visit (HOSPITAL_BASED_OUTPATIENT_CLINIC_OR_DEPARTMENT_OTHER)
Admission: RE | Admit: 2022-06-13 | Discharge: 2022-06-13 | Disposition: A | Discharge status: Home / Self Care | Payer: PPO | Source: Ambulatory Visit | Attending: Internal Medicine | Admitting: Internal Medicine

## 2022-06-13 DIAGNOSIS — E669 Obesity, unspecified: Secondary | ICD-10-CM | POA: Diagnosis not present

## 2022-06-13 DIAGNOSIS — Z6831 Body mass index (BMI) 31.0-31.9, adult: Secondary | ICD-10-CM | POA: Diagnosis not present

## 2022-06-13 DIAGNOSIS — Q2111 Secundum atrial septal defect: Secondary | ICD-10-CM | POA: Insufficient documentation

## 2022-06-13 DIAGNOSIS — Q211 Atrial septal defect, unspecified: Secondary | ICD-10-CM | POA: Diagnosis not present

## 2022-06-13 DIAGNOSIS — I340 Nonrheumatic mitral (valve) insufficiency: Secondary | ICD-10-CM

## 2022-06-13 DIAGNOSIS — I10 Essential (primary) hypertension: Secondary | ICD-10-CM | POA: Diagnosis not present

## 2022-06-13 DIAGNOSIS — E782 Mixed hyperlipidemia: Secondary | ICD-10-CM | POA: Insufficient documentation

## 2022-06-13 DIAGNOSIS — I421 Obstructive hypertrophic cardiomyopathy: Secondary | ICD-10-CM

## 2022-06-13 DIAGNOSIS — I422 Other hypertrophic cardiomyopathy: Secondary | ICD-10-CM | POA: Insufficient documentation

## 2022-06-13 DIAGNOSIS — Z79899 Other long term (current) drug therapy: Secondary | ICD-10-CM | POA: Diagnosis not present

## 2022-06-13 HISTORY — PX: TEE WITHOUT CARDIOVERSION: SHX5443

## 2022-06-13 LAB — ECHO TEE

## 2022-06-13 SURGERY — ECHOCARDIOGRAM, TRANSESOPHAGEAL
Anesthesia: Monitor Anesthesia Care

## 2022-06-13 MED ORDER — GLYCOPYRROLATE 0.2 MG/ML IJ SOLN
INTRAMUSCULAR | Status: DC | PRN
  Administered 2022-06-13 (×2): .1 mg via INTRAVENOUS

## 2022-06-13 MED ORDER — SODIUM CHLORIDE 0.9 % IV SOLN: INTRAVENOUS | Status: DC

## 2022-06-13 MED ORDER — PROPOFOL 500 MG/50ML IV EMUL
INTRAVENOUS | Status: DC | PRN
  Administered 2022-06-13: 10 mg via INTRAVENOUS
  Administered 2022-06-13: 20 mg via INTRAVENOUS
  Administered 2022-06-13: 125 ug/kg/min via INTRAVENOUS

## 2022-06-13 MED ORDER — PHENYLEPHRINE 80 MCG/ML (10ML) SYRINGE FOR IV PUSH (FOR BLOOD PRESSURE SUPPORT)
PREFILLED_SYRINGE | INTRAVENOUS | Status: DC | PRN
  Administered 2022-06-13: 40 ug via INTRAVENOUS
  Administered 2022-06-13: 80 ug via INTRAVENOUS

## 2022-06-13 MED ORDER — EPHEDRINE SULFATE (PRESSORS) 50 MG/ML IJ SOLN
INTRAMUSCULAR | Status: DC | PRN
  Administered 2022-06-13: 5 mg via INTRAVENOUS

## 2022-06-13 NOTE — Discharge Instructions (Signed)

## 2022-06-13 NOTE — Progress Notes (Signed)
  Echocardiogram Echocardiogram Transesophageal has been performed.  Alan Spencer 06/13/2022, 10:59 AM

## 2022-06-13 NOTE — Anesthesia Preprocedure Evaluation (Signed)
Anesthesia Evaluation  Patient identified by MRN, date of birth, ID band Patient awake    Reviewed: Allergy & Precautions, NPO status , Patient's Chart, lab work & pertinent test results  Airway Mallampati: I       Dental no notable dental hx.    Pulmonary neg pulmonary ROS   Pulmonary exam normal        Cardiovascular hypertension, Pt. on medications and Pt. on home beta blockers Normal cardiovascular exam     Neuro/Psych negative neurological ROS  negative psych ROS   GI/Hepatic negative GI ROS, Neg liver ROS,,,  Endo/Other  negative endocrine ROS    Renal/GU negative Renal ROS  negative genitourinary   Musculoskeletal negative musculoskeletal ROS (+)    Abdominal  (+) + obese  Peds  Hematology   Anesthesia Other Findings   Reproductive/Obstetrics                             Anesthesia Physical Anesthesia Plan  ASA: 2  Anesthesia Plan: MAC   Post-op Pain Management: Minimal or no pain anticipated   Induction:   PONV Risk Score and Plan: Propofol infusion and TIVA  Airway Management Planned: Natural Airway and Mask  Additional Equipment: None  Intra-op Plan:   Post-operative Plan:   Informed Consent: I have reviewed the patients History and Physical, chart, labs and discussed the procedure including the risks, benefits and alternatives for the proposed anesthesia with the patient or authorized representative who has indicated his/her understanding and acceptance.     Dental advisory given  Plan Discussed with: CRNA  Anesthesia Plan Comments:        Anesthesia Quick Evaluation

## 2022-06-13 NOTE — Interval H&P Note (Signed)
History and Physical Interval Note:  06/13/2022 11:51 AM  Alan Spencer  has presented today for surgery, with the diagnosis of HYPERTROPHIC CARDIO MYOPATHY.  The various methods of treatment have been discussed with the patient and family. After consideration of risks, benefits and other options for treatment, the patient has consented to  Procedure(s): TRANSESOPHAGEAL ECHOCARDIOGRAM (TEE) (N/A) as a surgical intervention.  The patient's history has been reviewed, patient examined, no change in status, stable for surgery.  I have reviewed the patient's chart and labs.  Questions were answered to the patient's satisfaction.    Repeat entry.  Rande Dario A Charlton Boule

## 2022-06-13 NOTE — Transfer of Care (Signed)
Immediate Anesthesia Transfer of Care Note  Patient: Alan Spencer  Procedure(s) Performed: TRANSESOPHAGEAL ECHOCARDIOGRAM (TEE)  Patient Location: PACU  Anesthesia Type:MAC  Level of Consciousness: awake and patient cooperative  Airway & Oxygen Therapy: Patient Spontanous Breathing and Patient connected to nasal cannula oxygen  Post-op Assessment: Report given to RN, Post -op Vital signs reviewed and stable, and Patient moving all extremities  Post vital signs: Reviewed and stable  Last Vitals:  Vitals Value Taken Time  BP 104/53 06/13/22 1059  Temp 36.4 C 06/13/22 1059  Pulse 55 06/13/22 1059  Resp 15 06/13/22 1059  SpO2 95 % 06/13/22 1059    Last Pain:  Vitals:   06/13/22 1059  TempSrc:   PainSc: 0-No pain         Complications: No notable events documented.

## 2022-06-13 NOTE — CV Procedure (Signed)
    TRANSESOPHAGEAL ECHOCARDIOGRAM   NAME:  AMARIS SHARP    MRN: [id number] DOB:  July 06, 1955    ADMIT DATE: 06/13/2022  INDICATIONS: ASD  PROCEDURE:   Informed consent was obtained prior to the procedure. The risks, benefits and alternatives for the procedure were discussed and the patient comprehended these risks.  Risks include, but are not limited to, cough, sore throat, vomiting, nausea, somnolence, esophageal and stomach trauma or perforation, bleeding, low blood pressure, aspiration, pneumonia, infection, trauma to the teeth and death.    Procedural time out performed. The oropharynx was anesthetized with topical 1% benzocaine.    Anesthesia was administered by Dr. Jillyn Hidden and team.  The patient was administered a total of Propofol 300 mgto achieve and maintain moderate to deep conscious sedation.  The patient's heart rate, blood pressure, and oxygen saturation are monitored continuously during the procedure. The period of conscious sedation is 15 minutes, of which I was present face-to-face 100% of this time.   The transesophageal probe was inserted in the esophagus and stomach without difficulty and multiple views were obtained.   COMPLICATIONS:    There were no immediate complications.  KEY FINDINGS:  ASD with sufficient rims for closure- will describe in more detail in subsequent report..  Full report to follow. Further management per primary team.   Rudean Haskell, MD [place]  11:12 AM

## 2022-06-14 NOTE — Anesthesia Postprocedure Evaluation (Signed)
Anesthesia Post Note  Patient: Alan Spencer  Procedure(s) Performed: TRANSESOPHAGEAL ECHOCARDIOGRAM (TEE)     Patient location during evaluation: PACU Anesthesia Type: MAC Level of consciousness: awake Pain management: pain level controlled Vital Signs Assessment: post-procedure vital signs reviewed and stable Respiratory status: spontaneous breathing Cardiovascular status: stable Postop Assessment: no apparent nausea or vomiting Anesthetic complications: no  No notable events documented.  Last Vitals:  Vitals:   06/13/22 1110 06/13/22 1115  BP: 110/67   Pulse: (!) 47 (!) 47  Resp: 13 15  Temp:    SpO2: 96% 96%    Last Pain:  Vitals:   06/13/22 1059  TempSrc:   PainSc: 0-No pain                 Huston Foley

## 2022-06-16 ENCOUNTER — Encounter (HOSPITAL_COMMUNITY): Payer: Self-pay | Admitting: Internal Medicine

## 2022-06-16 ENCOUNTER — Other Ambulatory Visit (HOSPITAL_COMMUNITY): Payer: Self-pay

## 2022-06-16 MED ORDER — IRBESARTAN-HYDROCHLOROTHIAZIDE 300-12.5 MG PO TABS
1.0000 | ORAL_TABLET | Freq: Every day | ORAL | 3 refills | Status: DC
  Filled 2022-06-16 – 2022-06-17 (×2): qty 90, 90d supply, fill #0
  Filled 2022-06-20: qty 30, 30d supply, fill #0
  Filled 2022-06-20: qty 90, 90d supply, fill #0
  Filled 2022-07-18: qty 30, 30d supply, fill #1
  Filled 2022-07-26 – 2022-08-17 (×2): qty 30, 30d supply, fill #2
  Filled 2022-09-16: qty 30, 30d supply, fill #3
  Filled 2022-10-14: qty 30, 30d supply, fill #4
  Filled 2022-11-13: qty 30, 30d supply, fill #5
  Filled 2022-12-13: qty 30, 30d supply, fill #6
  Filled 2023-01-09: qty 30, 30d supply, fill #7
  Filled 2023-02-08: qty 30, 30d supply, fill #8
  Filled 2023-03-10: qty 30, 30d supply, fill #9
  Filled 2023-04-05: qty 30, 30d supply, fill #10
  Filled 2023-05-05: qty 30, 30d supply, fill #11

## 2022-06-17 ENCOUNTER — Other Ambulatory Visit: Payer: Self-pay

## 2022-06-17 DIAGNOSIS — G4733 Obstructive sleep apnea (adult) (pediatric): Secondary | ICD-10-CM | POA: Diagnosis not present

## 2022-06-20 ENCOUNTER — Other Ambulatory Visit (HOSPITAL_COMMUNITY): Payer: Self-pay

## 2022-06-20 ENCOUNTER — Other Ambulatory Visit: Payer: Self-pay

## 2022-06-21 ENCOUNTER — Other Ambulatory Visit (HOSPITAL_COMMUNITY): Payer: Self-pay

## 2022-06-23 ENCOUNTER — Other Ambulatory Visit (HOSPITAL_COMMUNITY): Payer: Self-pay

## 2022-07-04 ENCOUNTER — Encounter: Payer: Self-pay | Admitting: Cardiovascular Disease

## 2022-07-04 ENCOUNTER — Ambulatory Visit: Payer: PPO | Attending: Cardiovascular Disease | Admitting: Cardiovascular Disease

## 2022-07-04 ENCOUNTER — Other Ambulatory Visit: Payer: Self-pay

## 2022-07-04 VITALS — BP 112/61 | HR 46 | Ht 68.0 in | Wt 211.8 lb

## 2022-07-04 DIAGNOSIS — Z01810 Encounter for preprocedural cardiovascular examination: Secondary | ICD-10-CM

## 2022-07-04 DIAGNOSIS — Q211 Atrial septal defect, unspecified: Secondary | ICD-10-CM

## 2022-07-04 DIAGNOSIS — G4733 Obstructive sleep apnea (adult) (pediatric): Secondary | ICD-10-CM | POA: Diagnosis not present

## 2022-07-04 MED ORDER — SPIRONOLACTONE 25 MG PO TABS
25.0000 mg | ORAL_TABLET | Freq: Every day | ORAL | 3 refills | Status: DC
  Filled 2022-07-04: qty 90, 90d supply, fill #0
  Filled 2022-09-16: qty 90, 90d supply, fill #1
  Filled 2022-12-13: qty 90, 90d supply, fill #2
  Filled 2023-03-26: qty 90, 90d supply, fill #3

## 2022-07-04 MED ORDER — ASPIRIN 81 MG PO TBEC
81.0000 mg | DELAYED_RELEASE_TABLET | Freq: Every day | ORAL | 3 refills | Status: DC
Start: 2022-07-04 — End: 2022-08-25

## 2022-07-04 MED ORDER — CLOPIDOGREL BISULFATE 75 MG PO TABS
75.0000 mg | ORAL_TABLET | Freq: Every day | ORAL | 3 refills | Status: DC
Start: 2022-07-04 — End: 2022-07-10
  Filled 2022-07-04: qty 90, 90d supply, fill #0

## 2022-07-04 NOTE — Patient Instructions (Signed)
Medication Instructions:  START Aspirin  daily five days prior to procedure START Plavix/Clopidogrel  daily five days prior to procedure REFILLED Spironolactone  daily *If you need a refill on your cardiac medications before your next appointment, please call your pharmacy*  Lab Work: BMET, CBC today If you have labs (blood work) drawn today and your tests are completely normal, you will receive your results only by: MyChart Message (if you have MyChart) OR A paper copy in the mail If you have any lab test that is abnormal or we need to change your treatment, we will call you to review the results.  Testing/Procedures: ASD Closure (instructions below)  Follow-Up: At [facility], you and your health needs are our priority.  As part of our continuing mission to provide you with exceptional heart care, we have created designated Provider Care Teams.  These Care Teams include your primary Cardiologist (physician) and Advanced Practice Providers (APPs -  Physician Assistants and Nurse Practitioners) who all work together to provide you with the care you need, when you need it.  Your next appointment:   Structural Team will follow-up  Provider:   Tonny Bollman, MD     Other Instructions CLOSURE INSTRUCTIONS:  You will be called next Wednesday, 4/24, to confirm date of your procedure.   1. When your closure is scheduled, please arrive at the [facility] (Main Entrance A) at [facility]: [address] [place], [place] [address] two hours before your procedure time to ensure your preparation. Free valet parking service is available. You are allowed TWO visitors in the waiting room during your procedure. Both you and your guests must wear masks upon arrival.  Special note: Every effort is made to have your procedure done on time. Please understand that emergencies sometimes delay scheduled procedures.   2. Diet: Make sure to stay well hydrated the day before your  procedure! Do not eat solid foods after midnight. You may have clear liquids until 5am upon the day of the procedure.   3. Labs: TODAY! BMET, CBC   4. Medication instructions in preparation for your procedure:  1) HOLD hydralazine (Apresoline), hydrochlorothiazide (Hydrodiuril) and irbesartan-HCTZ (Avalide) the morning of your procedure. Otherwise, take all other medications (including aspirin and Plavix) as directed.   5. Plan for one night stay--bring personal belongings (this is a disclaimer, not an intention. We want you to go home!).  6. Bring a current list of your medications and current insurance cards.  7. You MUST have a responsible person to drive you home.  8. Someone MUST be with you the first 24 hours after you arrive home or your discharge will be delayed.  9. Please wear clothes that are easy to get on and off and wear slip-on shoes.    Your 1 month visit will be scheduled once your procedure date is confirmed.

## 2022-07-04 NOTE — H&P (View-Only) (Signed)
Cardiology Office Note:    Date:  07/04/2022   ID:  Alan Spencer, DOB 06/17/1955, MRN [id number]  PCP:  Paterson, Daniel G, MD   [facility] Providers Cardiologist:  Jonathan Berry, MD     Referring MD: Paterson, Daniel G, MD   Chief Complaint  Patient presents with   Atrial Septal Defect    History of Present Illness:    Alan Spencer is a 67 y.o. male referred by Dr Chandrasekhar for evaluation of atrial septal defect.   The patient has a longstanding history of resistant hypertension.  He has been diagnosed with hypertrophic cardiomyopathy.  The patient has required multidrug therapy for his hypertension and is followed by the advanced hypertension clinic.  The patient has started on mavacamten for treatment of hypertrophic cardiomyopathy with improvement in his LVOT gradient.  During his evaluation, he was found to have an ostium secundum atrial septal defect.  He underwent transesophageal echo June 13, 2022, confirming a moderate to large secundum ASD measuring about 13 mm in diameter on 2D imaging and larger than that on 3D imaging.  The patient reports that he has had mild exercise intolerance since his teen years, but has become more short of breath over the last 2 years.  He has not had any chest pain or chest pressure.  He denies orthopnea, PND, or leg edema.  He has no lightheadedness or syncope.  He reports no history of congenital heart disease in his family.  His father died of aortic stenosis.  His mother has hypertrophic cardiomyopathy.  Past Medical History:  Diagnosis Date   Anxiety    Essential hypertension, benign    Excessive daytime sleepiness 07/03/2015   Family history of colon cancer    HNP (herniated nucleus pulposus), lumbar    L4-L5   Mixed hyperlipidemia    Obesity (BMI 30-39.9) 01/10/2018   Obstructive hypertrophic cardiomyopathy 11/07/2021   Rectal bleeding    Resistant hypertension    Essential hypertension   Sleep apnea    uses CPAP     Past Surgical History:  Procedure Laterality Date   broken left elbow     NASAL SEPTUM SURGERY  2022   PARATHYROIDECTOMY N/A 12/27/2020   Procedure: NECK EXPLORATION WITH BIOPSY OF PARATHYROID GLAND x3;  Surgeon: Gerkin, Todd, MD;  Location: [facility];  Service: General;  Laterality: N/A;   TEE WITHOUT CARDIOVERSION N/A 06/13/2022   Procedure: TRANSESOPHAGEAL ECHOCARDIOGRAM (TEE);  Surgeon: Chandrasekhar, Mahesh A, MD;  Location: [facility];  Service: Cardiovascular;  Laterality: N/A;   TONSILLECTOMY      Current Medications: Current Meds  Medication Sig   amLODipine (NORVASC) 10 MG tablet Take 1 tablet (10 mg total) by mouth daily.   aspirin EC 81 MG tablet Take 1 tablet (81 mg total) by mouth daily. Swallow whole.   bimatoprost (LUMIGAN) 0.01 % SOLN Place 1 drop into both eyes daily as needed (Redness).   BIOTIN PO Take 1 tablet by mouth daily.   clopidogrel (PLAVIX) 75 MG tablet Take 1 tablet (75 mg total) by mouth daily. Starting 5 days prior to procedure.   Coenzyme Q10 (COQ10) 100 MG CAPS Take 100 mg by mouth daily.   fluticasone (FLONASE) 50 MCG/ACT nasal spray Place 1 spray into both nostrils daily.   glucosamine-chondroitin 500-400 MG tablet Take 1 tablet by mouth daily.   hydrALAZINE (APRESOLINE) 25 MG tablet Take 1 tablet (25 mg total) by mouth 3 (three) times daily as needed (for blood pressure above 160).     hydrochlorothiazide (HYDRODIURIL) 12.5 MG tablet Take 1 tablet (12.5 mg total) by mouth daily. (Patient taking differently: Take 12.5 mg by mouth at bedtime.)   irbesartan-hydrochlorothiazide (AVALIDE) 300-12.5 MG tablet Take 1 tablet by mouth daily.   mavacamten 5 MG CAPS Take 5 mg by mouth daily.   metoprolol succinate (TOPROL-XL) 100 MG 24 hr tablet Take 1 tablet (100 mg total) by mouth daily. Take with or immediately following a meal.   Multiple Vitamins-Minerals (MULTIVITAMIN WITH MINERALS) tablet Take 1 tablet by mouth daily.   olopatadine (PATADAY) 0.1 %  ophthalmic solution Place 1 drop into both eyes in the morning.   omega-3 acid ethyl esters (LOVAZA) 1 g capsule Take 2 g by mouth 2 (two) times daily.   rosuvastatin (CRESTOR) 20 MG tablet TAKE ONE TABLET BY MOUTH ONCE DAILY.   TRIAMCINOLONE ACETONIDE EX Apply 1 Application topically daily as needed (Skin rash).   [DISCONTINUED] spironolactone (ALDACTONE) 25 MG tablet Take 1 tablet (25 mg total) by mouth daily.     Allergies:   Patient has no known allergies.   Social History   Socioeconomic History   Marital status: Married    Spouse name: Not on file   Number of children: Not on file   Years of education: Not on file   Highest education level: Not on file  Occupational History   Not on file  Tobacco Use   Smoking status: Never   Smokeless tobacco: Never  Vaping Use   Vaping Use: Never used  Substance and Sexual Activity   Alcohol use: Yes    Comment: occas.   Drug use: No   Sexual activity: Not on file  Other Topics Concern   Not on file  Social History Narrative   Not on file   Social Determinants of Health   Financial Resource Strain: Low Risk  (11/07/2021)   Overall Financial Resource Strain (CARDIA)    Difficulty of Paying Living Expenses: Not hard at all  Food Insecurity: No Food Insecurity (11/07/2021)   Hunger Vital Sign    Worried About Running Out of Food in the Last Year: Never true    Ran Out of Food in the Last Year: Never true  Transportation Needs: Unknown (11/07/2021)   PRAPARE - Transportation    Lack of Transportation (Medical): No    Lack of Transportation (Non-Medical): Not on file  Physical Activity: Sufficiently Active (11/07/2021)   Exercise Vital Sign    Days of Exercise per Week: 7 days    Minutes of Exercise per Session: 70 min  Recent Concern: Physical Activity - Insufficiently Active (11/07/2021)   Exercise Vital Sign    Days of Exercise per Week: 4 days    Minutes of Exercise per Session: 30 min  Stress: Not on file  Social  Connections: Not on file     Family History: The patient's family history includes Colon cancer in his paternal grandmother; Hypertension in his mother; Hypertrophic cardiomyopathy in his mother; Kidney failure in his father.  ROS:   Please see the history of present illness.    All other systems reviewed and are negative.  EKGs/Labs/Other Studies Reviewed:    The following studies were reviewed today: Cardiac Studies & Procedures       ECHOCARDIOGRAM  ECHOCARDIOGRAM LIMITED 05/26/2022  Narrative ECHOCARDIOGRAM LIMITED REPORT    Patient Name:   Suheyb F Tally Date of Exam: 05/26/2022 Medical Rec #:  [id number]     Height:       68.0 in Accession #:      [id number]    Weight:       206.0 lb Date of Birth:  11/12/1955    BSA:          2.070 m Patient Age:    66 years      BP:           149/78 mmHg Patient Gender: M             HR:           43 bpm. Exam Location:  [address]  Procedure: 2D Echo, Limited Echo and Limited Color Doppler  Indications:    I42.1 HOCM  History:        Patient has prior history of Echocardiogram examinations, most recent 04/28/2022. Risk Factors:Hypertension and Dyslipidemia. Obstructive sleep apnea-CPAP.  Sonographer:    NaTashia Rodgers-Jones RDCS Referring Phys: [id number] MAHESH A CHANDRASEKHAR   Sonographer Comments: Supine -BP 149/78 Sitting- BP 145/79 IMPRESSIONS   1. No LVOT obstruction at current study; LVOT gradient 6 mm Hg. There is no SAM. Left ventricular ejection fraction, by estimation, is 65 to 70%. The left ventricle has normal function. There is severe asymmetric left ventricular hypertrophy. 2. Right ventricular systolic function is normal. The right ventricular size is moderately enlarged. 3. The mitral valve is normal in structure. Trivial mitral valve regurgitation.  Comparison(s): Prior images reviewed side by side. LVOT gradinet has resolved at rest and with Valsalva. RV is dilated, similar to prior. ASD, seen in prior,  has not been well visualized in this study.  FINDINGS Left Ventricle: No LVOT obstruction at current study; LVOT gradient 6 mm Hg. There is no SAM. Left ventricular ejection fraction, by estimation, is 65 to 70%. The left ventricle has normal function. The left ventricular internal cavity size was normal in size. There is severe asymmetric left ventricular hypertrophy.  Right Ventricle: The right ventricular size is moderately enlarged. Right ventricular systolic function is normal.  Mitral Valve: The mitral valve is normal in structure. Trivial mitral valve regurgitation.  Tricuspid Valve: The tricuspid valve is normal in structure. Tricuspid valve regurgitation is mild . No evidence of tricuspid stenosis.  LEFT VENTRICLE PLAX 2D LVIDd:         4.40 cm Diastology LVIDs:         2.40 cm LV e' medial:    9.25 cm/s LV PW:         1.00 cm LV E/e' medial:  10.6 LV IVS:        1.00 cm LV e' lateral:   9.94 cm/s LV E/e' lateral: 9.8   RIGHT VENTRICLE RV S prime:     14.50 cm/s  LEFT ATRIUM         Index LA diam:    5.10 cm 2.46 cm/m AORTIC VALVE LVOT Vmax:   112.50 cm/s LVOT Vmean:  78.550 cm/s LVOT VTI:    0.262 m  AORTA Ao Root diam: 3.50 cm  MITRAL VALVE               TRICUSPID VALVE MV Area (PHT): 3.53 cm    TR Peak grad:   27.2 mmHg MV Decel Time: 215 msec    TR Vmax:        261.00 cm/s MV E velocity: 97.90 cm/s MV A velocity: 75.60 cm/s  SHUNTS MV E/A ratio:  1.29        Systemic VTI: 0.26 m  Mahesh Chandrasekhar MD Electronically signed by Mahesh Chandrasekhar MD Signature Date/Time: 05/26/2022/3:34:37 PM    Final     TEE  ECHO TEE 06/13/2022  Narrative TRANSESOPHOGEAL ECHO REPORT    Patient Name:   Tydarius F Mcswain Date of Exam: 06/13/2022 Medical Rec #:  [id number]     Height:       68.0 in Accession #:    [id number]    Weight:       206.0 lb Date of Birth:  08/05/1955    BSA:          2.070 m Patient Age:    66 years      BP:           100/50 mmHg Patient  Gender: M             HR:           55 bpm. Exam Location:  Inpatient  Procedure: 3D Echo, Transesophageal Echo, Cardiac Doppler and Color Doppler  Indications:     Q21.1 ASD  History:         Patient has prior history of Echocardiogram examinations, most recent 05/26/2022. Signs/Symptoms:Dyspnea and Shortness of Breath; Risk Factors:Sleep Apnea and Dyslipidemia. ASD. HOCM. Chordal SAM.  Sonographer:     Tina West RDCS Referring Phys:  [id number] MAHESH A CHANDRASEKHAR Diagnosing Phys: Mahesh Chandrasekhar MD  PROCEDURE: After discussion of the risks and benefits of a TEE, an informed consent was obtained from the patient. The transesophogeal probe was passed without difficulty through the esophogus of the patient. Imaged were obtained with the patient in a left lateral decubitus position. Sedation performed by different physician. The patient was monitored while under deep sedation. Anesthestetic sedation was provided intravenously by Anesthesiology: 155mg of Propofol. The patient's vital signs; including heart rate, blood pressure, and oxygen saturation; remained stable throughout the procedure. The patient developed no complications during the procedure. Transgastric views not obtained.  IMPRESSIONS   1. Evidence of atrial level shunting detected by color flow Doppler. There is a moderately sized secundum atrial septal defect with predominantly left to right shunting across the atrial septum. Largest dimensions 2.39 cm by 2.22 cm. Smallest rims are anterior (5 mm) and superior (6 mm). Sufficient dimensions for an Amplatzer ASD- 024 device. 2. Chordal SAM with no significant LVOT gradient.. Left ventricular ejection fraction, by estimation, is 65 to 70%. The left ventricle has normal function. There is severe asymmetric left ventricular hypertrophy of the septal segment. 3. Right ventricular systolic function is normal. The right ventricular size is moderately enlarged. 4. No left  atrial/left atrial appendage thrombus was detected. 5. The mitral valve is normal in structure. Mild mitral valve regurgitation. No evidence of mitral stenosis. 6. The aortic valve is tricuspid. Aortic valve regurgitation is not visualized. No aortic stenosis is present.  FINDINGS Left Ventricle: Chordal SAM with no significant LVOT gradient. Left ventricular ejection fraction, by estimation, is 65 to 70%. The left ventricle has normal function. The left ventricular internal cavity size was normal in size. There is severe asymmetric left ventricular hypertrophy of the septal segment.  Right Ventricle: The right ventricular size is moderately enlarged. Right vetricular wall thickness was not well visualized. Right ventricular systolic function is normal.  Left Atrium: Left atrial size was normal in size. No left atrial/left atrial appendage thrombus was detected.  Right Atrium: Right atrial size was normal in size.  Pericardium: There is no evidence of pericardial effusion.  Mitral Valve: The mitral valve is normal in structure. Mild mitral valve regurgitation. No evidence of mitral valve stenosis.  Tricuspid Valve: The tricuspid valve is normal in structure.   Tricuspid valve regurgitation is not demonstrated. No evidence of tricuspid stenosis.  Aortic Valve: The aortic valve is tricuspid. Aortic valve regurgitation is not visualized. No aortic stenosis is present.  Pulmonic Valve: The pulmonic valve was not well visualized. Pulmonic valve regurgitation is not visualized. No evidence of pulmonic stenosis.  Aorta: The aortic root and ascending aorta are structurally normal, with no evidence of dilitation.  IAS/Shunts: Evidence of atrial level shunting detected by color flow Doppler. There is a moderately sized secundum atrial septal defect with predominantly left to right shunting across the atrial septum.  Additional Comments: Spectral Doppler performed.  Mahesh Chandrasekhar  MD Electronically signed by Mahesh Chandrasekhar MD Signature Date/Time: 06/13/2022/4:51:57 PM    Final   MONITORS  LONG TERM MONITOR (3-14 DAYS) 01/08/2022  Narrative Patch Wear Time:  13 days and 19 hours (2023-09-30T13:19:04-398 to 2023-10-14T09:06:05-398)  Patient had a min HR of 27 bpm, max HR of 129 bpm, and avg HR of 57 bpm. Predominant underlying rhythm was Sinus Rhythm. First Degree AV Block was present. 2 Supraventricular Tachycardia runs occurred, the run with the fastest interval lasting 4 beats with a max rate of 129 bpm, the longest lasting 4 beats with an avg rate of 110 bpm. 33 Pauses occurred, the longest lasting 3.5 secs (17 bpm). Isolated SVEs were rare (<1.0%), SVE Couplets were rare (<1.0%), and SVE Triplets were rare (<1.0%). Isolated VEs were rare (<1.0%, 199), and no VE Couplets or VE Triplets were present.  1: SR/SB (down to 20 BPM in AM hours)/ST 2.  Short runs of SVT 3: Pauses up to 3.5 seconds   CT SCANS  CT CORONARY MORPH W/CTA COR W/SCORE 04/26/2021  Addendum 04/26/2021 12:53 PM ADDENDUM REPORT: 04/26/2021 12:51  CLINICAL DATA:  Dyspnea with coronary risk factors  EXAM: Cardiac CTA  MEDICATIONS: Sub lingual nitro. 4 mg and lopressor 100mg  TECHNIQUE: The patient was scanned on a Siemens Force 192 scanner. Gantry rotation speed was 250 msecs. Collimation was. 6 mm . A 120 kV prospective scan was triggered in the ascending thoracic aorta at 140 HU's with full mA between 30-70% of the R-R interval . Average HR during the scan was 45 bpm. The 3D data set was interpreted on a dedicated work station using MPR, MIP and VRT modes. A total of 80 cc of contrast was used.  FINDINGS: Non-cardiac: See separate report from [facility]. No significant findings on limited lung and soft tissue windows.  Calcium score: Mild 2 vessel calcium noted  LM 0  LAD 0.305  LCX 0  RCA 39.5  Total: 39.8 which is 41 st percentile for  age/sex  Coronary Arteries: Right dominant with no anomalies  LM: Normal  LAD: 1-24% calcified plaque in mid vessel  D1: Normal  D2: Normal  D3 Normal  Circumflex: Normal  OM1: Normal  OM2: Normal  OM3: Normal  RCA: 1-24% calcified plaque in proximal, mid and distal vessel  PDA: Normal  PLA: Normal  IMPRESSION: 1. Calcium score 39.8 which is 41 st percentile for age/sex  2.  Normal ascending thoracic aorta 3.6 cm  3.  CAD RADS 1 non obstructive CAD see description above  Peter Nishan   Electronically Signed By: Peter  Nishan M.D. On: 04/26/2021 12:51  Narrative EXAM: OVER-READ INTERPRETATION  CT CHEST  The following report is an over-read performed by radiologist Dr. Daniel Entrikin of [facility] on 04/26/2021. This over-read does not include interpretation of cardiac or coronary anatomy or pathology. The coronary calcium   score/coronary CTA interpretation by the cardiologist is attached.  COMPARISON:  No priors.  FINDINGS: Atherosclerotic calcifications in the thoracic aorta. Within the visualized portions of the thorax there are no suspicious appearing pulmonary nodules or masses, there is no acute consolidative airspace disease, no pleural effusions, no pneumothorax and no lymphadenopathy. Visualized portions of the upper abdomen are unremarkable. There are no aggressive appearing lytic or blastic lesions noted in the visualized portions of the skeleton.  IMPRESSION: 1.  Aortic Atherosclerosis (ICD10-I70.0).  Electronically Signed: By: Daniel  Entrikin M.D. On: 04/26/2021 12:04   CARDIAC MRI  MR CARDIAC MORPHOLOGY W WO CONTRAST 09/05/2021  Narrative CLINICAL DATA:  Clinical question of hypertrophic cardiomyopathy Study assumes HCT of 45 and BSA of 2.12 m2.  EXAM: CARDIAC MRI  TECHNIQUE: The patient was scanned on a 1.5 Tesla GE magnet. A dedicated cardiac coil was used. Functional imaging was done using  Fiesta sequences. 2,3, and 4 chamber views were done to assess for RWMA's. Modified Simpson's rule using a short axis stack was used to calculate an ejection fraction on a dedicated work station using Circle software. The patient received 10 cc of Gadavist. After 10 minutes inversion recovery sequences were used to assess for infiltration and scar tissue.  CONTRAST:  10 cc  of Gadavist  FINDINGS: 1. Normal left ventricular size, with LVEDD 46 mm, and LVEDVi 46 mL/m2.  Normal left ventricular thickness, with intraventricular septal thickness of 15 mm, posterior wall thickness of 10 mm, though myocardial mass index normal for gender at 65 g/m2.  Normal left ventricular systolic function (LVEF =63%). There are no regional wall motion abnormalities. There is late peaking chordal systolic motion of the anterior mitral valve (chordal SAM).  Left ventricular parametric mapping notable for normal ECV signal and normal T2 signal.  There is no late gadolinium enhancement in the left ventricular myocardium. Six standard deviation reference used.  2. Normal right ventricular size with RVEDVI 87 mL/m2.  Normal right ventricular thickness.  Normal right ventricular systolic function (RVEF =52%). There are no regional wall motion abnormalities or aneurysms.  3.  Normal left and right atrial size.  4. Normal size of the aortic root, ascending aorta and pulmonary artery.  5. Valve assessment:  Aortic Valve: Tri-leaflet aortic valve. Regurgitant fraction 14%, mean gradient of 1 mm Hg. Mild central aortic regurgitation.  Pulmonic Valve: Regurgitant fraction 2%, mean gradient < 1 mm Hg. Qualitatively, there is no significant regurgitation.  Tricuspid Valve: Regurgitant fraction 10%. Mild central tricuspid regurgitation  Mitral Valve: Regurgitant fraction 16%.  Mild mitral regurgitation.  6.  Normal pericardium.  No pericardial effusion.  7. Grossly, no extracardiac findings.  Recommended dedicated study if concerned for non-cardiac pathology.  IMPRESSION: In absence of system disease leading to hypertrophy, no evidence of infiltrative disease and consistent with hypertrophic cardiomyopathy.  There is qualitatively turbulent LVOT flow often seen in hypertrophic cardiomyopathy.  No evidence of scar.  Mahesh Chandrasekhar MD   Electronically Signed By: Mahesh   Chandrasekhar M.D. On: 09/05/2021 07:37          EKG:  EKG is not ordered today.    Recent Labs: 07/30/2021: Hemoglobin 15.8; Platelets 222  Recent Lipid Panel No results found for: "CHOL", "TRIG", "HDL", "CHOLHDL", "VLDL", "LDLCALC", "LDLDIRECT"   Risk Assessment/Calculations:                Physical Exam:    VS:  BP 112/61   Pulse (!) 46   Ht 5' 8" (1.727 m)     Wt 211 lb 12.8 oz (96.1 kg)   SpO2 97%   BMI 32.20 kg/m     Wt Readings from Last 3 Encounters:  07/04/22 211 lb 12.8 oz (96.1 kg)  06/13/22 206 lb (93.4 kg)  06/03/22 210 lb (95.3 kg)     GEN:  Well nourished, well developed in no acute distress HEENT: Normal NECK: No JVD; No carotid bruits LYMPHATICS: No lymphadenopathy CARDIAC: RRR, 2/6 harsh midsystolic murmur at the left lower sternal border RESPIRATORY:  Clear to auscultation without rales, wheezing or rhonchi  ABDOMEN: Soft, non-tender, non-distended MUSCULOSKELETAL:  No edema; No deformity  SKIN: Warm and dry NEUROLOGIC:  Alert and oriented x 3 PSYCHIATRIC:  Normal affect   ASSESSMENT:    1. ASD (atrial septal defect)   2. Pre-procedural cardiovascular examination    PLAN:    In order of problems listed above:  I personally reviewed the patient's noninvasive imaging studies.  His transesophageal echo shows normal LV size with LVH and a pattern of hypertrophic cardiomyopathy with preserved LVEF.  He has a moderate-sized ostium secundum atrial septal defect with sufficient tissue rims for device closure.  There is mild RV dilatation present.  The  patient meets criteria for transcatheter ASD closure with evidence of right heart enlargement.  He would be expected to have progressive diastolic dysfunction as he ages which will increase his left to right shunt if his ASD is not treated.  I reviewed the risks, indications, and alternatives to transcatheter ASD closure with the patient.  I demonstrated the Amplatzer atrial septal occluder device and went through the procedural steps with him.  He understands the risks of the procedure include but are not limited to bleeding, vascular injury, stroke, myocardial infarction, device embolization, late device erosion, cardiac injury with tamponade, need for emergent pericardiocentesis, need for emergent surgery, and late complications such as endocarditis or device thrombus.  He understands the risk of serious complication occur at an incidence of approximately 1%.  The incidence of atrial fibrillation may be as high as 2 to 3%.  The patient expresses full understanding and provides full informed consent for the procedure.  He will be started on aspirin and clopidogrel 5 days before the procedure.  He understands this will include an overnight stay in the hospital, limited echo the following morning, and use of DAPT with aspirin and clopidogrel for 6 months following the procedure.           Medication Adjustments/Labs and Tests Ordered: Current medicines are reviewed at length with the patient today.  Concerns regarding medicines are outlined above.  Orders Placed This Encounter  Procedures   Basic metabolic panel   CBC   EKG 12-Lead   Meds ordered this encounter  Medications   aspirin EC 81 MG tablet    Sig: Take 1 tablet (81 mg total) by mouth daily. Swallow whole.    Dispense:  90 tablet    Refill:  3   clopidogrel (PLAVIX) 75 MG tablet    Sig: Take 1 tablet (75 mg total) by mouth daily. Starting 5 days prior to procedure.    Dispense:  90 tablet    Refill:  3   spironolactone (ALDACTONE)  25 MG tablet    Sig: Take 1 tablet (25 mg total) by mouth daily.    Dispense:  90 tablet    Refill:  3    Patient Instructions  Medication Instructions:  START Aspirin 81mg daily five days prior to procedure START Plavix/Clopidogrel 75mg daily   five days prior to procedure REFILLED Spironolactone 25mg daily *If you need a refill on your cardiac medications before your next appointment, please call your pharmacy*  Lab Work: BMET, CBC today If you have labs (blood work) drawn today and your tests are completely normal, you will receive your results only by: MyChart Message (if you have MyChart) OR A paper copy in the mail If you have any lab test that is abnormal or we need to change your treatment, we will call you to review the results.  Testing/Procedures: ASD Closure (instructions below)  Follow-Up: At [facility], you and your health needs are our priority.  As part of our continuing mission to provide you with exceptional heart care, we have created designated Provider Care Teams.  These Care Teams include your primary Cardiologist (physician) and Advanced Practice Providers (APPs -  Physician Assistants and Nurse Practitioners) who all work together to provide you with the care you need, when you need it.  Your next appointment:   Structural Team will follow-up  Provider:   Marvel Mcphillips, MD     Other Instructions CLOSURE INSTRUCTIONS:  You will be called next Wednesday, 4/24, to confirm date of your procedure.   1. When your closure is scheduled, please arrive at the [facility] (Main Entrance A) at [facility]: [address] [place], [place] [address] two hours before your procedure time to ensure your preparation. Free valet parking service is available. You are allowed TWO visitors in the waiting room during your procedure. Both you and your guests must wear masks upon arrival.  Special note: Every effort is made to have your procedure done on time.  Please understand that emergencies sometimes delay scheduled procedures.   2. Diet: Make sure to stay well hydrated the day before your procedure! Do not eat solid foods after midnight. You may have clear liquids until 5am upon the day of the procedure.   3. Labs: TODAY! BMET, CBC   4. Medication instructions in preparation for your procedure:  1) HOLD hydralazine (Apresoline), hydrochlorothiazide (Hydrodiuril) and irbesartan-HCTZ (Avalide) the morning of your procedure. Otherwise, take all other medications (including aspirin and Plavix) as directed.   5. Plan for one night stay--bring personal belongings (this is a disclaimer, not an intention. We want you to go home!).  6. Bring a current list of your medications and current insurance cards.  7. You MUST have a responsible person to drive you home.  8. Someone MUST be with you the first 24 hours after you arrive home or your discharge will be delayed.  9. Please wear clothes that are easy to get on and off and wear slip-on shoes.    Your 1 month visit will be scheduled once your procedure date is confirmed.    Signed, Klea Nall, MD  07/04/2022 11:19 AM    [facility] 

## 2022-07-04 NOTE — Progress Notes (Signed)
Cardiology Office Note:    Date:  07/04/2022   ID:  Alan Spencer, DOB [birth year], MRN [id number]  PCP:  Garlan Fillers, MD   [facility] Providers Cardiologist:  Nanetta Batty, MD     Referring MD: Garlan Fillers, MD   Chief Complaint  Patient presents with   Atrial Septal Defect    History of Present Illness:    Alan Spencer is a 67 y.o. male referred by Dr Izora Ribas for evaluation of atrial septal defect.   The patient has a longstanding history of resistant hypertension.  He has been diagnosed with hypertrophic cardiomyopathy.  The patient has required multidrug therapy for his hypertension and is followed by the advanced hypertension clinic.  The patient has started on mavacamten for treatment of hypertrophic cardiomyopathy with improvement in his LVOT gradient.  During his evaluation, he was found to have an ostium secundum atrial septal defect.  He underwent transesophageal echo June 13, 2022, confirming a moderate to large secundum ASD measuring about 13 mm in diameter on 2D imaging and larger than that on 3D imaging.  The patient reports that he has had mild exercise intolerance since his teen years, but has become more short of breath over the last 2 years.  He has not had any chest pain or chest pressure.  He denies orthopnea, PND, or leg edema.  He has no lightheadedness or syncope.  He reports no history of congenital heart disease in his family.  His father died of aortic stenosis.  His mother has hypertrophic cardiomyopathy.  Past Medical History:  Diagnosis Date   Anxiety    Essential hypertension, benign    Excessive daytime sleepiness 07/03/2015   Family history of colon cancer    HNP (herniated nucleus pulposus), lumbar    L4-L5   Mixed hyperlipidemia    Obesity (BMI 30-39.9) 01/10/2018   Obstructive hypertrophic cardiomyopathy 11/07/2021   Rectal bleeding    Resistant hypertension    Essential hypertension   Sleep apnea    uses CPAP     Past Surgical History:  Procedure Laterality Date   broken left elbow     NASAL SEPTUM SURGERY  2022   PARATHYROIDECTOMY N/A 12/27/2020   Procedure: NECK EXPLORATION WITH BIOPSY OF PARATHYROID GLAND x3;  Surgeon: Darnell Level, MD;  Location: [facility];  Service: General;  Laterality: N/A;   TEE WITHOUT CARDIOVERSION N/A 06/13/2022   Procedure: TRANSESOPHAGEAL ECHOCARDIOGRAM (TEE);  Surgeon: Christell Constant, MD;  Location: [facility] ENDOSCOPY;  Service: Cardiovascular;  Laterality: N/A;   TONSILLECTOMY      Current Medications: Current Meds  Medication Sig   amLODipine (NORVASC) 10 MG tablet Take 1 tablet (10 mg total) by mouth daily.   aspirin EC 81 MG tablet Take 1 tablet (81 mg total) by mouth daily. Swallow whole.   bimatoprost (LUMIGAN) 0.01 % SOLN Place 1 drop into both eyes daily as needed (Redness).   BIOTIN PO Take 1 tablet by mouth daily.   clopidogrel (PLAVIX) 75 MG tablet Take 1 tablet (75 mg total) by mouth daily. Starting 5 days prior to procedure.   Coenzyme Q10 (COQ10) 100 MG CAPS Take 100 mg by mouth daily.   fluticasone (FLONASE) 50 MCG/ACT nasal spray Place 1 spray into both nostrils daily.   glucosamine-chondroitin 500-400 MG tablet Take 1 tablet by mouth daily.   hydrALAZINE (APRESOLINE) 25 MG tablet Take 1 tablet (25 mg total) by mouth 3 (three) times daily as needed (for blood pressure above 160).  hydrochlorothiazide (HYDRODIURIL) 12.5 MG tablet Take 1 tablet (12.5 mg total) by mouth daily. (Patient taking differently: Take 12.5 mg by mouth at bedtime.)   irbesartan-hydrochlorothiazide (AVALIDE) 300-12.5 MG tablet Take 1 tablet by mouth daily.   mavacamten 5 MG CAPS Take 5 mg by mouth daily.   metoprolol succinate (TOPROL-XL) 100 MG 24 hr tablet Take 1 tablet (100 mg total) by mouth daily. Take with or immediately following a meal.   Multiple Vitamins-Minerals (MULTIVITAMIN WITH MINERALS) tablet Take 1 tablet by mouth daily.   olopatadine (PATADAY) 0.1 %  ophthalmic solution Place 1 drop into both eyes in the morning.   omega-3 acid ethyl esters (LOVAZA) 1 g capsule Take 2 g by mouth 2 (two) times daily.   rosuvastatin (CRESTOR) 20 MG tablet TAKE ONE TABLET BY MOUTH ONCE DAILY.   TRIAMCINOLONE ACETONIDE EX Apply 1 Application topically daily as needed (Skin rash).   [DISCONTINUED] spironolactone (ALDACTONE) 25 MG tablet Take 1 tablet (25 mg total) by mouth daily.     Allergies:   Patient has no known allergies.   Social History   Socioeconomic History   Marital status: Married    Spouse name: Not on file   Number of children: Not on file   Years of education: Not on file   Highest education level: Not on file  Occupational History   Not on file  Tobacco Use   Smoking status: Never   Smokeless tobacco: Never  Vaping Use   Vaping Use: Never used  Substance and Sexual Activity   Alcohol use: Yes    Comment: occas.   Drug use: No   Sexual activity: Not on file  Other Topics Concern   Not on file  Social History Narrative   Not on file   Social Determinants of Health   Financial Resource Strain: Low Risk  (11/07/2021)   Overall Financial Resource Strain (CARDIA)    Difficulty of Paying Living Expenses: Not hard at all  Food Insecurity: No Food Insecurity (11/07/2021)   Hunger Vital Sign    Worried About Running Out of Food in the Last Year: Never true    Ran Out of Food in the Last Year: Never true  Transportation Needs: Unknown (11/07/2021)   PRAPARE - Administrator, Civil Service (Medical): No    Lack of Transportation (Non-Medical): Not on file  Physical Activity: Sufficiently Active (11/07/2021)   Exercise Vital Sign    Days of Exercise per Week: 7 days    Minutes of Exercise per Session: 70 min  Recent Concern: Physical Activity - Insufficiently Active (11/07/2021)   Exercise Vital Sign    Days of Exercise per Week: 4 days    Minutes of Exercise per Session: 30 min  Stress: Not on file  Social  Connections: Not on file     Family History: The patient's family history includes Colon cancer in his paternal grandmother; Hypertension in his mother; Hypertrophic cardiomyopathy in his mother; Kidney failure in his father.  ROS:   Please see the history of present illness.    All other systems reviewed and are negative.  EKGs/Labs/Other Studies Reviewed:    The following studies were reviewed today: Cardiac Studies & Procedures       ECHOCARDIOGRAM  ECHOCARDIOGRAM LIMITED 05/26/2022  Narrative ECHOCARDIOGRAM LIMITED REPORT    Patient Name:   MAXIMILLION GILL Date of Exam: 05/26/2022 Medical Rec #:  [id number]     Height:       68.0 in Accession #:  [id number]    Weight:       206.0 lb Date of Birth:  [birth year]    BSA:          2.070 m Patient Age:    66 years      BP:           149/78 mmHg Patient Gender: M             HR:           43 bpm. Exam Location:  [address]  Procedure: 2D Echo, Limited Echo and Limited Color Doppler  Indications:    I42.1 HOCM  History:        Patient has prior history of Echocardiogram examinations, most recent 04/28/2022. Risk Factors:Hypertension and Dyslipidemia. Obstructive sleep apnea-CPAP.  Sonographer:    Daphine Deutscher RDCS Referring Phys: [id number] [facility] A CHANDRASEKHAR   Sonographer Comments: Supine -BP 149/78 Sitting- BP 145/79 IMPRESSIONS   1. No LVOT obstruction at current study; LVOT gradient 6 mm Hg. There is no SAM. Left ventricular ejection fraction, by estimation, is 65 to 70%. The left ventricle has normal function. There is severe asymmetric left ventricular hypertrophy. 2. Right ventricular systolic function is normal. The right ventricular size is moderately enlarged. 3. The mitral valve is normal in structure. Trivial mitral valve regurgitation.  Comparison(s): Prior images reviewed side by side. LVOT gradinet has resolved at rest and with Valsalva. RV is dilated, similar to prior. ASD, seen in prior,  has not been well visualized in this study.  FINDINGS Left Ventricle: No LVOT obstruction at current study; LVOT gradient 6 mm Hg. There is no SAM. Left ventricular ejection fraction, by estimation, is 65 to 70%. The left ventricle has normal function. The left ventricular internal cavity size was normal in size. There is severe asymmetric left ventricular hypertrophy.  Right Ventricle: The right ventricular size is moderately enlarged. Right ventricular systolic function is normal.  Mitral Valve: The mitral valve is normal in structure. Trivial mitral valve regurgitation.  Tricuspid Valve: The tricuspid valve is normal in structure. Tricuspid valve regurgitation is mild . No evidence of tricuspid stenosis.  LEFT VENTRICLE PLAX 2D LVIDd:         4.40 cm Diastology LVIDs:         2.40 cm LV e' medial:    9.25 cm/s LV PW:         1.00 cm LV E/e' medial:  10.6 LV IVS:        1.00 cm LV e' lateral:   9.94 cm/s LV E/e' lateral: 9.8   RIGHT VENTRICLE RV S prime:     14.50 cm/s  LEFT ATRIUM         Index LA diam:    5.10 cm 2.46 cm/m AORTIC VALVE LVOT Vmax:   112.50 cm/s LVOT Vmean:  78.550 cm/s LVOT VTI:    0.262 m  AORTA Ao Root diam: 3.50 cm  MITRAL VALVE               TRICUSPID VALVE MV Area (PHT): 3.53 cm    TR Peak grad:   27.2 mmHg MV Decel Time: 215 msec    TR Vmax:        261.00 cm/s MV E velocity: 97.90 cm/s MV A velocity: 75.60 cm/s  SHUNTS MV E/A ratio:  1.29        Systemic VTI: 0.26 m  Riley Lam MD Electronically signed by Riley Lam MD Signature Date/Time: 05/26/2022/3:34:37 PM    Final  TEE  ECHO TEE 06/13/2022  Narrative TRANSESOPHOGEAL ECHO REPORT    Patient Name:   TAREZ BOWNS Date of Exam: 06/13/2022 Medical Rec #:  [id number]     Height:       68.0 in Accession #:    [id number]    Weight:       206.0 lb Date of Birth:  Aug 04, 1955    BSA:          2.070 m Patient Age:    66 years      BP:           100/50 mmHg Patient  Gender: M             HR:           55 bpm. Exam Location:  Inpatient  Procedure: 3D Echo, Transesophageal Echo, Cardiac Doppler and Color Doppler  Indications:     Q21.1 ASD  History:         Patient has prior history of Echocardiogram examinations, most recent 05/26/2022. Signs/Symptoms:Dyspnea and Shortness of Breath; Risk Factors:Sleep Apnea and Dyslipidemia. ASD. HOCM. Chordal SAM.  Sonographer:     Sheralyn Boatman RDCS Referring Phys:  [id number] [facility] A Izora Ribas Diagnosing Phys: Riley Lam MD  PROCEDURE: After discussion of the risks and benefits of a TEE, an informed consent was obtained from the patient. The transesophogeal probe was passed without difficulty through the esophogus of the patient. Imaged were obtained with the patient in a left lateral decubitus position. Sedation performed by different physician. The patient was monitored while under deep sedation. Anesthestetic sedation was provided intravenously by Anesthesiology: 155mg  of Propofol. The patient's vital signs; including heart rate, blood pressure, and oxygen saturation; remained stable throughout the procedure. The patient developed no complications during the procedure. Transgastric views not obtained.  IMPRESSIONS   1. Evidence of atrial level shunting detected by color flow Doppler. There is a moderately sized secundum atrial septal defect with predominantly left to right shunting across the atrial septum. Largest dimensions 2.39 cm by 2.22 cm. Smallest rims are anterior (5 mm) and superior (6 mm). Sufficient dimensions for an Amplatzer ASD- 024 device. 2. Chordal SAM with no significant LVOT gradient.. Left ventricular ejection fraction, by estimation, is 65 to 70%. The left ventricle has normal function. There is severe asymmetric left ventricular hypertrophy of the septal segment. 3. Right ventricular systolic function is normal. The right ventricular size is moderately enlarged. 4. No left  atrial/left atrial appendage thrombus was detected. 5. The mitral valve is normal in structure. Mild mitral valve regurgitation. No evidence of mitral stenosis. 6. The aortic valve is tricuspid. Aortic valve regurgitation is not visualized. No aortic stenosis is present.  FINDINGS Left Ventricle: Chordal SAM with no significant LVOT gradient. Left ventricular ejection fraction, by estimation, is 65 to 70%. The left ventricle has normal function. The left ventricular internal cavity size was normal in size. There is severe asymmetric left ventricular hypertrophy of the septal segment.  Right Ventricle: The right ventricular size is moderately enlarged. Right vetricular wall thickness was not well visualized. Right ventricular systolic function is normal.  Left Atrium: Left atrial size was normal in size. No left atrial/left atrial appendage thrombus was detected.  Right Atrium: Right atrial size was normal in size.  Pericardium: There is no evidence of pericardial effusion.  Mitral Valve: The mitral valve is normal in structure. Mild mitral valve regurgitation. No evidence of mitral valve stenosis.  Tricuspid Valve: The tricuspid valve is normal in structure.  Tricuspid valve regurgitation is not demonstrated. No evidence of tricuspid stenosis.  Aortic Valve: The aortic valve is tricuspid. Aortic valve regurgitation is not visualized. No aortic stenosis is present.  Pulmonic Valve: The pulmonic valve was not well visualized. Pulmonic valve regurgitation is not visualized. No evidence of pulmonic stenosis.  Aorta: The aortic root and ascending aorta are structurally normal, with no evidence of dilitation.  IAS/Shunts: Evidence of atrial level shunting detected by color flow Doppler. There is a moderately sized secundum atrial septal defect with predominantly left to right shunting across the atrial septum.  Additional Comments: Spectral Doppler performed.  Riley Lam  MD Electronically signed by Riley Lam MD Signature Date/Time: 06/13/2022/4:51:57 PM    Final   MONITORS  LONG TERM MONITOR (3-14 DAYS) 01/08/2022  Narrative Patch Wear Time:  13 days and 19 hours (2023-09-30T13:19:04-398 to 2023-10-14T09:06:05-398)  Patient had a min HR of 27 bpm, max HR of 129 bpm, and avg HR of 57 bpm. Predominant underlying rhythm was Sinus Rhythm. First Degree AV Block was present. 2 Supraventricular Tachycardia runs occurred, the run with the fastest interval lasting 4 beats with a max rate of 129 bpm, the longest lasting 4 beats with an avg rate of 110 bpm. 33 Pauses occurred, the longest lasting 3.5 secs (17 bpm). Isolated SVEs were rare (<1.0%), SVE Couplets were rare (<1.0%), and SVE Triplets were rare (<1.0%). Isolated VEs were rare (<1.0%, 199), and no VE Couplets or VE Triplets were present.  1: SR/SB (down to 20 BPM in AM hours)/ST 2.  Short runs of SVT 3: Pauses up to 3.5 seconds   CT SCANS  CT CORONARY MORPH W/CTA COR W/SCORE 04/26/2021  Addendum 04/26/2021 12:53 PM ADDENDUM REPORT: 04/26/2021 12:51  CLINICAL DATA:  Dyspnea with coronary risk factors  EXAM: Cardiac CTA  MEDICATIONS: Sub lingual nitro. 4 mg and lopressor 100mg   TECHNIQUE: The patient was scanned on a [employer] 192 scanner. Gantry rotation speed was 250 msecs. Collimation was. 6 mm . A 120 kV prospective scan was triggered in the ascending thoracic aorta at 140 HU's with full mA between 30-70% of the R-R interval . Average HR during the scan was 45 bpm. The 3D data set was interpreted on a dedicated work station using MPR, MIP and VRT modes. A total of 80 cc of contrast was used.  FINDINGS: Non-cardiac: See separate report from [facility]. No significant findings on limited lung and soft tissue windows.  Calcium score: Mild 2 vessel calcium noted  LM 0  LAD 0.305  LCX 0  RCA 39.5  Total: 39.8 which is 41 st percentile for  age/sex  Coronary Arteries: Right dominant with no anomalies  LM: Normal  LAD: 1-24% calcified plaque in mid vessel  D1: Normal  D2: Normal  D3 Normal  Circumflex: Normal  OM1: Normal  OM2: Normal  OM3: Normal  RCA: 1-24% calcified plaque in proximal, mid and distal vessel  PDA: Normal  PLA: Normal  IMPRESSION: 1. Calcium score 39.8 which is 41 st percentile for age/sex  2.  Normal ascending thoracic aorta 3.6 cm  3.  CAD RADS 1 non obstructive CAD see description above  Charlton Haws   Electronically Signed By: Charlton Haws M.D. On: 04/26/2021 12:51  Narrative EXAM: OVER-READ INTERPRETATION  CT CHEST  The following report is an over-read performed by radiologist Dr. Trudie Reed of [facility] on 04/26/2021. This over-read does not include interpretation of cardiac or coronary anatomy or pathology. The coronary calcium  score/coronary CTA interpretation by the cardiologist is attached.  COMPARISON:  No priors.  FINDINGS: Atherosclerotic calcifications in the thoracic aorta. Within the visualized portions of the thorax there are no suspicious appearing pulmonary nodules or masses, there is no acute consolidative airspace disease, no pleural effusions, no pneumothorax and no lymphadenopathy. Visualized portions of the upper abdomen are unremarkable. There are no aggressive appearing lytic or blastic lesions noted in the visualized portions of the skeleton.  IMPRESSION: 1.  Aortic Atherosclerosis (ICD10-I70.0).  Electronically Signed: By: Trudie Reed M.D. On: 04/26/2021 12:04   CARDIAC MRI  MR CARDIAC MORPHOLOGY W WO CONTRAST 09/05/2021  Narrative CLINICAL DATA:  Clinical question of hypertrophic cardiomyopathy Study assumes HCT of 45 and BSA of 2.12 m2.  EXAM: CARDIAC MRI  TECHNIQUE: The patient was scanned on a 1.5 Tesla GE magnet. A dedicated cardiac coil was used. Functional imaging was done using  Fiesta sequences. 2,3, and 4 chamber views were done to assess for RWMA's. Modified Simpson's rule using a short axis stack was used to calculate an ejection fraction on a dedicated work [employer]. The patient received 10 cc of Gadavist. After 10 minutes inversion recovery sequences were used to assess for infiltration and scar tissue.  CONTRAST:  10 cc  of Gadavist  FINDINGS: 1. Normal left ventricular size, with LVEDD 46 mm, and LVEDVi 46 mL/m2.  Normal left ventricular thickness, with intraventricular septal thickness of 15 mm, posterior wall thickness of 10 mm, though myocardial mass index normal for gender at 65 g/m2.  Normal left ventricular systolic function (LVEF =63%). There are no regional wall motion abnormalities. There is late peaking chordal systolic motion of the anterior mitral valve (chordal SAM).  Left ventricular parametric mapping notable for normal ECV signal and normal T2 signal.  There is no late gadolinium enhancement in the left ventricular myocardium. Six standard deviation reference used.  2. Normal right ventricular size with RVEDVI 87 mL/m2.  Normal right ventricular thickness.  Normal right ventricular systolic function (RVEF =52%). There are no regional wall motion abnormalities or aneurysms.  3.  Normal left and right atrial size.  4. Normal size of the aortic root, ascending aorta and pulmonary artery.  5. Valve assessment:  Aortic Valve: Tri-leaflet aortic valve. Regurgitant fraction 14%, mean gradient of 1 mm Hg. Mild central aortic regurgitation.  Pulmonic Valve: Regurgitant fraction 2%, mean gradient < 1 mm Hg. Qualitatively, there is no significant regurgitation.  Tricuspid Valve: Regurgitant fraction 10%. Mild central tricuspid regurgitation  Mitral Valve: Regurgitant fraction 16%.  Mild mitral regurgitation.  6.  Normal pericardium.  No pericardial effusion.  7. Grossly, no extracardiac findings.  Recommended dedicated study if concerned for non-cardiac pathology.  IMPRESSION: In absence of system disease leading to hypertrophy, no evidence of infiltrative disease and consistent with hypertrophic cardiomyopathy.  There is qualitatively turbulent LVOT flow often seen in hypertrophic cardiomyopathy.  No evidence of scar.  Riley Lam MD   Electronically Signed By: Riley Lam M.D. On: 09/05/2021 07:37          EKG:  EKG is not ordered today.    Recent Labs: 07/30/2021: Hemoglobin 15.8; Platelets 222  Recent Lipid Panel No results found for: "CHOL", "TRIG", "HDL", "CHOLHDL", "VLDL", "LDLCALC", "LDLDIRECT"   Risk Assessment/Calculations:                Physical Exam:    VS:  BP 112/61   Pulse (!) 46   Ht 5\' 8"  (1.727 m)  Wt 211 lb 12.8 oz (96.1 kg)   SpO2 97%   BMI 32.20 kg/m     Wt Readings from Last 3 Encounters:  07/04/22 211 lb 12.8 oz (96.1 kg)  06/13/22 206 lb (93.4 kg)  06/03/22 210 lb (95.3 kg)     GEN:  Well nourished, well developed in no acute distress HEENT: Normal NECK: No JVD; No carotid bruits LYMPHATICS: No lymphadenopathy CARDIAC: RRR, 2/6 harsh midsystolic murmur at the left lower sternal border RESPIRATORY:  Clear to auscultation without rales, wheezing or rhonchi  ABDOMEN: Soft, non-tender, non-distended MUSCULOSKELETAL:  No edema; No deformity  SKIN: Warm and dry NEUROLOGIC:  Alert and oriented x 3 PSYCHIATRIC:  Normal affect   ASSESSMENT:    1. ASD (atrial septal defect)   2. Pre-procedural cardiovascular examination    PLAN:    In order of problems listed above:  I personally reviewed the patient's noninvasive imaging studies.  His transesophageal echo shows normal LV size with LVH and a pattern of hypertrophic cardiomyopathy with preserved LVEF.  He has a moderate-sized ostium secundum atrial septal defect with sufficient tissue rims for device closure.  There is mild RV dilatation present.  The  patient meets criteria for transcatheter ASD closure with evidence of right heart enlargement.  He would be expected to have progressive diastolic dysfunction as he ages which will increase his left to right shunt if his ASD is not treated.  I reviewed the risks, indications, and alternatives to transcatheter ASD closure with the patient.  I demonstrated the Amplatzer atrial septal occluder device and went through the procedural steps with him.  He understands the risks of the procedure include but are not limited to bleeding, vascular injury, stroke, myocardial infarction, device embolization, late device erosion, cardiac injury with tamponade, need for emergent pericardiocentesis, need for emergent surgery, and late complications such as endocarditis or device thrombus.  He understands the risk of serious complication occur at an incidence of approximately 1%.  The incidence of atrial fibrillation may be as high as 2 to 3%.  The patient expresses full understanding and provides full informed consent for the procedure.  He will be started on aspirin and clopidogrel 5 days before the procedure.  He understands this will include an overnight stay in the hospital, limited echo the following morning, and use of DAPT with aspirin and clopidogrel for 6 months following the procedure.           Medication Adjustments/Labs and Tests Ordered: Current medicines are reviewed at length with the patient today.  Concerns regarding medicines are outlined above.  Orders Placed This Encounter  Procedures   Basic metabolic panel   CBC   EKG 12-Lead   Meds ordered this encounter  Medications   aspirin EC 81 MG tablet    Sig: Take 1 tablet (81 mg total) by mouth daily. Swallow whole.    Dispense:  90 tablet    Refill:  3   clopidogrel (PLAVIX) 75 MG tablet    Sig: Take 1 tablet (75 mg total) by mouth daily. Starting 5 days prior to procedure.    Dispense:  90 tablet    Refill:  3   spironolactone (ALDACTONE)  25 MG tablet    Sig: Take 1 tablet (25 mg total) by mouth daily.    Dispense:  90 tablet    Refill:  3    Patient Instructions  Medication Instructions:  START Aspirin 81mg  daily five days prior to procedure START Plavix/Clopidogrel 75mg  daily  five days prior to procedure REFILLED Spironolactone  daily *If you need a refill on your cardiac medications before your next appointment, please call your pharmacy*  Lab Work: BMET, CBC today If you have labs (blood work) drawn today and your tests are completely normal, you will receive your results only by: MyChart Message (if you have MyChart) OR A paper copy in the mail If you have any lab test that is abnormal or we need to change your treatment, we will call you to review the results.  Testing/Procedures: ASD Closure (instructions below)  Follow-Up: At [facility], you and your health needs are our priority.  As part of our continuing mission to provide you with exceptional heart care, we have created designated Provider Care Teams.  These Care Teams include your primary Cardiologist (physician) and Advanced Practice Providers (APPs -  Physician Assistants and Nurse Practitioners) who all work together to provide you with the care you need, when you need it.  Your next appointment:   Structural Team will follow-up  Provider:   Tonny Bollman, MD     Other Instructions CLOSURE INSTRUCTIONS:  You will be called next Wednesday, 4/24, to confirm date of your procedure.   1. When your closure is scheduled, please arrive at the [facility] (Main Entrance A) at [facility]: [address] [place], [place] [address] two hours before your procedure time to ensure your preparation. Free valet parking service is available. You are allowed TWO visitors in the waiting room during your procedure. Both you and your guests must wear masks upon arrival.  Special note: Every effort is made to have your procedure done on time.  Please understand that emergencies sometimes delay scheduled procedures.   2. Diet: Make sure to stay well hydrated the day before your procedure! Do not eat solid foods after midnight. You may have clear liquids until 5am upon the day of the procedure.   3. Labs: TODAY! BMET, CBC   4. Medication instructions in preparation for your procedure:  1) HOLD hydralazine (Apresoline), hydrochlorothiazide (Hydrodiuril) and irbesartan-HCTZ (Avalide) the morning of your procedure. Otherwise, take all other medications (including aspirin and Plavix) as directed.   5. Plan for one night stay--bring personal belongings (this is a disclaimer, not an intention. We want you to go home!).  6. Bring a current list of your medications and current insurance cards.  7. You MUST have a responsible person to drive you home.  8. Someone MUST be with you the first 24 hours after you arrive home or your discharge will be delayed.  9. Please wear clothes that are easy to get on and off and wear slip-on shoes.    Your 1 month visit will be scheduled once your procedure date is confirmed.    Signed, Tonny Bollman, MD  07/04/2022 11:19 AM    [facility]

## 2022-07-05 LAB — BASIC METABOLIC PANEL
BUN/Creatinine Ratio: 26 — ABNORMAL HIGH (ref 10–24)
BUN: 36 mg/dL — ABNORMAL HIGH (ref 8–27)
CO2: 21 mmol/L (ref 20–29)
Calcium: 11 mg/dL — ABNORMAL HIGH (ref 8.6–10.2)
Chloride: 103 mmol/L (ref 96–106)
Creatinine, Ser: 1.37 mg/dL — ABNORMAL HIGH (ref 0.76–1.27)
Glucose: 106 mg/dL — ABNORMAL HIGH (ref 70–99)
Potassium: 4.1 mmol/L (ref 3.5–5.2)
Sodium: 139 mmol/L (ref 134–144)
eGFR: 57 mL/min/{1.73_m2} — ABNORMAL LOW (ref >59)

## 2022-07-05 LAB — CBC
Hematocrit: 45.4 % (ref 37.5–51.0)
Hemoglobin: 14.7 g/dL (ref 13.0–17.7)
MCH: 29 pg (ref 26.6–33.0)
MCHC: 32.4 g/dL (ref 31.5–35.7)
MCV: 90 fL (ref 79–97)
Platelets: 192 10*3/uL (ref 150–450)
RBC: 5.07 x10E6/uL (ref 4.14–5.80)
RDW: 14 % (ref 11.6–15.4)
WBC: 8 10*3/uL (ref 3.4–10.8)

## 2022-07-08 NOTE — Progress Notes (Signed)
Virtual Visit via Video Note   Because of Alan Spencer's co-morbid illnesses, he is at least at moderate risk for complications without adequate follow up.  This format is felt to be most appropriate for this patient at this time.  All issues noted in this document were discussed and addressed.  A limited physical exam was performed with this format.  Please refer to the patient's chart for his consent to telehealth for [facility].   The patient was identified using 2 identifiers.  Date:  07/10/2022   ID:  Alan Spencer, DOB 07/22/1955, MRN [id number]  Patient Location: Home Provider Location: Office/Clinic  PCP:  Garlan Fillers, MD  Cardiologist:  Nanetta Batty, MD  Electrophysiologist:  None   Evaluation Performed:  Follow-Up Visit  CC: Hypertension  History of Present Illness:    The patient does not have symptoms concerning for COVID-19 infection (fever, chills, cough, or new shortness of breath).   Alan Spencer is a 67 y.o. male with a hx of hypertrophic cardiomyopathy, hypertension, hyperparathyroidism, hyperlipidemia, and OSA on CPAP, here for follow-up. He was initially seen 11/07/2021 in the [facility]. He has struggled with resistant hypertension in the 160s-170s. He had renal artery dopplers 06/2021 that had normal blood flow. Dr. Allyson Sabal referred him to the [facility]. He also had an echo 04/2021 suggestive of HCM. LVEF was 70-75% with moderate hypertrophy of the basal septum (1.45cm). He had a mild gradient with peak velocity of 1.6 m/s that increased to 3.2 m/s with valsalva. He was referred to Dr. Glean Hess and had a cardiac MRI 08/2021 that was felt to be consistent with HCM. There was no LGE. He previously had a coronary CT 2/23 with minimal plaque in the RCA. His calcium score was 40 which was 41st percentile.   Alan Spencer reported having more energy off of carvedilol. It was thought he would be a very good candidate  for renal denervation. We recommended enrollment in the [facility]iance trio trial.  For this, spironolactone was stopped. In the interim HCTZ was increased to 25 mg.  At follow-up his blood pressure was higher in the office.  He ultimately did not qualify for that study because his blood pressure was controlled on 24-hour ambulatory monitor. On review of his imaging he was noted to have HCM, so he was started on mavacamten. 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 He saw Dr. Excell Seltzer 06/2022 and is scheduled to have ASD closure 07/24/22.  At his visit 01/2022, blood pressures remained uncontrolled. He had not qualified for the renal denervation study as his diastolic blood pressures were under 90 on ambulatory monitor. His systolics remained 140s-160s. Today, he reports he is not doing as well considering his upcoming ASD closure. Since starting mavacamten, he hasn't noticed much of a difference in how he feels. Although he does note that his LVOT has improved. Generally his blood pressure has been more controlled. He is not sure if this is more related to the [facility] or spironolactone. Outside the office he has seen blood pressures ranging from the 100s-130s systolic, but mostly with readings such as 125 and 130 systolic. Since hydralazine was prescribed he has only needed to use it maybe once or twice. This morning his blood pressure was 101/61 and he has been feeling a little dizzy. He wasn't feeling dizzy at his last visit with Dr. Excell Seltzer. On average, his heart rate is usually in the 50s-60s. For  exercise, he continues to play tennis frequently. He is concerned that his glucose was 106 per his BMP 06/2022. He was not fasting at the time. He also expressed concern with his other lab results including Albumin, Creatinine, GFR. Calcium was also elevated, which he states has happened in the past. He denies any palpitations, chest pain, shortness of breath, or peripheral edema. No headaches, syncope,  orthopnea, or PND.  Previous Antihypertensives:   Past Medical History:  Diagnosis Date   Anxiety    Essential hypertension, benign    Excessive daytime sleepiness 07/03/2015   Family history of colon cancer    HNP (herniated nucleus pulposus), lumbar    L4-L5   Mixed hyperlipidemia    Obesity (BMI 30-39.9) 01/10/2018   Obstructive hypertrophic cardiomyopathy 11/07/2021   Rectal bleeding    Resistant hypertension    Essential hypertension   Sleep apnea    uses CPAP    Past Surgical History:  Procedure Laterality Date   broken left elbow     NASAL SEPTUM SURGERY  2022   PARATHYROIDECTOMY N/A 12/27/2020   Procedure: NECK EXPLORATION WITH BIOPSY OF PARATHYROID GLAND x3;  Surgeon: Darnell Level, MD;  Location: [facility];  Service: General;  Laterality: N/A;   TEE WITHOUT CARDIOVERSION N/A 06/13/2022   Procedure: TRANSESOPHAGEAL ECHOCARDIOGRAM (TEE);  Surgeon: Christell Constant, MD;  Location: [facility] ENDOSCOPY;  Service: Cardiovascular;  Laterality: N/A;   TONSILLECTOMY      Current Medications: Current Meds  Medication Sig   amLODipine (NORVASC) 10 MG tablet Take 1 tablet (10 mg total) by mouth daily.   aspirin EC 81 MG tablet Take 1 tablet (81 mg total) by mouth daily. Swallow whole.   bimatoprost (LUMIGAN) 0.01 % SOLN Place 1 drop into both eyes daily as needed (Redness).   BIOTIN PO Take 1 tablet by mouth daily.   clopidogrel (PLAVIX) 75 MG tablet Take 1 tablet (75 mg total) by mouth daily. Starting 5 days prior to procedure.   Coenzyme Q10 (COQ10) 100 MG CAPS Take 100 mg by mouth daily.   fluticasone (FLONASE) 50 MCG/ACT nasal spray Place 1 spray into both nostrils daily.   glucosamine-chondroitin 500-400 MG tablet Take 1 tablet by mouth daily.   hydrALAZINE (APRESOLINE) 25 MG tablet Take 1 tablet (25 mg total) by mouth 3 (three) times daily as needed (for blood pressure above 160).   irbesartan-hydrochlorothiazide (AVALIDE) 300-12.5 MG tablet Take 1 tablet by mouth daily.    mavacamten 5 MG CAPS Take 5 mg by mouth daily.   metoprolol succinate (TOPROL-XL) 100 MG 24 hr tablet Take 1 tablet (100 mg total) by mouth daily. Take with or immediately following a meal.   Multiple Vitamins-Minerals (MULTIVITAMIN WITH MINERALS) tablet Take 1 tablet by mouth daily.   olopatadine (PATADAY) 0.1 % ophthalmic solution Place 1 drop into both eyes in the morning.   omega-3 acid ethyl esters (LOVAZA) 1 g capsule Take 2 g by mouth 2 (two) times daily.   rosuvastatin (CRESTOR) 20 MG tablet TAKE ONE TABLET BY MOUTH ONCE DAILY.   spironolactone (ALDACTONE) 25 MG tablet Take 1 tablet (25 mg total) by mouth daily.   TRIAMCINOLONE ACETONIDE EX Apply 1 Application topically daily as needed (Skin rash).   [DISCONTINUED] hydrochlorothiazide (HYDRODIURIL) 12.5 MG tablet Take 1 tablet (12.5 mg total) by mouth daily. (Patient taking differently: Take 12.5 mg by mouth at bedtime.)     Allergies:   Patient has no known allergies.   Social History   Socioeconomic History  Marital status: Married    Spouse name: Not on file   Number of children: Not on file   Years of education: Not on file   Highest education level: Not on file  Occupational History   Not on file  Tobacco Use   Smoking status: Never   Smokeless tobacco: Never  Vaping Use   Vaping Use: Never used  Substance and Sexual Activity   Alcohol use: Yes    Comment: occas.   Drug use: No   Sexual activity: Not on file  Other Topics Concern   Not on file  Social History Narrative   Not on file   Social Determinants of Health   Financial Resource Strain: Low Risk  (11/07/2021)   Overall Financial Resource Strain (CARDIA)    Difficulty of Paying Living Expenses: Not hard at all  Food Insecurity: No Food Insecurity (11/07/2021)   Hunger Vital Sign    Worried About Running Out of Food in the Last Year: Never true    Ran Out of Food in the Last Year: Never true  Transportation Needs: Unknown (11/07/2021)   PRAPARE -  Administrator, Civil Service (Medical): No    Lack of Transportation (Non-Medical): Not on file  Physical Activity: Sufficiently Active (11/07/2021)   Exercise Vital Sign    Days of Exercise per Week: 7 days    Minutes of Exercise per Session: 70 min  Recent Concern: Physical Activity - Insufficiently Active (11/07/2021)   Exercise Vital Sign    Days of Exercise per Week: 4 days    Minutes of Exercise per Session: 30 min  Stress: Not on file  Social Connections: Not on file     Family History: The patient's family history includes Colon cancer in his paternal grandmother; Hypertension in his mother; Hypertrophic cardiomyopathy in his mother; Kidney failure in his father.  ROS:   Please see the history of present illness.    (+) Dizziness All other systems reviewed and are negative.  EKGs/Labs/Other Studies Reviewed:    TEE  06/13/2022:  1. Evidence of atrial level shunting detected by color flow Doppler.  There is a moderately sized secundum atrial septal defect with  predominantly left to right shunting across the atrial septum. Largest  dimensions 2.39 cm by 2.22 cm. Smallest rims are  anterior (5 mm) and superior (6 mm). Sufficient dimensions for an  Amplatzer ASD- 024 device.   2. Chordal SAM with no significant LVOT gradient.. Left ventricular  ejection fraction, by estimation, is 65 to 70%. The left ventricle has  normal function. There is severe asymmetric left ventricular hypertrophy  of the septal segment.   3. Right ventricular systolic function is normal. The right ventricular  size is moderately enlarged.   4. No left atrial/left atrial appendage thrombus was detected.   5. The mitral valve is normal in structure. Mild mitral valve  regurgitation. No evidence of mitral stenosis.   6. The aortic valve is tricuspid. Aortic valve regurgitation is not  visualized. No aortic stenosis is present.   TEE 01/20/2022: IMPRESSIONS   1. Limited echo for HOCM    2. HOCM with 10 mmHg rest gradient- valsalva was not entirely accurate,  however, delta appears to be between 32 and 60 mmHg. Left ventricular  ejection fraction, by estimation, is 70 to 75%. Left ventricular ejection  fraction by 3D volume is 71 %. The  left ventricle has hyperdynamic function. The left ventricle has no  regional wall motion abnormalities. There  is moderate asymmetric left  ventricular hypertrophy of the basal-septal segment and otherwise mild  concentric hypertrophy.   3. The average left ventricular global longitudinal strain is -22.0 %.  The global longitudinal strain is normal.   4. The aortic valve is tricuspid. Aortic valve regurgitation is not  visualized.   Comparison(s): Changes from prior study are noted. 04/19/2021: LVEF 70-75%,  peak gradient 10 mmHg - increased to 41.1 mmHg with valsalva.   LT Monitor 01/07/2022: Patch Wear Time:  13 days and 19 hours (2023-09-30T13:19:04-398 to 2023-10-14T09:06:05-398)   Patient had a min HR of 27 bpm, max HR of 129 bpm, and avg HR of 57 bpm. Predominant underlying rhythm was Sinus Rhythm. First Degree AV Block was present. 2 Supraventricular Tachycardia runs occurred, the run with the fastest interval lasting 4 beats  with a max rate of 129 bpm, the longest lasting 4 beats with an avg rate of 110 bpm. 33 Pauses occurred, the longest lasting 3.5 secs (17 bpm). Isolated SVEs were rare (<1.0%), SVE Couplets were rare (<1.0%), and SVE Triplets were rare (<1.0%). Isolated  VEs were rare (<1.0%, 199), and no VE Couplets or VE Triplets were present.   1: SR/SB (down to 20 BPM in AM hours)/ST 2.  Short runs of SVT 3: Pauses up to 3.5 seconds  Cardiac MRI 09/04/2021: FINDINGS: 1. Normal left ventricular size, with LVEDD 46 mm, and LVEDVi 46 mL/m2.   Normal left ventricular thickness, with intraventricular septal thickness of 15 mm, posterior wall thickness of 10 mm, though myocardial mass index normal for gender at 65 g/m2.    Normal left ventricular systolic function (LVEF =63%). There are no regional wall motion abnormalities. There is late peaking chordal systolic motion of the anterior mitral valve (chordal SAM).   Left ventricular parametric mapping notable for normal ECV signal and normal T2 signal.   There is no late gadolinium enhancement in the left ventricular myocardium. Six standard deviation reference used.   2. Normal right ventricular size with RVEDVI 87 mL/m2.   Normal right ventricular thickness.   Normal right ventricular systolic function (RVEF =52%). There are no regional wall motion abnormalities or aneurysms.   3.  Normal left and right atrial size.   4. Normal size of the aortic root, ascending aorta and pulmonary artery.   5. Valve assessment:   Aortic Valve: Tri-leaflet aortic valve. Regurgitant fraction 14%, mean gradient of 1 mm Hg. Mild central aortic regurgitation.   Pulmonic Valve: Regurgitant fraction 2%, mean gradient < 1 mm Hg. Qualitatively, there is no significant regurgitation.   Tricuspid Valve: Regurgitant fraction 10%. Mild central tricuspid regurgitation   Mitral Valve: Regurgitant fraction 16%.  Mild mitral regurgitation.   6.  Normal pericardium.  No pericardial effusion.   7. Grossly, no extracardiac findings. Recommended dedicated study if concerned for non-cardiac pathology.   IMPRESSION: In absence of system disease leading to hypertrophy, no evidence of infiltrative disease and consistent with hypertrophic cardiomyopathy.   There is qualitatively turbulent LVOT flow often seen in hypertrophic cardiomyopathy.   No evidence of scar.  Renal Artery Doppler 07/02/2021: Summary:  Largest Aortic Diameter: 1.9 cm     Renal:     Right: Normal size right kidney. Normal cortical thickness of right         kidney. No evidence of right renal artery stenosis. Cyst(s)         noted. RRV flow present. Abnormal right Resistive Index.          Avascular  cystic structure in lower polen of the kidney,         measuring 2.7 x 2.0 x 3.3 cm.  Left:  Normal size of left kidney. Normal cortical thickness of the         left kidney. No evidence of left renal artery stenosis. LRV         flow present. Abnormal left Resisitve Index.  Mesenteric:  Normal Celiac artery and Superior Mesenteric artery findings.     Patent IVC.  Cardiac CTA 04/26/2021: FINDINGS: Non-cardiac: See separate report from [facility]. No significant findings on limited lung and soft tissue windows.   Calcium score: Mild 2 vessel calcium noted   LM 0   LAD 0.305   LCX 0   RCA 39.5   Total: 39.8 which is 41 st percentile for age/sex   Coronary Arteries: Right dominant with no anomalies   LM: Normal   LAD: 1-24% calcified plaque in mid vessel   D1: Normal   D2: Normal   D3 Normal   Circumflex: Normal   OM1: Normal   OM2: Normal   OM3: Normal   RCA: 1-24% calcified plaque in proximal, mid and distal vessel   PDA: Normal   PLA: Normal   IMPRESSION: 1. Calcium score 39.8 which is 41 st percentile for age/sex   2.  Normal ascending thoracic aorta 3.6 cm   3.  CAD RADS 1 non obstructive CAD see description above  Echocardiogram 04/19/2021: IMPRESSIONS     1. Moderate hypertophy of the basal septum with otherwise mild concentric  LVH. Mild intracavitary gradient. Peak velocity 1.6 m/s. Peak gradient 10  mmHg. Velocity increases to 3.2 m/s and peak gradient increases to 41.2  mmHg. Left ventricular ejection  fraction, by estimation, is 70 to 75%. The left ventricle has hyperdynamic  function. The left ventricle has no regional wall motion abnormalities.  There is moderate left ventricular hypertrophy of the basal-septal  segment. Left ventricular diastolic  parameters are consistent with Grade I diastolic dysfunction (impaired  relaxation). The average left ventricular global longitudinal strain is  -20.4 %. The global  longitudinal strain is normal.   2. Right ventricular systolic function is normal. The right ventricular  size is mildly enlarged. There is normal pulmonary artery systolic  pressure.   3. Left atrial size was severely dilated.   4. Right atrial size was severely dilated.   5. The mitral valve is normal in structure. Trivial mitral valve  regurgitation. No evidence of mitral stenosis.   6. The aortic valve is tricuspid. There is mild calcification of the  aortic valve. There is mild thickening of the aortic valve. Aortic valve  regurgitation is not visualized. No aortic stenosis is present.   7. Aortic dilatation noted. There is mild dilatation of the ascending  aorta, measuring 37 mm.   8. The inferior vena cava is dilated in size with >50% respiratory  variability, suggesting right atrial pressure of 8 mmHg.     EKG:  EKG is personally reviewed. 07/10/2022:  EKG was not ordered. 02/05/2022: EKG was not ordered.  12/02/2021:  EKG was not ordered. 11/07/2021:  EKG was not ordered.  Recent Labs: 07/04/2022: BUN 36; Creatinine, Ser 1.37; Hemoglobin 14.7; Platelets 192; Potassium 4.1; Sodium 139   Recent Lipid Panel No results found for: "CHOL", "TRIG", "HDL", "CHOLHDL", "VLDL", "LDLCALC", "LDLDIRECT"  Physical Exam:    VS:  BP 101/61   Pulse 66   Ht 5\' 8"  (1.727 m)   Wt  206 lb (93.4 kg)   BMI 31.32 kg/m  , BMI Body mass index is 31.32 kg/m. GENERAL:  Well appearing HEENT: Pupils equal round and reactive, fundi not visualized, oral mucosa unremarkable NECK:  No jugular venous distention, waveform within normal limits, carotid upstroke brisk and symmetric, no bruits, no thyromegaly LUNGS:  Clear to auscultation bilaterally HEART:  RRR.  PMI not displaced or sustained,S1 and S2 within normal limits, no S3, no S4, no clicks, no rubs, III/VI systolic murmur at the LUSB ABD:  Flat, positive bowel sounds normal in frequency in pitch, no bruits, no rebound, no guarding, no midline  pulsatile mass, no hepatomegaly, no splenomegaly EXT:  2 plus pulses throughout, no edema, no cyanosis no clubbing SKIN:  No rashes no nodules NEURO:  Cranial nerves II through XII grossly intact, motor grossly intact throughout PSYCH:  Cognitively intact, oriented to person place and time  ASSESSMENT/PLAN:    Resistant hypertension Blood pressure is now much better controlled.  In fact, his blood pressures are running low.  He had some dizziness today and his blood pressure is only 101.  We will stop his HCTZ 12.5 mg.  Renal function is declining and he is becoming increasingly hypercalcemic.  Repeat a BMP in 1 month.  Continue amlodipine, hydralazine, irbesartan/HCTZ, metoprolol, and spironolactone.  Continue with his regular exercise.  OSA on CPAP Continue CPAP.  Mixed hyperlipidemia Lipids are very well-controlled on rosuvastatin.  Continue with this and regular exercise.  Obstructive hypertrophic cardiomyopathy (HCC) He is stable and follows up in HCM clinic.  Continue metoprolol and mevacamptan.  ASD (atrial septal defect) Secundum ASD.  Planned for ASD closure next month with Dr. Excell Seltzer.  Hyperglycemia Glucose was elevated on his last labs.  This was not fasting.  He is worried about DM.  Will check Hgb A1c.  Plan: -  Screening for Secondary Hypertension:     11/07/2021   11:53 AM  Causes  Drugs/Herbals Screened     - Comments not salt conscious, minimal EtOH, minimal caffeine  Renovascular HTN Screened     - Comments renal Dopplers negative  Sleep Apnea Screened     - Comments uses CPAP  Thyroid Disease Screened     - Comments will send TSH from PCP    Relevant Labs/Studies:    Latest Ref Rng & Units 07/04/2022    9:47 AM 12/28/2020    3:55 AM 12/21/2020    1:55 PM  Basic Labs  Sodium 134 - 144 mmol/L 139  139  142   Potassium 3.5 - 5.2 mmol/L 4.1  3.5  3.5   Creatinine 0.76 - 1.27 mg/dL 0.98  1.19  1.47           Latest Ref Rng & Units 07/30/2021   12:03  PM  Renin/Aldosterone   Aldosterone 0.0 - 30.0 ng/dL 6.7   Renin 8.295 - 6.213 ng/mL/hr 0.290   Aldos/Renin Ratio 0.0 - 30.0 23.1              07/02/2021   12:40 PM  Renovascular   Renal Artery [place] Completed Yes     COVID-19 Education: The signs and symptoms of COVID-19 were discussed with the patient and how to seek care for testing (follow up with PCP or arrange E-visit).  The importance of social distancing was discussed today.  Time:   Today, I have spent 22 minutes with the patient with telehealth technology discussing the above problems.    Disposition:    FU with  Kiara Keep C. Duke Salvia, MD, [facility] in 3 months in virtual visit.  Medication Adjustments/Labs and Tests Ordered: Current medicines are reviewed at length with the patient today.  Concerns regarding medicines are outlined above.    No orders of the defined types were placed in this encounter.  No orders of the defined types were placed in this encounter.  I,Mathew Stumpf,acting as a Neurosurgeon for Chilton Si, MD.,have documented all relevant documentation on the behalf of Chilton Si, MD,as directed by  Chilton Si, MD while in the presence of Chilton Si, MD.  I, Eloise Picone C. Duke Salvia, MD have reviewed all documentation for this visit.  The documentation of the exam, diagnosis, procedures, and orders on 07/10/2022 are all accurate and complete.  Signed, Chilton Si, MD  07/10/2022 1:09 PM    [facility]

## 2022-07-09 ENCOUNTER — Telehealth: Payer: Self-pay

## 2022-07-09 NOTE — Telephone Encounter (Signed)
The patient agreed to ASD closure 07/24/2022 at 0930.  Will send instructions via MyChart.

## 2022-07-10 ENCOUNTER — Encounter (HOSPITAL_BASED_OUTPATIENT_CLINIC_OR_DEPARTMENT_OTHER): Payer: Self-pay | Admitting: Cardiovascular Disease

## 2022-07-10 ENCOUNTER — Telehealth (INDEPENDENT_AMBULATORY_CARE_PROVIDER_SITE_OTHER): Payer: PPO | Admitting: Cardiovascular Disease

## 2022-07-10 VITALS — BP 101/61 | HR 66 | Ht 68.0 in | Wt 206.0 lb

## 2022-07-10 DIAGNOSIS — I1A0 Resistant hypertension: Secondary | ICD-10-CM

## 2022-07-10 DIAGNOSIS — G4733 Obstructive sleep apnea (adult) (pediatric): Secondary | ICD-10-CM

## 2022-07-10 DIAGNOSIS — Q211 Atrial septal defect, unspecified: Secondary | ICD-10-CM

## 2022-07-10 DIAGNOSIS — I421 Obstructive hypertrophic cardiomyopathy: Secondary | ICD-10-CM

## 2022-07-10 DIAGNOSIS — E782 Mixed hyperlipidemia: Secondary | ICD-10-CM

## 2022-07-10 DIAGNOSIS — R739 Hyperglycemia, unspecified: Secondary | ICD-10-CM | POA: Insufficient documentation

## 2022-07-10 NOTE — Assessment & Plan Note (Signed)
He is stable and follows up in HCM clinic.  Continue metoprolol and mevacamptan.

## 2022-07-10 NOTE — Assessment & Plan Note (Signed)
Lipids are very well-controlled on rosuvastatin.  Continue with this and regular exercise.

## 2022-07-10 NOTE — Addendum Note (Signed)
Addended by: Gunnar Fusi A on: 07/10/2022 03:29 PM   Modules accepted: Orders

## 2022-07-10 NOTE — Assessment & Plan Note (Addendum)
Blood pressure is now much better controlled.  In fact, his blood pressures are running low.  He had some dizziness today and his blood pressure is only 101.  We will stop his HCTZ 12.5 mg.  Renal function is declining and he is becoming increasingly hypercalcemic.  Repeat a BMP in 1 month.  Continue amlodipine, hydralazine, irbesartan/HCTZ, metoprolol, and spironolactone.  Continue with his regular exercise.

## 2022-07-10 NOTE — Assessment & Plan Note (Signed)
Glucose was elevated on his last labs.  This was not fasting.  He is worried about DM.  Will check Hgb A1c.

## 2022-07-10 NOTE — Assessment & Plan Note (Signed)
Secundum ASD.  Planned for ASD closure next month with Dr. Excell Seltzer.

## 2022-07-10 NOTE — Telephone Encounter (Signed)
Discussed with Dr. Excell Seltzer. The patient will NOT start Plavix 5 days prior to procedure. He will be loaded with Plavix 300 mg DOS.  Instructions sent to patient via MyChart.

## 2022-07-10 NOTE — Assessment & Plan Note (Signed)
Continue CPAP.  

## 2022-07-10 NOTE — Patient Instructions (Signed)
Medication Instructions:  STOP HYDROCHLOROTHIAZIDE, CONTINUE IRBESARTAN HCT   Labwork: BMET/A1C 1 MONTH   Testing/Procedures: NONE   Follow-Up: 11/25/2022 11:30 AM WITH DR [facility] VISIT   If you need a refill on your cardiac medications before your next appointment, please call your pharmacy.

## 2022-07-18 ENCOUNTER — Other Ambulatory Visit (HOSPITAL_BASED_OUTPATIENT_CLINIC_OR_DEPARTMENT_OTHER): Payer: Self-pay

## 2022-07-23 ENCOUNTER — Telehealth: Payer: Self-pay | Admitting: *Deleted

## 2022-07-23 NOTE — Telephone Encounter (Signed)
Atrial Septal Defect Closure at [facility] for: Thursday Jul 24, 2022 9:30 AM Arrival time [facility] Main Entrance A at: 7:30 AM  Nothing to eat after midnight prior to procedure, clear liquids until 5 AM day of procedure.  Medication instructions: -Hold:  Spironolactone/Irbesartan/HCTZ-AM of procedure -Other usual morning medications can be taken with sips of water including aspirin 81 mg.  Confirmed patient has responsible adult to drive home post procedure and be with patient first 24 hours after arriving home.  Plan to go home the same day, you will only stay overnight if medically necessary.  Reviewed procedure instructions with patient.

## 2022-07-24 ENCOUNTER — Other Ambulatory Visit: Payer: Self-pay

## 2022-07-24 ENCOUNTER — Other Ambulatory Visit (HOSPITAL_COMMUNITY): Payer: Self-pay

## 2022-07-24 ENCOUNTER — Ambulatory Visit (HOSPITAL_BASED_OUTPATIENT_CLINIC_OR_DEPARTMENT_OTHER): Payer: PPO

## 2022-07-24 ENCOUNTER — Encounter (HOSPITAL_COMMUNITY)
Admission: RE | Disposition: A | Discharge status: Home / Self Care | Payer: Self-pay | Source: Ambulatory Visit | Attending: Cardiovascular Disease

## 2022-07-24 ENCOUNTER — Ambulatory Visit (HOSPITAL_COMMUNITY)
Admission: RE | Admit: 2022-07-24 | Discharge: 2022-07-24 | Disposition: A | Discharge status: Home / Self Care | Payer: PPO | Source: Ambulatory Visit | Attending: Cardiovascular Disease | Admitting: Cardiovascular Disease

## 2022-07-24 DIAGNOSIS — G4733 Obstructive sleep apnea (adult) (pediatric): Secondary | ICD-10-CM | POA: Insufficient documentation

## 2022-07-24 DIAGNOSIS — I119 Hypertensive heart disease without heart failure: Secondary | ICD-10-CM | POA: Insufficient documentation

## 2022-07-24 DIAGNOSIS — I2510 Atherosclerotic heart disease of native coronary artery without angina pectoris: Secondary | ICD-10-CM | POA: Diagnosis not present

## 2022-07-24 DIAGNOSIS — Z8249 Family history of ischemic heart disease and other diseases of the circulatory system: Secondary | ICD-10-CM | POA: Diagnosis not present

## 2022-07-24 DIAGNOSIS — Q211 Atrial septal defect, unspecified: Secondary | ICD-10-CM

## 2022-07-24 DIAGNOSIS — I358 Other nonrheumatic aortic valve disorders: Secondary | ICD-10-CM | POA: Insufficient documentation

## 2022-07-24 DIAGNOSIS — Q2111 Secundum atrial septal defect: Secondary | ICD-10-CM | POA: Diagnosis not present

## 2022-07-24 HISTORY — PX: ATRIAL SEPTAL DEFECT(ASD) CLOSURE: CATH118299

## 2022-07-24 LAB — ECHOCARDIOGRAM LIMITED
Height: 68 in
Weight: 3296 oz

## 2022-07-24 LAB — POCT ACTIVATED CLOTTING TIME: Activated Clotting Time: 255 seconds

## 2022-07-24 SURGERY — ATRIAL SEPTAL DEFECT (ASD) CLOSURE
Anesthesia: LOCAL

## 2022-07-24 MED ORDER — CLOPIDOGREL BISULFATE 75 MG PO TABS: 75.0000 mg | ORAL_TABLET | Freq: Every day | ORAL | Status: DC

## 2022-07-24 MED ORDER — FENTANYL CITRATE (PF) 100 MCG/2ML IJ SOLN
INTRAMUSCULAR | Status: DC | PRN
  Administered 2022-07-24: 50 ug via INTRAVENOUS
  Administered 2022-07-24: 25 ug via INTRAVENOUS

## 2022-07-24 MED ORDER — LABETALOL HCL 5 MG/ML IV SOLN: 10.0000 mg | INTRAVENOUS | Status: DC | PRN

## 2022-07-24 MED ORDER — MIDAZOLAM HCL 2 MG/2ML IJ SOLN
INTRAMUSCULAR | Status: AC
  Filled 2022-07-24: qty 2

## 2022-07-24 MED ORDER — SODIUM CHLORIDE 0.9 % WEIGHT BASED INFUSION
3.0000 mL/kg/h | INTRAVENOUS | Status: AC
  Administered 2022-07-24: 3 mL/kg/h via INTRAVENOUS

## 2022-07-24 MED ORDER — ONDANSETRON HCL 4 MG/2ML IJ SOLN: 4.0000 mg | Freq: Four times a day (QID) | INTRAMUSCULAR | Status: DC | PRN

## 2022-07-24 MED ORDER — ASPIRIN 81 MG PO CHEW: 81.0000 mg | CHEWABLE_TABLET | ORAL | Status: DC

## 2022-07-24 MED ORDER — SODIUM CHLORIDE 0.9% FLUSH: 3.0000 mL | INTRAVENOUS | Status: DC | PRN

## 2022-07-24 MED ORDER — HYDRALAZINE HCL 20 MG/ML IJ SOLN: 10.0000 mg | INTRAMUSCULAR | Status: DC | PRN

## 2022-07-24 MED ORDER — SODIUM CHLORIDE 0.9 % IV SOLN: 250.0000 mL | INTRAVENOUS | Status: DC | PRN

## 2022-07-24 MED ORDER — SODIUM CHLORIDE 0.9% FLUSH: 3.0000 mL | Freq: Two times a day (BID) | INTRAVENOUS | Status: DC

## 2022-07-24 MED ORDER — LIDOCAINE HCL (PF) 1 % IJ SOLN
INTRAMUSCULAR | Status: DC | PRN
  Administered 2022-07-24: 10 mL

## 2022-07-24 MED ORDER — MIDAZOLAM HCL 2 MG/2ML IJ SOLN
INTRAMUSCULAR | Status: DC | PRN
  Administered 2022-07-24: 1 mg via INTRAVENOUS
  Administered 2022-07-24: 2 mg via INTRAVENOUS

## 2022-07-24 MED ORDER — HEPARIN SODIUM (PORCINE) 1000 UNIT/ML IJ SOLN
INTRAMUSCULAR | Status: DC | PRN
  Administered 2022-07-24: 8000 [IU] via INTRAVENOUS

## 2022-07-24 MED ORDER — CEFAZOLIN SODIUM-DEXTROSE 2-4 GM/100ML-% IV SOLN: 2.0000 g | INTRAVENOUS | Status: DC

## 2022-07-24 MED ORDER — CEFAZOLIN SODIUM-DEXTROSE 2-4 GM/100ML-% IV SOLN
INTRAVENOUS | Status: AC
  Administered 2022-07-24: 2 g
  Filled 2022-07-24: qty 100

## 2022-07-24 MED ORDER — CLOPIDOGREL BISULFATE 75 MG PO TABS
300.0000 mg | ORAL_TABLET | ORAL | Status: AC
  Administered 2022-07-24: 300 mg via ORAL
  Filled 2022-07-24: qty 4

## 2022-07-24 MED ORDER — FENTANYL CITRATE (PF) 100 MCG/2ML IJ SOLN
INTRAMUSCULAR | Status: AC
  Filled 2022-07-24: qty 2

## 2022-07-24 MED ORDER — HEPARIN SODIUM (PORCINE) 1000 UNIT/ML IJ SOLN
INTRAMUSCULAR | Status: AC
  Filled 2022-07-24: qty 10

## 2022-07-24 MED ORDER — ACETAMINOPHEN 325 MG PO TABS: 650.0000 mg | ORAL_TABLET | ORAL | Status: DC | PRN

## 2022-07-24 MED ORDER — SODIUM CHLORIDE 0.9 % WEIGHT BASED INFUSION: 1.0000 mL/kg/h | INTRAVENOUS | Status: DC

## 2022-07-24 MED ORDER — LIDOCAINE HCL (PF) 1 % IJ SOLN
INTRAMUSCULAR | Status: AC
  Filled 2022-07-24: qty 30

## 2022-07-24 MED ORDER — DIAZEPAM 5 MG PO TABS: 5.0000 mg | ORAL_TABLET | Freq: Three times a day (TID) | ORAL | Status: DC | PRN

## 2022-07-24 MED ORDER — HEPARIN (PORCINE) IN NACL 1000-0.9 UT/500ML-% IV SOLN
INTRAVENOUS | Status: DC | PRN
  Administered 2022-07-24 (×3): 500 mL

## 2022-07-24 MED ORDER — CLOPIDOGREL BISULFATE 75 MG PO TABS
75.0000 mg | ORAL_TABLET | Freq: Every day | ORAL | 5 refills | Status: DC
  Filled 2022-07-24: qty 30, 30d supply, fill #0

## 2022-07-24 SURGICAL SUPPLY — 22 items
BALLN SIZING AMPLATZER 24 (BALLOONS) ×1
BALLOON SIZING AMPLATZER 24 (BALLOONS) IMPLANT
BALN SZ 70X4.5 7FR 3 LUM (BALLOONS) ×1
CATH ACUNAV 8FR 90CM (CATHETERS) IMPLANT
CATH SUPER TORQUE PLUS 6F MPA1 (CATHETERS) IMPLANT
CLOSURE PERCLOSE PROSTYLE (VASCULAR PRODUCTS) IMPLANT
COVER SWIFTLINK CONNECTOR (BAG) IMPLANT
GUIDEWIRE AMPLATZER 1.5JX260 (WIRE) IMPLANT
KIT HEART LEFT (KITS) ×1 IMPLANT
KIT MICROPUNCTURE NIT STIFF (SHEATH) IMPLANT
OCCLUDER AMPLATZER SEPTAL 18MM (Prosthesis & Implant Heart) IMPLANT
PACK CARDIAC CATHETERIZATION (CUSTOM PROCEDURE TRAY) ×1 IMPLANT
PROTECTION STATION PRESSURIZED (MISCELLANEOUS) ×1
SHEATH INTROD W/O MIN 9FR 25CM (SHEATH) IMPLANT
SHEATH PINNACLE 11FRX10 (SHEATH) IMPLANT
SHEATH PINNACLE 8F 10CM (SHEATH) IMPLANT
STATION PROTECTION PRESSURIZED (MISCELLANEOUS) IMPLANT
SYS DELIVER AMP TREVISIO 8FR (SHEATH) ×1
SYSTEM DELIVER AMP TREVIS 8FR (SHEATH) IMPLANT
TRANSDUCER W/STOPCOCK (MISCELLANEOUS) ×1 IMPLANT
TUBING CIL FLEX 10 FLL-RA (TUBING) ×1 IMPLANT
WIRE EMERALD 3MM-J .035X150CM (WIRE) IMPLANT

## 2022-07-24 NOTE — Progress Notes (Signed)
Echocardiogram 2D Echocardiogram has been performed.  Lucendia Herrlich 07/24/2022, 2:00 PM

## 2022-07-24 NOTE — Interval H&P Note (Signed)
History and Physical Interval Note:  07/24/2022 10:48 AM  Alan Spencer  has presented today for surgery, with the diagnosis of asd.  The various methods of treatment have been discussed with the patient and family. After consideration of risks, benefits and other options for treatment, the patient has consented to  Procedure(s): ATRIAL SEPTAL DEFECT(ASD) CLOSURE (N/A) as a surgical intervention.  The patient's history has been reviewed, patient examined, no change in status, stable for surgery.  I have reviewed the patient's chart and labs.  Questions were answered to the patient's satisfaction.     Tonny Bollman

## 2022-07-24 NOTE — Progress Notes (Signed)
Pt ambulated to and from bathroom to void with no signs of oozing from right groin site  

## 2022-07-25 ENCOUNTER — Encounter (HOSPITAL_COMMUNITY): Payer: Self-pay | Admitting: Cardiovascular Disease

## 2022-07-28 ENCOUNTER — Other Ambulatory Visit: Payer: Self-pay

## 2022-07-28 ENCOUNTER — Other Ambulatory Visit (HOSPITAL_COMMUNITY): Payer: Self-pay

## 2022-07-29 ENCOUNTER — Encounter: Payer: Self-pay | Admitting: Internal Medicine

## 2022-07-30 ENCOUNTER — Other Ambulatory Visit (HOSPITAL_COMMUNITY): Payer: Self-pay

## 2022-08-03 ENCOUNTER — Other Ambulatory Visit (HOSPITAL_COMMUNITY): Payer: Self-pay

## 2022-08-03 DIAGNOSIS — G4733 Obstructive sleep apnea (adult) (pediatric): Secondary | ICD-10-CM | POA: Diagnosis not present

## 2022-08-04 ENCOUNTER — Other Ambulatory Visit (HOSPITAL_COMMUNITY): Payer: Self-pay

## 2022-08-04 MED ORDER — ROSUVASTATIN CALCIUM 20 MG PO TABS
20.0000 mg | ORAL_TABLET | Freq: Every day | ORAL | 3 refills | Status: DC
  Filled 2022-08-04: qty 90, 90d supply, fill #0
  Filled 2022-08-23 – 2022-10-29 (×3): qty 90, 90d supply, fill #1
  Filled 2022-12-13 – 2023-01-26 (×2): qty 90, 90d supply, fill #2
  Filled 2023-04-26: qty 90, 90d supply, fill #3

## 2022-08-05 ENCOUNTER — Other Ambulatory Visit (HOSPITAL_COMMUNITY): Payer: Self-pay

## 2022-08-05 ENCOUNTER — Other Ambulatory Visit: Payer: Self-pay

## 2022-08-05 ENCOUNTER — Telehealth: Payer: Self-pay | Admitting: Internal Medicine

## 2022-08-05 DIAGNOSIS — R42 Dizziness and giddiness: Secondary | ICD-10-CM

## 2022-08-05 DIAGNOSIS — I4891 Unspecified atrial fibrillation: Secondary | ICD-10-CM

## 2022-08-05 MED ORDER — ELIQUIS 5 MG PO TABS
5.0000 mg | ORAL_TABLET | Freq: Two times a day (BID) | ORAL | 3 refills | Status: DC
  Filled 2022-08-05: qty 60, 30d supply, fill #0
  Filled 2022-08-23 – 2022-08-28 (×2): qty 60, 30d supply, fill #1
  Filled 2022-09-16 – 2022-09-29 (×2): qty 60, 30d supply, fill #2
  Filled 2022-10-29: qty 60, 30d supply, fill #3

## 2022-08-05 NOTE — Telephone Encounter (Signed)
Dr. Silvano Rusk office called requesting to speak to Dr. Izora Ribas in regards to EKG results they are faxing over shortly.  They state that Dr. Eloise Harman would like to speak with Dr. Izora Ribas personally and gave a direct cell #  [phone]

## 2022-08-05 NOTE — Telephone Encounter (Signed)
Agree. If starting DOAC can stop all antiplatelet Rx. If this is related to device closure, it oftentimes settles down over 3-4 months.

## 2022-08-05 NOTE — Telephone Encounter (Signed)
Called Patient - he has new PAF; they will repeat send the device, slight light headedness - will discuss with Dr. Excell Seltzer: would favor only DOAC as opposed to triple therapy - will send 1 week non live ziopatch for AF - low threshold to see back sooner for DCCV after 21 days for DOAC    Riley Lam, MD Cardiologist [facility]  [address], [address] [place], [place] [address] [phone]  11:48 AM

## 2022-08-08 ENCOUNTER — Ambulatory Visit: Payer: PPO | Attending: Internal Medicine

## 2022-08-08 DIAGNOSIS — L309 Dermatitis, unspecified: Secondary | ICD-10-CM | POA: Diagnosis not present

## 2022-08-08 DIAGNOSIS — I4891 Unspecified atrial fibrillation: Secondary | ICD-10-CM

## 2022-08-08 DIAGNOSIS — R42 Dizziness and giddiness: Secondary | ICD-10-CM

## 2022-08-08 NOTE — Progress Notes (Unsigned)
Enrolled patient for a 7 day Zio XT monitor to be mailed to patients home.  

## 2022-08-08 NOTE — Addendum Note (Signed)
Addended by: Macie Burows on: 08/08/2022 01:46 PM   Modules accepted: Orders

## 2022-08-13 DIAGNOSIS — R42 Dizziness and giddiness: Secondary | ICD-10-CM

## 2022-08-13 DIAGNOSIS — I4891 Unspecified atrial fibrillation: Secondary | ICD-10-CM

## 2022-08-15 ENCOUNTER — Ambulatory Visit (HOSPITAL_COMMUNITY): Payer: PPO | Attending: Internal Medicine

## 2022-08-15 DIAGNOSIS — I421 Obstructive hypertrophic cardiomyopathy: Secondary | ICD-10-CM | POA: Diagnosis not present

## 2022-08-15 LAB — ECHOCARDIOGRAM LIMITED
Area-P 1/2: 4.02 cm2
S' Lateral: 2.4 cm

## 2022-08-18 ENCOUNTER — Encounter: Payer: Self-pay | Admitting: Internal Medicine

## 2022-08-18 ENCOUNTER — Other Ambulatory Visit: Payer: Self-pay

## 2022-08-18 DIAGNOSIS — I421 Obstructive hypertrophic cardiomyopathy: Secondary | ICD-10-CM

## 2022-08-18 MED ORDER — MAVACAMTEN 5 MG PO CAPS: 5.0000 mg | ORAL_CAPSULE | Freq: Every day | ORAL | 2 refills | Status: DC

## 2022-08-18 NOTE — Telephone Encounter (Signed)
Mavacamten 5 mg PO QD refilled #35 with 2 refills.  Sent to [facility].  Camzyos PSF updated.

## 2022-08-21 ENCOUNTER — Other Ambulatory Visit (HOSPITAL_COMMUNITY): Payer: PPO

## 2022-08-21 NOTE — Progress Notes (Unsigned)
Virtual Visit via Telephone Note   Because of Bliss Fessel Vavra's co-morbid illnesses, he is at least at moderate risk for complications without adequate follow up.  This format is felt to be most appropriate for this patient at this time.  The patient did not have access to video technology/had technical difficulties with video requiring transitioning to audio format only (telephone).  All issues noted in this document were discussed and addressed.  No physical exam could be performed with this format.  Please refer to the patient's chart for his consent to telehealth for [facility].   Date:  08/26/2022   ID:  Alan Spencer, DOB [birth year], MRN [id number] The patient was identified using 2 identifiers.  Patient Location: Home Provider Location: Office/Clinic  PCP:  Garlan Fillers, MD   [facility] Providers Cardiologist:  Nanetta Batty, MD {  Evaluation Performed:  Follow-Up Visit  Chief Complaint:  1 month s/p ASD closure  History of Present Illness:    Alan Spencer is a 67 y.o. male with a history of hypertrophic cardiomyopathy, hypertension, hyperparathyroidism, hyperlipidemia, OSA on CPAP, and PFO s/p closure with Dr. Excell Seltzer is being seen today for one month follow up.   Alan Spencer has been followed by Dr. Allyson Sabal for his cardiology care. Echocardiogram from 04/2021 showed hypertrophic cardiomyopathy with an EF at 70-75%, moderate hypertrophy of the basal septum (1.45cm), peak gradient at 1.6 m/s which increased to 3.2 m/s with valsalva. He was subsequently referred to Dr. Izora Ribas and underwent cMRI 08/2021 felt to be consistent with HCM with no LGE. He was started on mavacamten for treatment with improvement. 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 Excell Seltzer for closure. At that time he reported increasing SOB over the last several years.    He underwent successful transcatheter closure of a secundum  atrial septal defect using an 18 mm Amplatzer septal occluder device under fluoroscopic and intracardiac echo guidance. Post procedure limited echo performed showed stable occluder with no residual shunting. Plan was for DAPT with ASA and Plavix x 6 months along with SBE dental prophylaxis.    He was then seen by his PCP 5/21 and had an EKG that showed new onset AF. His DAPT was stopped and he was started on Eliquis 5mg  BID. 3-day ZIO showed an average HR around 51bpm with occasional pausing and difficulty discerning atrial activity.   Today he reports that he has been doing well from a post procedure standpoint however reports that he has been having increased frequency of atrial fibrillation over the last week. He wears what sounds like a Kardia watch and monitors this frequently. Rates reported to be within normal limits. He denies SOB, LE edema, dizziness, chest pain, or orthopnea. He does have palpitations. On chart review, there would be a low threshold for cardioversion due to [facility]. He is also having issues with specialty pharmacy filling his mavacamten in a timely manner. He is having to call monthly for this and each time the process takes several hours to speak with someone on the phone.   Past Medical History:  Diagnosis Date   Anxiety    Essential hypertension, benign    Excessive daytime sleepiness 07/03/2015   Family history of colon cancer    HNP (herniated nucleus pulposus), lumbar    L4-L5   Mixed hyperlipidemia    Obesity (BMI 30-39.9) 01/10/2018   Obstructive hypertrophic cardiomyopathy (HCC) 11/07/2021  Rectal bleeding    Resistant hypertension    Essential hypertension   Sleep apnea    uses CPAP   Past Surgical History:  Procedure Laterality Date   ATRIAL SEPTAL DEFECT(ASD) CLOSURE N/A 07/24/2022   Procedure: ATRIAL SEPTAL DEFECT(ASD) CLOSURE;  Surgeon: Tonny Bollman, MD;  Location[facility] INVASIVE CV LAB;  Service: Cardiovascular;  Laterality: N/A;   broken left elbow      NASAL SEPTUM SURGERY  2022   PARATHYROIDECTOMY N/A 12/27/2020   Procedure: NECK EXPLORATION WITH BIOPSY OF PARATHYROID GLAND x3;  Surgeon: Darnell Level, MD;  Location: [facility] ORS;  Service: General;  Laterality: N/A;   TEE WITHOUT CARDIOVERSION N/A 06/13/2022   Procedure: TRANSESOPHAGEAL ECHOCARDIOGRAM (TEE);  Surgeon: Christell Constant, MD;  Location: [facility] ENDOSCOPY;  Service: Cardiovascular;  Laterality: N/A;   TONSILLECTOMY       Current Meds  Medication Sig   acetaminophen (TYLENOL) 500 MG tablet Take 500 mg by mouth as needed.   amLODipine (NORVASC) 10 MG tablet Take 1 tablet (10 mg total) by mouth daily. (Patient taking differently: Take 10 mg by mouth every evening.)   apixaban (ELIQUIS) 5 MG TABS tablet Take 1 tablet (5 mg total) by mouth 2 (two) times daily.   bimatoprost (LUMIGAN) 0.01 % SOLN Place 1 drop into both eyes daily as needed (redness/irritation.).   BIOTIN PO Take 1 tablet by mouth every evening. chewable   Coenzyme Q10 (COQ10) 100 MG CAPS Take 100 mg by mouth in the morning.   fluticasone (FLONASE) 50 MCG/ACT nasal spray Place 1 spray into both nostrils daily as needed for allergies.   glucosamine-chondroitin 500-400 MG tablet Take 1 tablet by mouth daily.   hydrALAZINE (APRESOLINE) 25 MG tablet Take 1 tablet (25 mg total) by mouth 3 (three) times daily as needed (for blood pressure above 160).   irbesartan-hydrochlorothiazide (AVALIDE) 300-12.5 MG tablet Take 1 tablet by mouth daily.   mavacamten 5 MG CAPS Take 5 mg by mouth daily.   metoprolol succinate (TOPROL-XL) 100 MG 24 hr tablet Take 1 tablet (100 mg total) by mouth daily. Take with or immediately following a meal.   naproxen sodium (ALEVE) 220 MG tablet Take 440 mg by mouth 2 (two) times daily as needed (pain.).   olopatadine (PATADAY) 0.1 % ophthalmic solution Place 1 drop into both eyes daily as needed for allergies.   omega-3 acid ethyl esters (LOVAZA) 1 g capsule Take 2 g by mouth 2 (two) times daily.    rosuvastatin (CRESTOR) 20 MG tablet Take 1 tablet (20 mg total) by mouth daily.   spironolactone (ALDACTONE) 25 MG tablet Take 1 tablet (25 mg total) by mouth daily.   TRIAMCINOLONE ACETONIDE EX Apply 1 Application topically daily as needed (skin irritation/rash).   [DISCONTINUED] aspirin EC 81 MG tablet Take 1 tablet (81 mg total) by mouth daily. Swallow whole.     Allergies:   Patient has no known allergies.   Social History   Tobacco Use   Smoking status: Never   Smokeless tobacco: Never  Vaping Use   Vaping Use: Never used  Substance Use Topics   Alcohol use: Yes    Comment: occas.   Drug use: No    Family Hx: The patient's family history includes Colon cancer in his paternal grandmother; Hypertension in his mother; Hypertrophic cardiomyopathy in his mother; Kidney failure in his father.  ROS:   Please see the history of present illness.     All other systems reviewed and are negative.  Prior  CV studies:   The following studies were reviewed today:  ASD closure 07/24/22:  Successful transcatheter closure of a secundum atrial septal defect using an 18 mm Amplatzer septal occluder device under fluoroscopic and intracardiac echo guidance   Recommendations: DAPT with aspirin and clopidogrel x 6 months as tolerated SBE prophylaxis when indicated x 6 months Limited 2D echocardiogram prior to hospital discharge, same-day discharge protocol if all criteria met  Limited echo 07/24/22:   1. Atrial septal occluder in place with no residual shunt. EF 50-55% with  abnormal septal moition RV appears normal No pericardial effusion.   2. The aortic valve is tricuspid. There is moderate calcification of the  aortic valve. There is moderate thickening of the aortic valve. Aortic  valve sclerosis/calcification is present, without any evidence of aortic  stenosis.   ZIO 08/24/22:  Patch Wear Time:  2 days and 0 hours (2024-05-29T09:42:27-0400 to 2024-05-31T10:39:35-0400)   Patient had  a min HR of 28 bpm, max HR of 108 bpm, and avg HR of 51 bpm. Predominant underlying rhythm was Sinus Rhythm. First Degree AV Block was present. 5 Pauses occurred, the longest lasting 3.8 secs (16 bpm). Isolated SVEs were rare (<1.0%), and no  SVE Couplets or SVE Triplets were present. Isolated VEs were rare (<1.0%), and no VE Couplets or VE Triplets were present. Difficulty discerning atrial activity making definitive diagnosis difficult to ascertain.  Labs/Other Tests and Data Reviewed:    EKG:  An ECG dated 07/24/22 was personally reviewed today and demonstrated:  NSR with 1st degree AV block  Recent Labs: 07/04/2022: BUN 36; Creatinine, Ser 1.37; Hemoglobin 14.7; Platelets 192; Potassium 4.1; Sodium 139   Recent Lipid Panel No results found for: "CHOL", "TRIG", "HDL", "CHOLHDL", "LDLCALC", "LDLDIRECT"  Wt Readings from Last 3 Encounters:  08/25/22 205 lb 3.2 oz (93.1 kg)  07/24/22 206 lb (93.4 kg)  07/10/22 206 lb (93.4 kg)      Objective:    Vital Signs:  BP 103/65   Pulse 75   Ht 5\' 8"  (1.727 m)   Wt 205 lb 3.2 oz (93.1 kg)   SpO2 97%   BMI 31.20 kg/m    VITAL SIGNS:  reviewed PSYCH:  normal affect  ASSESSMENT & PLAN:    ASD: s/p successful transcatheter closure of a secundum atrial septal defect using an 18 mm Amplatzer septal occluder device under fluoroscopic and intracardiac echo guidance 07/24/22. Post procedure limited echo performed showed stable occluder with no residual shunting. Plan was for DAPT with ASA and Plavix x 6 months (01/2023) along with SBE dental prophylaxis. Will send Amoxicillin 2g to preferred pharmacy.   Paroxsymal atrial fibrillation: Incidentally found on EKG by PCP. Post procedure DAPT was stopped and Eliquis was started 5/21. Seen today via telehealth visit due to patient work schedule. Reports he has been in AF constantly for the last week. Plan to continue Eliquis for at least 3 weeks then schedule DCCV.  Very well may be related to recent ASD  closure. Will attempt to restore NSR and follow with Dr. Izora Ribas in one month. If recurrent AF noted, may consider antiarrythmic therapy or referral to EP for possible ablation.   HCM: Followed closely by Dr. Izora Ribas. Tolerating mavacamten 5mg  although having trouble with speciality pharmacy refilling on time. Spoke with our pharmacy team who placed another authorization which may help.    Informed Consent   Shared Decision Making/Informed Consent The risks (stroke, cardiac arrhythmias rarely resulting in the need for a temporary or permanent  pacemaker, skin irritation or burns and complications associated with conscious sedation including aspiration, arrhythmia, respiratory failure and death), benefits (restoration of normal sinus rhythm) and alternatives of a direct current cardioversion were explained in detail to Alan Spencer and he agrees to proceed.      Time:   Today, I have spent 20 minutes with the patient with telehealth technology discussing the above problems.     Medication Adjustments/Labs and Tests Ordered: Current medicines are reviewed at length with the patient today.  Concerns regarding medicines are outlined above.   Tests Ordered: No orders of the defined types were placed in this encounter.   Medication Changes: No orders of the defined types were placed in this encounter.   Follow Up:  In Person  with Dr. Izora Ribas in 09/2022  Signed, Georgie Chard, NP  08/26/2022 8:50 AM    [facility]

## 2022-08-23 ENCOUNTER — Other Ambulatory Visit (HOSPITAL_COMMUNITY): Payer: Self-pay

## 2022-08-24 DIAGNOSIS — I4891 Unspecified atrial fibrillation: Secondary | ICD-10-CM | POA: Diagnosis not present

## 2022-08-24 DIAGNOSIS — R42 Dizziness and giddiness: Secondary | ICD-10-CM | POA: Diagnosis not present

## 2022-08-25 ENCOUNTER — Ambulatory Visit: Payer: PPO

## 2022-08-25 ENCOUNTER — Telehealth: Payer: Self-pay | Admitting: Pharmacist

## 2022-08-25 VITALS — BP 103/65 | HR 75 | Ht 68.0 in | Wt 205.2 lb

## 2022-08-25 DIAGNOSIS — I421 Obstructive hypertrophic cardiomyopathy: Secondary | ICD-10-CM | POA: Diagnosis not present

## 2022-08-25 DIAGNOSIS — Q211 Atrial septal defect, unspecified: Secondary | ICD-10-CM

## 2022-08-25 DIAGNOSIS — I4891 Unspecified atrial fibrillation: Secondary | ICD-10-CM | POA: Diagnosis not present

## 2022-08-25 DIAGNOSIS — I10 Essential (primary) hypertension: Secondary | ICD-10-CM

## 2022-08-25 NOTE — Patient Instructions (Signed)
Medication Instructions:  Your physician recommends that you continue on your current medications as directed. Please refer to the Current Medication list given to you today.  *If you need a refill on your cardiac medications before your next appointment, please call your pharmacy*   Lab Work: TO BE DONE AT [facility] AL[facility] WITH EKG If you have labs (blood work) drawn today and your tests are completely normal, you will receive your results only by: MyChart Message (if you have MyChart) OR A paper copy in the mail If you have any lab test that is abnormal or we need to change your treatment, we will call you to review the results.   Testing/Procedures:   Dear Alan Spencer  You are scheduled for a Cardioversion on Friday, July 5 with Dr. Ronette Deter.  Please arrive at the [facility] (Main Entrance A) at [facility]: [address] [place], [place] [address] at 8:00 AM (This time is 1 hour(s) before your procedure to ensure your preparation). Free valet parking service is available. You will check in at ADMITTING. The support person will be asked to wait in the waiting room.  It is OK to have someone drop you off and come back when you are ready to be discharged.     DIET:  Nothing to eat or drink after midnight except a sip of water with medications (see medication instructions below)  MEDICATION INSTRUCTIONS:       :1}Continue taking your anticoagulant (blood thinner): Apixaban (Eliquis).  You will need to continue this after your procedure until you are told by your provider that it is safe to stop.    LABS:  Come to the lab at [facility] at [address] between the hours of 8:00 am and 4:30 pm. You do NOT have to be fasting.  FYI:  For your safety, and to allow [place] to monitor your vital signs accurately during the surgery/procedure we request: If you have artificial nails, gel coating, SNS etc, please have those removed prior to your  surgery/procedure. Not having the nail coverings /polish removed may result in cancellation or delay of your surgery/procedure.  You must have a responsible person to drive you home and stay in the waiting area during your procedure. Failure to do so could result in cancellation.  Bring your insurance cards.  *Special Note: Every effort is made to have your procedure done on time. Occasionally there are emergencies that occur at the hospital that may cause delays. Please be patient if a delay does occur.   Follow-Up: At [facility], you and your health needs are our priority.  As part of our continuing mission to provide you with exceptional heart care, we have created designated Provider Care Teams.  These Care Teams include your primary Cardiologist (physician) and Advanced Practice Providers (APPs -  Physician Assistants and Nurse Practitioners) who all work together to provide you with the care you need, when you need it.  We recommend signing up for the patient portal called "MyChart".  Sign up information is provided on this After Visit Summary.  MyChart is used to connect with patients for Virtual Visits (Telemedicine).  Patients are able to view lab/test results, encounter notes, upcoming appointments, etc.  Non-urgent messages can be sent to your provider as well.   To learn more about what you can do with MyChart, go to [email or web address].    Your next appointment:   KEEP SCHEDULED FOLLOW-UP

## 2022-08-25 NOTE — Telephone Encounter (Signed)
Received message from Noreene Larsson that pt is having trouble getting his Camzyos refilled. Looks like his insurance may be requiring new prior authorization. I have submitted this as an expedited request, Key: [id number].

## 2022-08-26 ENCOUNTER — Telehealth: Payer: Self-pay | Admitting: Cardiovascular Disease

## 2022-08-26 ENCOUNTER — Other Ambulatory Visit (HOSPITAL_COMMUNITY): Payer: Self-pay

## 2022-08-26 ENCOUNTER — Other Ambulatory Visit: Payer: Self-pay

## 2022-08-26 MED ORDER — AMOXICILLIN 500 MG PO CAPS
2000.0000 mg | ORAL_CAPSULE | ORAL | 0 refills | Status: DC
  Filled 2022-08-26: qty 8, fill #0
  Filled 2022-08-26: qty 8, 2d supply, fill #0

## 2022-08-26 NOTE — Telephone Encounter (Signed)
I appreciate this. I sent this in while I was thinking about it and was planning to call today with cardioversion information and to discuss SBE with him. Thank you!

## 2022-08-26 NOTE — Telephone Encounter (Signed)
Spoke with pt since I was already following up with him about his Camzyos medication -prior Berkley Harvey has been approved and they are sending him med today. He was confused about amoxicillin as he states this wasn't discussed at his visit yesterday. I relayed that he will require SBE prophylaxis for 6 months after his ASD closure. He has a cleaning coming up in the next month or so and is aware to take his amoxicillin before dental work or cleanings in the next 6 months. He was appreciative for the clarification.

## 2022-08-26 NOTE — Telephone Encounter (Signed)
Pt c/o medication issue:  1. Name of Medication:  Amoxicillin   2. How are you currently taking this medication (dosage and times per day)?   3. Are you having a reaction (difficulty breathing--STAT)?   4. What is your medication issue?   Patient states Alan Spencer called in Amoxicillin yesterday after his appointment. He stats this was not dicussed at all and he assumes it was meant for another patient.

## 2022-08-26 NOTE — Telephone Encounter (Signed)
PA approved through 03/17/23, pt aware.

## 2022-08-27 ENCOUNTER — Other Ambulatory Visit (HOSPITAL_COMMUNITY): Payer: Self-pay

## 2022-08-29 ENCOUNTER — Other Ambulatory Visit: Payer: Self-pay

## 2022-09-03 ENCOUNTER — Telehealth: Payer: Self-pay | Admitting: Internal Medicine

## 2022-09-03 ENCOUNTER — Other Ambulatory Visit (HOSPITAL_COMMUNITY): Payer: Self-pay

## 2022-09-03 DIAGNOSIS — G4733 Obstructive sleep apnea (adult) (pediatric): Secondary | ICD-10-CM | POA: Diagnosis not present

## 2022-09-03 MED ORDER — METOPROLOL SUCCINATE ER 50 MG PO TB24
50.0000 mg | ORAL_TABLET | Freq: Every day | ORAL | 3 refills | Status: DC
  Filled 2022-09-03: qty 90, 90d supply, fill #0

## 2022-09-03 NOTE — Addendum Note (Signed)
Addended by: Macie Burows on: 09/03/2022 05:07 PM   Modules accepted: Orders

## 2022-09-03 NOTE — Telephone Encounter (Signed)
Called Patient- he is now back in rhythm both on our monitor and his Apple Watch.  Fatigue has improved by not resolved.  His Watch cannot pick up some heart rate becase HR < 50 bpm.  Able to play tennis but still fatigued Reviewed monitor results.  Will decrease to metoprolol 50 mg po daily from 100 He has an EKG and labs pending on Friday If in SR will cancel DCCV. If rebound HTN may consider switch to Irbesartan chlorthalidone Patient will message by 09/15/22 if worsening sx or BP   Patient had no further questions.  Riley Lam, MD Cardiologist [facility]  [address] [place], [address] [place], [place] [address] [phone]  12:29 PM

## 2022-09-03 NOTE — Telephone Encounter (Signed)
Called pt to ensure instructions from MD are understood.  Pt will decrease metoprolol succinate to 50 mg.  Will have EKG and lab work at place of employment on Friday. Pt had no questions or concerns.

## 2022-09-04 ENCOUNTER — Other Ambulatory Visit: Payer: Self-pay

## 2022-09-05 DIAGNOSIS — I10 Essential (primary) hypertension: Secondary | ICD-10-CM | POA: Diagnosis not present

## 2022-09-05 LAB — BASIC METABOLIC PANEL
EGFR: 43.5
EGFR: 52.6

## 2022-09-08 ENCOUNTER — Telehealth: Payer: Self-pay

## 2022-09-08 NOTE — Telephone Encounter (Signed)
-----   Message from Christell Constant, MD sent at 09/08/2022  8:54 AM EDT ----- Regarding: Cancel Cardioversion  OSH labs reviewed:  Heart rate still low (new change in metoprolol 50 mg PO Daily In sinus bradycardia- cancel DCCV. Inquire about symptoms and blood pressure; we may need to switch to irbesartan- chlorthalidone - May futher decreased BB based on symptoms when he calls in 09/15/22  Thanks, MAC

## 2022-09-08 NOTE — Telephone Encounter (Signed)
Called and spoke with the patient and gave advisement from Dr. Tiajuana Amass. Patient states that he has been feeling better since decreasing metoprolol. He does still have some lightheadedness and dizziness at times. Blood pressures have been 120s/50s.   Cardioversion has been cancelled.

## 2022-09-16 ENCOUNTER — Other Ambulatory Visit: Payer: Self-pay

## 2022-09-17 ENCOUNTER — Other Ambulatory Visit: Payer: Self-pay

## 2022-09-17 ENCOUNTER — Other Ambulatory Visit (HOSPITAL_COMMUNITY): Payer: Self-pay

## 2022-09-19 ENCOUNTER — Other Ambulatory Visit: Payer: Self-pay

## 2022-09-19 ENCOUNTER — Encounter (HOSPITAL_COMMUNITY): Payer: Self-pay

## 2022-09-19 ENCOUNTER — Ambulatory Visit (HOSPITAL_COMMUNITY): Admit: 2022-09-19 | Payer: PPO | Admitting: Internal Medicine

## 2022-09-19 SURGERY — CARDIOVERSION
Anesthesia: General

## 2022-09-20 ENCOUNTER — Encounter: Payer: Self-pay | Admitting: Internal Medicine

## 2022-09-25 DIAGNOSIS — G4733 Obstructive sleep apnea (adult) (pediatric): Secondary | ICD-10-CM | POA: Diagnosis not present

## 2022-09-26 IMAGING — MR MR CARD MORPHOLOGY WO/W CM
45 of 48 series · 45 of 48 positions shown · IV contrast (Contrast agent)
Comparison: none

CLINICAL DATA: Clinical question of hypertrophic cardiomyopathy
Study assumes HCT of 45 and BSA of 2.12 m2.

EXAM:
CARDIAC MRI
TECHNIQUE: The patient was scanned on a 1.5 Tesla GE magnet. A dedicated
cardiac coil was used. Functional imaging was done using [employer]
sequences. [DATE], and 4 chamber views were done to assess for RWMA's.
Modified Sagingali rule using a short axis stack was used to
calculate an ejection fraction on a dedicated work station using
[employer] software. The patient received 10 cc of Gadavist. After 10
minutes inversion recovery sequences were used to assess for
infiltration and scar tissue.
CONTRAST:  10 cc  of Gadavist

[Series 4: [id number]_bh · axial · 8.0mm · 1.48mm/px · 1 of 16 slices shown]
[im 1/16]
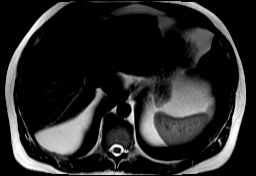

[Series 8: bSSFP · oblique · 8.0mm · 1.61mm/px · 1 of 25 slices shown (1 of 20)]
[im 1/25]
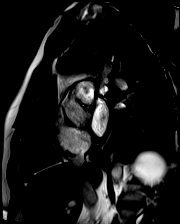

[Series 9: bSSFP · oblique · 8.0mm · 1.61mm/px · 1 of 25 slices shown (2 of 20)]
[im 1/25]
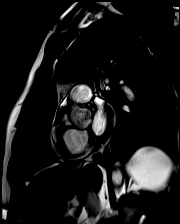

[Series 10: bSSFP · oblique · 8.0mm · 1.61mm/px · 1 of 25 slices shown (3 of 20)]
[im 1/25]
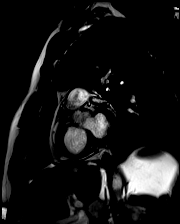

[Series 11: bSSFP · oblique · 8.0mm · 1.61mm/px · 1 of 25 slices shown (4 of 20)]
[im 1/25]
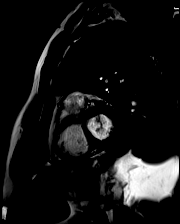

[Series 12: bSSFP · oblique · 8.0mm · 1.61mm/px · 1 of 25 slices shown (5 of 20)]
[im 1/25]
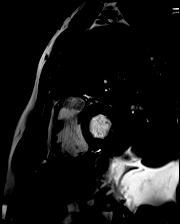

[Series 13: bSSFP · oblique · 8.0mm · 1.61mm/px · 1 of 25 slices shown (6 of 20)]
[im 1/25]
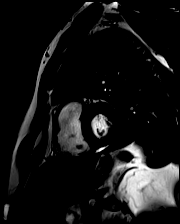

[Series 14: bSSFP · oblique · 8.0mm · 1.61mm/px · 1 of 25 slices shown (7 of 20)]
[im 1/25]
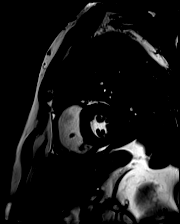

[Series 15: bSSFP · oblique · 8.0mm · 1.61mm/px · 1 of 25 slices shown (8 of 20)]
[im 1/25]
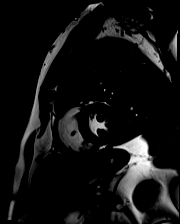

[Series 16: bSSFP · oblique · 8.0mm · 1.61mm/px · 1 of 25 slices shown (9 of 20)]
[im 1/25]
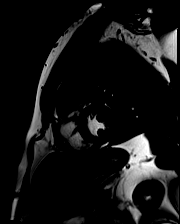

[Series 17: bSSFP · oblique · 8.0mm · 1.61mm/px · 1 of 25 slices shown (10 of 20)]
[im 1/25]
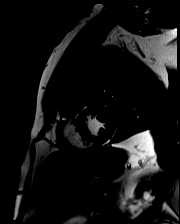

[Series 18: bSSFP · oblique · 8.0mm · 1.61mm/px · 1 of 25 slices shown (11 of 20)]
[im 1/25]
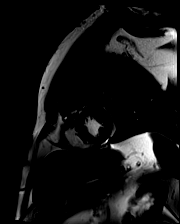

[Series 19: bSSFP · oblique · 8.0mm · 1.61mm/px · 1 of 25 slices shown (12 of 20)]
[im 1/25]
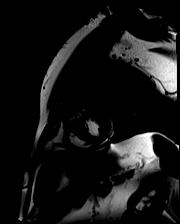

[Series 20: bSSFP · oblique · 8.0mm · 1.61mm/px · 1 of 25 slices shown (13 of 20)]
[im 1/25]
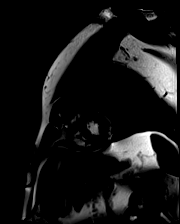

[Series 21: bSSFP · oblique · 8.0mm · 1.61mm/px · 1 of 25 slices shown (14 of 20)]
[im 1/25]
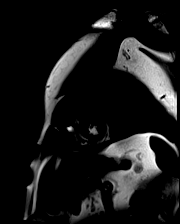

[Series 22: bSSFP · oblique · 8.0mm · 1.61mm/px · 1 of 25 slices shown (15 of 20)]
[im 1/25]
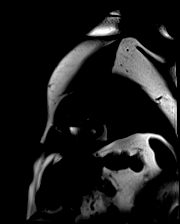

[Series 23: bSSFP · oblique · 8.0mm · 1.61mm/px · 1 of 25 slices shown (16 of 20)]
[im 1/25]
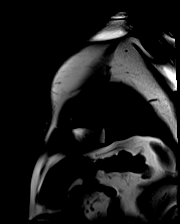

[Series 24: bSSFP · oblique · 8.0mm · 1.61mm/px · 1 of 25 slices shown (17 of 20)]
[im 1/25]
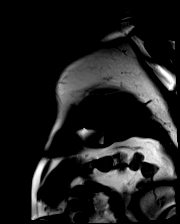

[Series 25: bSSFP · axial · 6.0mm · 1.41mm/px · 1 of 25 slices shown (18 of 20)]
[im 1/25]
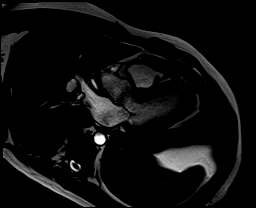

[Series 26: bSSFP · oblique · 6.0mm · 1.41mm/px · 1 of 25 slices shown (19 of 20)]
[im 1/25]
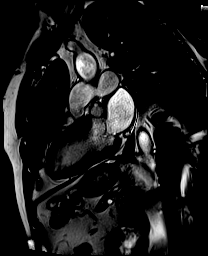

[Series 27: bSSFP · axial · 6.0mm · 1.41mm/px · 1 of 25 slices shown (20 of 20)]
[im 1/25]
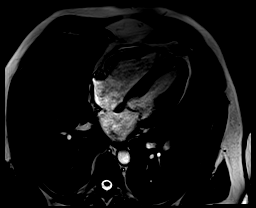

[Series 28: (id)_long_t1 · oblique · 8.0mm · 1.56mm/px · 1 of 24 slices shown]
[im 1/24]
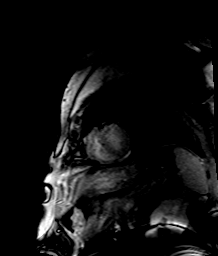

[Series 29: (id)_long_t1_moco · oblique · 8.0mm · 1.56mm/px · 1 of 24 slices shown]
[im 1/24]
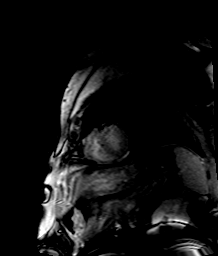

[Series 32: (id)_trufi · oblique · 8.0mm · 2.08mm/px · 1 of 9 slices shown]
[im 1/9]
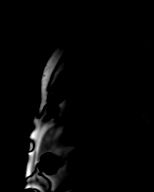

[Series 33: (id)_trufi_moco · oblique · 8.0mm · 2.08mm/px · 1 of 9 slices shown]
[im 1/9]
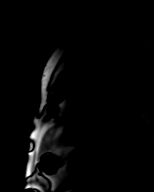

[Series 36: STIR · oblique · 8.0mm · 1.92mm/px · 1 of 16 slices shown]
[im 1/16]
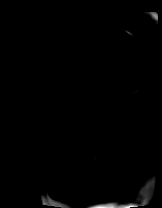

[Series 37: cine_trufi_cs_rt_short axis · oblique · 8.0mm · 1.73mm/px · 1 of 20 slices shown (1 of 16)]
[im 1/20]
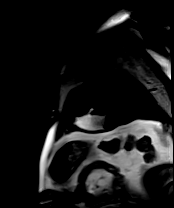

[Series 37: cine_trufi_cs_rt_short axis · oblique · 8.0mm · 1.73mm/px · 1 of 20 slices shown (2 of 16)]
[im 1/20]
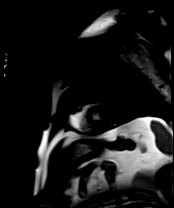

[Series 37: cine_trufi_cs_rt_short axis · oblique · 8.0mm · 1.73mm/px · 1 of 20 slices shown (3 of 16)]
[im 1/20]
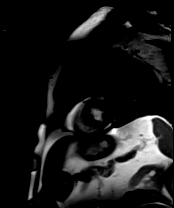

[Series 37: cine_trufi_cs_rt_short axis · oblique · 8.0mm · 1.73mm/px · 1 of 20 slices shown (4 of 16)]
[im 1/20]
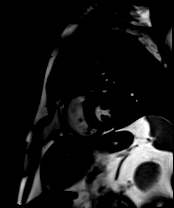

[Series 37: cine_trufi_cs_rt_short axis · oblique · 8.0mm · 1.73mm/px · 1 of 20 slices shown (5 of 16)]
[im 1/20]
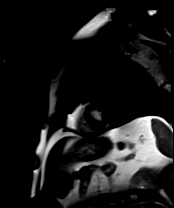

[Series 37: cine_trufi_cs_rt_short axis · oblique · 8.0mm · 1.73mm/px · 1 of 20 slices shown (6 of 16)]
[im 1/20]
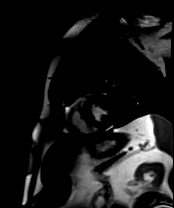

[Series 37: cine_trufi_cs_rt_short axis · oblique · 8.0mm · 1.73mm/px · 1 of 20 slices shown (7 of 16)]
[im 1/20]
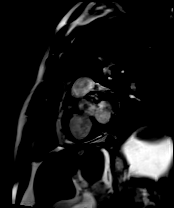

[Series 37: cine_trufi_cs_rt_short axis · oblique · 8.0mm · 1.73mm/px · 1 of 20 slices shown (8 of 16)]
[im 1/20]
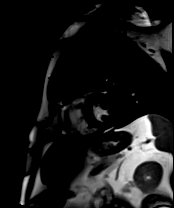

[Series 37: cine_trufi_cs_rt_short axis · oblique · 8.0mm · 1.73mm/px · 1 of 20 slices shown (9 of 16)]
[im 1/20]
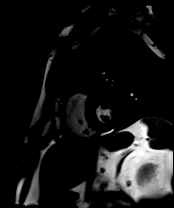

[Series 37: cine_trufi_cs_rt_short axis · oblique · 8.0mm · 1.73mm/px · 1 of 20 slices shown (10 of 16)]
[im 1/20]
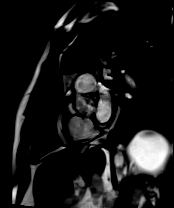

[Series 37: cine_trufi_cs_rt_short axis · oblique · 8.0mm · 1.73mm/px · 1 of 20 slices shown (11 of 16)]
[im 1/20]
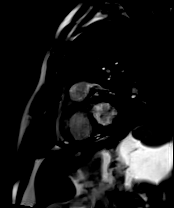

[Series 37: cine_trufi_cs_rt_short axis · oblique · 8.0mm · 1.73mm/px · 1 of 20 slices shown (12 of 16)]
[im 1/20]
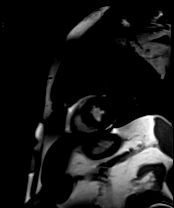

[Series 37: cine_trufi_cs_rt_short axis · oblique · 8.0mm · 1.73mm/px · 1 of 20 slices shown (13 of 16)]
[im 1/20]
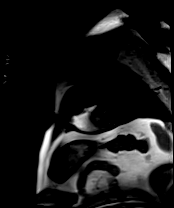

[Series 37: cine_trufi_cs_rt_short axis · oblique · 8.0mm · 1.73mm/px · 1 of 20 slices shown (14 of 16)]
[im 1/20]
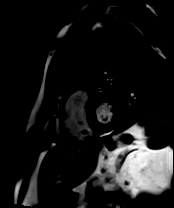

[Series 37: cine_trufi_cs_rt_short axis · oblique · 8.0mm · 1.73mm/px · 1 of 20 slices shown (15 of 16)]
[im 1/20]
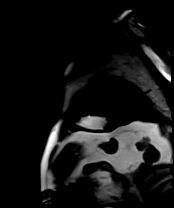

[Series 37: cine_trufi_cs_rt_short axis · oblique · 8.0mm · 1.73mm/px · 1 of 20 slices shown (16 of 16)]
[im 1/20]
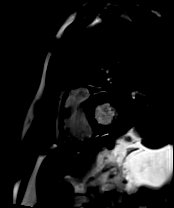

[Series 38: cine_trufi_short axis_cs_2_shot · oblique · 8.0mm · 1.48mm/px · 1 of 25 slices shown (1 of 3)]
[im 1/25]
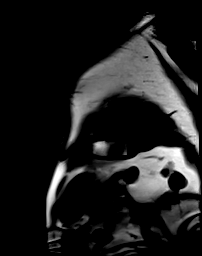

[Series 38: cine_trufi_short axis_cs_2_shot · oblique · 8.0mm · 1.48mm/px · 1 of 25 slices shown (2 of 3)]
[im 1/25]
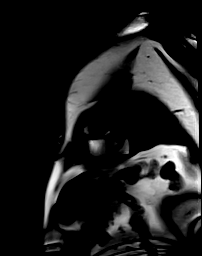

[Series 38: cine_trufi_short axis_cs_2_shot · oblique · 8.0mm · 1.48mm/px · 1 of 25 slices shown (3 of 3)]
[im 1/25]
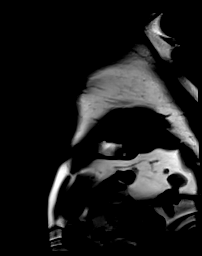

[45 of 48 positions shown; findings below may reference images not displayed]

FINDINGS: 1. Normal left ventricular size, with LVEDD 46 mm, and LVEDVi 46
mL/m2.

Normal left ventricular thickness, with intraventricular septal
thickness of 15 mm, posterior wall thickness of 10 mm, though
myocardial mass index normal for gender at 65 g/m2.

Normal left ventricular systolic function (LVEF =63%). There are no
regional wall motion abnormalities. There is late peaking chordal
systolic motion of the anterior mitral valve (chordal TIGER).

Left ventricular parametric mapping notable for normal ECV signal
and normal T2 signal.

There is no late gadolinium enhancement in the left ventricular
myocardium. Six standard deviation reference used.

2. Normal right ventricular size with RVEDVI 87 mL/m2.

Normal right ventricular thickness.

Normal right ventricular systolic function (RVEF =52%). There are no
regional wall motion abnormalities or aneurysms.

3.  Normal left and right atrial size.

4. Normal size of the aortic root, ascending aorta and pulmonary
artery.

5. Valve assessment:

Aortic Valve: Tri-leaflet aortic valve. Regurgitant fraction 14%,
mean gradient of 1 mm Hg. Mild central aortic regurgitation.

Pulmonic Valve: Regurgitant fraction 2%, mean gradient < 1 mm Hg.
Qualitatively, there is no significant regurgitation.

Tricuspid Valve: Regurgitant fraction 10%. Mild central tricuspid
regurgitation

Mitral Valve: Regurgitant fraction 16%.  Mild mitral regurgitation.

6.  Normal pericardium.  No pericardial effusion.

7. Grossly, no extracardiac findings. Recommended dedicated study if
concerned for non-cardiac pathology.
IMPRESSION: In absence of system disease leading to hypertrophy, no evidence of
infiltrative disease and consistent with hypertrophic
cardiomyopathy.

There is qualitatively turbulent LVOT flow often seen in
hypertrophic cardiomyopathy.

No evidence of scar.

## 2022-09-29 ENCOUNTER — Other Ambulatory Visit: Payer: Self-pay

## 2022-10-03 DIAGNOSIS — G4733 Obstructive sleep apnea (adult) (pediatric): Secondary | ICD-10-CM | POA: Diagnosis not present

## 2022-10-08 ENCOUNTER — Other Ambulatory Visit (HOSPITAL_COMMUNITY): Payer: Self-pay

## 2022-10-08 ENCOUNTER — Ambulatory Visit: Payer: PPO | Attending: Internal Medicine | Admitting: Internal Medicine

## 2022-10-08 ENCOUNTER — Encounter: Payer: Self-pay | Admitting: Internal Medicine

## 2022-10-08 VITALS — BP 120/56 | HR 41 | Ht 68.0 in | Wt 215.0 lb

## 2022-10-08 DIAGNOSIS — I4891 Unspecified atrial fibrillation: Secondary | ICD-10-CM | POA: Diagnosis not present

## 2022-10-08 DIAGNOSIS — I10 Essential (primary) hypertension: Secondary | ICD-10-CM

## 2022-10-08 DIAGNOSIS — I421 Obstructive hypertrophic cardiomyopathy: Secondary | ICD-10-CM | POA: Diagnosis not present

## 2022-10-08 MED ORDER — METOPROLOL SUCCINATE ER 25 MG PO TB24
25.0000 mg | ORAL_TABLET | Freq: Every day | ORAL | 3 refills | Status: DC
  Filled 2022-10-08: qty 90, 90d supply, fill #0

## 2022-10-08 NOTE — Progress Notes (Signed)
Cardiology Office Note:    Date:  10/08/2022   ID:  Alan Spencer, DOB 1956-03-14, MRN [id number]  PCP:  Garlan Fillers, MD   [facility] Providers Cardiologist:  Nanetta Batty, MD     Referring MD: Garlan Fillers, MD   CC: HCM follow up  History of Present Illness:    Alan Spencer is a 67 y.o. male with a hx of HCM and uncontrolled HTN who presents for evaluation 07/30/21. 2023: Since last visit we have tried to down titrate medications that would affect his LVOT gradient.  No change on MRAs.  Unable to come off diuretics for BP.  Was not a candidate for the Radiance trial (diastolic BP). 2024: started on mavacamten with NYHA II symptoms; Found to have ASD with RV dilation without dysfunction.  Had AF post ASE closure.  AF improvd.  We weaned his BB and his fatigue improved.  Patient notes that he is doing well.   Since last visit notes that he is still having some fatigue. Heart rates are low despite decreasing BB. There are no interval hospital/ED visit.   EKG showed Sinus bradycardia  No chest pain or pressure.  No SOB/DOE and no PND/Orthopnea.  No weight gain or leg swelling.  No palpitations or syncope.  No clear correlation between BP elevation and aleve use (prior to tennis matches)   Past Medical History:  Diagnosis Date   Anxiety    Essential hypertension, benign    Excessive daytime sleepiness 07/03/2015   Family history of colon cancer    HNP (herniated nucleus pulposus), lumbar    L4-L5   Mixed hyperlipidemia    Obesity (BMI 30-39.9) 01/10/2018   Obstructive hypertrophic cardiomyopathy (HCC) 11/07/2021   Rectal bleeding    Resistant hypertension    Essential hypertension   Sleep apnea    uses CPAP    Past Surgical History:  Procedure Laterality Date   ATRIAL SEPTAL DEFECT(ASD) CLOSURE N/A 07/24/2022   Procedure: ATRIAL SEPTAL DEFECT(ASD) CLOSURE;  Surgeon: Tonny Bollman, MD;  Location: [facility] INVASIVE CV LAB;  Service: Cardiovascular;   Laterality: N/A;   broken left elbow     NASAL SEPTUM SURGERY  2022   PARATHYROIDECTOMY N/A 12/27/2020   Procedure: NECK EXPLORATION WITH BIOPSY OF PARATHYROID GLAND x3;  Surgeon: Darnell Level, MD;  Location: [facility] ORS;  Service: General;  Laterality: N/A;   TEE WITHOUT CARDIOVERSION N/A 06/13/2022   Procedure: TRANSESOPHAGEAL ECHOCARDIOGRAM (TEE);  Surgeon: Christell Constant, MD;  Location: [facility] ENDOSCOPY;  Service: Cardiovascular;  Laterality: N/A;   TONSILLECTOMY      Current Medications: Current Meds  Medication Sig   acetaminophen (TYLENOL) 500 MG tablet Take 500 mg by mouth as needed.   amLODipine (NORVASC) 10 MG tablet Take 1 tablet (10 mg total) by mouth daily.   amoxicillin (AMOXIL) 500 MG capsule Take 4 capsules (2,000 mg total) by mouth as directed. Take 4 tablets 1 hour prior to dental work, including cleanings.   apixaban (ELIQUIS) 5 MG TABS tablet Take 1 tablet (5 mg total) by mouth 2 (two) times daily.   bimatoprost (LUMIGAN) 0.01 % SOLN Place 1 drop into both eyes daily as needed (redness/irritation.).   BIOTIN PO Take 1 tablet by mouth every evening. chewable   Coenzyme Q10 (COQ10) 100 MG CAPS Take 100 mg by mouth in the morning.   fluticasone (FLONASE) 50 MCG/ACT nasal spray Place 1 spray into both nostrils daily as needed for allergies.   glucosamine-chondroitin 500-400 MG tablet  Take 1 tablet by mouth daily.   hydrALAZINE (APRESOLINE) 25 MG tablet Take 1 tablet (25 mg total) by mouth 3 (three) times daily as needed (for blood pressure above 160).   irbesartan-hydrochlorothiazide (AVALIDE) 300-12.5 MG tablet Take 1 tablet by mouth daily.   mavacamten 5 MG CAPS Take 5 mg by mouth daily.   metoprolol succinate (TOPROL XL) 25 MG 24 hr tablet Take 1 tablet (25 mg total) by mouth daily.   naproxen sodium (ALEVE) 220 MG tablet Take 440 mg by mouth 2 (two) times daily as needed (pain.).   olopatadine (PATADAY) 0.1 % ophthalmic solution Place 1 drop into both eyes daily as  needed for allergies.   omega-3 acid ethyl esters (LOVAZA) 1 g capsule Take 2 g by mouth 2 (two) times daily.   rosuvastatin (CRESTOR) 20 MG tablet Take 1 tablet (20 mg total) by mouth daily.   spironolactone (ALDACTONE) 25 MG tablet Take 1 tablet (25 mg total) by mouth daily.   TRIAMCINOLONE ACETONIDE EX Apply 1 Application topically daily as needed (skin irritation/rash).   [DISCONTINUED] metoprolol succinate (TOPROL-XL) 50 MG 24 hr tablet Take 1 tablet (50 mg total) by mouth daily. Take with or immediately following a meal.     Allergies:   Patient has no known allergies.   Social History   Socioeconomic History   Marital status: Married    Spouse name: Not on file   Number of children: Not on file   Years of education: Not on file   Highest education level: Not on file  Occupational History   Not on file  Tobacco Use   Smoking status: Never   Smokeless tobacco: Never  Vaping Use   Vaping status: Never Used  Substance and Sexual Activity   Alcohol use: Yes    Comment: occas.   Drug use: No   Sexual activity: Not on file  Other Topics Concern   Not on file  Social History Narrative   Not on file   Social Determinants of Health   Financial Resource Strain: Low Risk  (11/07/2021)   Overall Financial Resource Strain (CARDIA)    Difficulty of Paying Living Expenses: Not hard at all  Food Insecurity: No Food Insecurity (11/07/2021)   Hunger Vital Sign    Worried About Running Out of Food in the Last Year: Never true    Ran Out of Food in the Last Year: Never true  Transportation Needs: Unknown (11/07/2021)   PRAPARE - Administrator, Civil Service (Medical): No    Lack of Transportation (Non-Medical): Not on file  Physical Activity: Sufficiently Active (11/07/2021)   Exercise Vital Sign    Days of Exercise per Week: 7 days    Minutes of Exercise per Session: 70 min  Recent Concern: Physical Activity - Insufficiently Active (11/07/2021)   Exercise Vital Sign     Days of Exercise per Week: 4 days    Minutes of Exercise per Session: 30 min  Stress: Not on file  Social Connections: Not on file     Family History: The patient's family history includes Colon cancer in his paternal grandmother; Hypertension in his mother; Hypertrophic cardiomyopathy in his mother; Kidney failure in his father.  ROS:   Please see the history of present illness.     All other systems reviewed and are negative.  EKGs/Labs/Other Studies Reviewed:    The following studies were reviewed today:  EKG:   03/22/21: Sinus bradycardia with 1st heart with LVH  Cardiac  Studies & Procedures   CARDIAC CATHETERIZATION  CARDIAC CATHETERIZATION 07/24/2022  Narrative Successful transcatheter closure of a secundum atrial septal defect using an 18 mm Amplatzer septal occluder device under fluoroscopic and intracardiac echo guidance  Recommendations: DAPT with aspirin and clopidogrel x 6 months as tolerated SBE prophylaxis when indicated x 6 months Limited 2D echocardiogram prior to hospital discharge, same-day discharge protocol if all criteria met     ECHOCARDIOGRAM  ECHOCARDIOGRAM LIMITED 08/15/2022  Narrative ECHOCARDIOGRAM LIMITED REPORT    Patient Name:   Alan Spencer Date of Exam: 08/15/2022 Medical Rec #:  [id number]     Height:       68.0 in Accession #:    [id number]    Weight:       206.0 lb Date of Birth:  [birth year]    BSA:          2.070 m Patient Age:    66 years      BP:           152/80 mmHg Patient Gender: M             HR:           43 bpm. Exam Location:  [address]  Procedure: 2D Echo, Cardiac Doppler and Limited Color Doppler  Indications:    I42.1 HOCM  History:        Patient has prior history of Echocardiogram examinations, most recent 07/24/2022. Hypertrophic Cardiomyopathy, Arrythmias:Atrial Fibrillation, Signs/Symptoms:Dizziness/Lightheadedness; Risk Factors:Sleep Apnea, Hypertension and Dyslipidemia. HOCM- Mevacamten 5 mg, Status post  ASD Closure.  Sonographer:    Farrel Conners RDCS Referring Phys: [facility] A Dreydon Cardenas  IMPRESSIONS   1. No SAM or LVOT gradient. Left ventricular ejection fraction, by estimation, is 65 to 70%. The left ventricle has normal function. The left ventricle has no regional wall motion abnormalities. There is moderate asymmetric left ventricular hypertrophy of the basal-septal segment. 2. Right ventricular systolic function is normal. The right ventricular size is normal. 3. The mitral valve is normal in structure. Trivial mitral valve regurgitation. No evidence of mitral stenosis. 4. The aortic valve is tricuspid. There is mild calcification of the aortic valve. Aortic valve regurgitation is not visualized. No aortic stenosis is present. 5. The inferior vena cava is normal in size with greater than 50% respiratory variability, suggesting right atrial pressure of 3 mmHg.  FINDINGS Left Ventricle: No SAM or LVOT gradient. Left ventricular ejection fraction, by estimation, is 65 to 70%. The left ventricle has normal function. The left ventricle has no regional wall motion abnormalities. The left ventricular internal cavity size was normal in size. There is moderate asymmetric left ventricular hypertrophy of the basal-septal segment.  Right Ventricle: The right ventricular size is normal. No increase in right ventricular wall thickness. Right ventricular systolic function is normal.  Left Atrium: Left atrial size was normal in size.  Right Atrium: Right atrial size was normal in size.  Pericardium: There is no evidence of pericardial effusion.  Mitral Valve: The mitral valve is normal in structure. Trivial mitral valve regurgitation. No evidence of mitral valve stenosis.  Tricuspid Valve: The tricuspid valve is normal in structure. Tricuspid valve regurgitation is trivial. No evidence of tricuspid stenosis.  Aortic Valve: The aortic valve is tricuspid. There is mild calcification of the  aortic valve. Aortic valve regurgitation is not visualized. No aortic stenosis is present.  Pulmonic Valve: The pulmonic valve was normal in structure. Pulmonic valve regurgitation is mild to moderate. No evidence of pulmonic stenosis.  Aorta: The aortic root is normal in size and structure.  Venous: The inferior vena cava is normal in size with greater than 50% respiratory variability, suggesting right atrial pressure of 3 mmHg.  IAS/Shunts: No atrial level shunt detected by color flow Doppler.  LEFT VENTRICLE PLAX 2D LVIDd:         4.10 cm LVIDs:         2.40 cm LV PW:         1.00 cm LV IVS:        1.00 cm LVOT diam:     2.60 cm LV SV:         148 LV SV Index:   72 LVOT Area:     5.31 cm   RIGHT VENTRICLE RVSP:           18.8 mmHg  LEFT ATRIUM         Index       RIGHT ATRIUM LA diam:    4.60 cm 2.22 cm/m  RA Pressure: 3.00 mmHg AORTIC VALVE LVOT Vmax:   114.00 cm/s LVOT Vmean:  75.300 cm/s LVOT VTI:    0.280 m  AORTA Ao Root diam: 3.50 cm Ao Asc diam:  3.90 cm  MITRAL VALVE               TRICUSPID VALVE MV Area (PHT): cm         TR Peak grad:   15.8 mmHg MV Decel Time: 189 msec    TR Vmax:        199.00 cm/s MV E velocity: 91.75 cm/s  Estimated RAP:  3.00 mmHg MV A velocity: 97.35 cm/s  RVSP:           18.8 mmHg MV E/A ratio:  0.94 SHUNTS Systemic VTI:  0.28 m Systemic Diam: 2.60 cm  Arvilla Meres MD Electronically signed by Arvilla Meres MD Signature Date/Time: 08/15/2022/2:53:24 PM    Final   TEE  ECHO TEE 06/13/2022  Narrative TRANSESOPHOGEAL ECHO REPORT    Patient Name:   Alan Spencer Date of Exam: 06/13/2022 Medical Rec #:  [id number]     Height:       68.0 in Accession #:    [id number]    Weight:       206.0 lb Date of Birth:  1955/05/26    BSA:          2.070 m Patient Age:    66 years      BP:           100/50 mmHg Patient Gender: M             HR:           55 bpm. Exam Location:  Inpatient  Procedure: 3D Echo, Transesophageal  Echo, Cardiac Doppler and Color Doppler  Indications:     Q21.1 ASD  History:         Patient has prior history of Echocardiogram examinations, most recent 05/26/2022. Signs/Symptoms:Dyspnea and Shortness of Breath; Risk Factors:Sleep Apnea and Dyslipidemia. ASD. HOCM. Chordal SAM.  Sonographer:     Sheralyn Boatman RDCS Referring Phys:  [id number] [facility] A Izora Ribas Diagnosing Phys: Riley Lam MD  PROCEDURE: After discussion of the risks and benefits of a TEE, an informed consent was obtained from the patient. The transesophogeal probe was passed without difficulty through the esophogus of the patient. Imaged were obtained with the patient in a left lateral decubitus position. Sedation performed by different physician. The patient was monitored while  under deep sedation. Anesthestetic sedation was provided intravenously by Anesthesiology: 155mg  of Propofol. The patient's vital signs; including heart rate, blood pressure, and oxygen saturation; remained stable throughout the procedure. The patient developed no complications during the procedure. Transgastric views not obtained.  IMPRESSIONS   1. Evidence of atrial level shunting detected by color flow Doppler. There is a moderately sized secundum atrial septal defect with predominantly left to right shunting across the atrial septum. Largest dimensions 2.39 cm by 2.22 cm. Smallest rims are anterior (5 mm) and superior (6 mm). Sufficient dimensions for an Amplatzer ASD- 024 device. 2. Chordal SAM with no significant LVOT gradient.. Left ventricular ejection fraction, by estimation, is 65 to 70%. The left ventricle has normal function. There is severe asymmetric left ventricular hypertrophy of the septal segment. 3. Right ventricular systolic function is normal. The right ventricular size is moderately enlarged. 4. No left atrial/left atrial appendage thrombus was detected. 5. The mitral valve is normal in structure. Mild mitral valve  regurgitation. No evidence of mitral stenosis. 6. The aortic valve is tricuspid. Aortic valve regurgitation is not visualized. No aortic stenosis is present.  FINDINGS Left Ventricle: Chordal SAM with no significant LVOT gradient. Left ventricular ejection fraction, by estimation, is 65 to 70%. The left ventricle has normal function. The left ventricular internal cavity size was normal in size. There is severe asymmetric left ventricular hypertrophy of the septal segment.  Right Ventricle: The right ventricular size is moderately enlarged. Right vetricular wall thickness was not well visualized. Right ventricular systolic function is normal.  Left Atrium: Left atrial size was normal in size. No left atrial/left atrial appendage thrombus was detected.  Right Atrium: Right atrial size was normal in size.  Pericardium: There is no evidence of pericardial effusion.  Mitral Valve: The mitral valve is normal in structure. Mild mitral valve regurgitation. No evidence of mitral valve stenosis.  Tricuspid Valve: The tricuspid valve is normal in structure. Tricuspid valve regurgitation is not demonstrated. No evidence of tricuspid stenosis.  Aortic Valve: The aortic valve is tricuspid. Aortic valve regurgitation is not visualized. No aortic stenosis is present.  Pulmonic Valve: The pulmonic valve was not well visualized. Pulmonic valve regurgitation is not visualized. No evidence of pulmonic stenosis.  Aorta: The aortic root and ascending aorta are structurally normal, with no evidence of dilitation.  IAS/Shunts: Evidence of atrial level shunting detected by color flow Doppler. There is a moderately sized secundum atrial septal defect with predominantly left to right shunting across the atrial septum.  Additional Comments: Spectral Doppler performed.  Riley Lam MD Electronically signed by Riley Lam MD Signature Date/Time: 06/13/2022/4:51:57 PM    Final    MONITORS  LONG TERM MONITOR (3-14 DAYS) 08/25/2022  Narrative   Patient had a minimum heart rate of 28 bpm, maximum heart rate of 108 bpm, and average heart rate of 51 bpm.   Predominant underlying rhythm was sinus rhythm.   Five sinus pauses noted; one of which was during the day, longest lasting 3.8 seconds, no device triggering.  Asymptomatic bradycardia without atrial fibrillation.   CT SCANS  CT CORONARY MORPH W/CTA COR W/SCORE 04/26/2021  Addendum 04/26/2021 12:53 PM ADDENDUM REPORT: 04/26/2021 12:51  CLINICAL DATA:  Dyspnea with coronary risk factors  EXAM: Cardiac CTA  MEDICATIONS: Sub lingual nitro. 4 mg and lopressor 100mg   TECHNIQUE: The patient was scanned on a [employer] 192 scanner. Gantry rotation speed was 250 msecs. Collimation was. 6 mm . A 120 kV prospective scan was triggered  in the ascending thoracic aorta at 140 HU's with full mA between 30-70% of the R-R interval . Average HR during the scan was 45 bpm. The 3D data set was interpreted on a dedicated work station using MPR, MIP and VRT modes. A total of 80 cc of contrast was used.  FINDINGS: Non-cardiac: See separate report from [facility]. No significant findings on limited lung and soft tissue windows.  Calcium score: Mild 2 vessel calcium noted  LM 0  LAD 0.305  LCX 0  RCA 39.5  Total: 39.8 which is 41 st percentile for age/sex  Coronary Arteries: Right dominant with no anomalies  LM: Normal  LAD: 1-24% calcified plaque in mid vessel  D1: Normal  D2: Normal  D3 Normal  Circumflex: Normal  OM1: Normal  OM2: Normal  OM3: Normal  RCA: 1-24% calcified plaque in proximal, mid and distal vessel  PDA: Normal  PLA: Normal  IMPRESSION: 1. Calcium score 39.8 which is 41 st percentile for age/sex  2.  Normal ascending thoracic aorta 3.6 cm  3.  CAD RADS 1 non obstructive CAD see description above  Charlton Haws   Electronically Signed By: Charlton Haws  M.D. On: 04/26/2021 12:51  Narrative EXAM: OVER-READ INTERPRETATION  CT CHEST  The following report is an over-read performed by radiologist Dr. Trudie Reed of [facility] on 04/26/2021. This over-read does not include interpretation of cardiac or coronary anatomy or pathology. The coronary calcium score/coronary CTA interpretation by the cardiologist is attached.  COMPARISON:  No priors.  FINDINGS: Atherosclerotic calcifications in the thoracic aorta. Within the visualized portions of the thorax there are no suspicious appearing pulmonary nodules or masses, there is no acute consolidative airspace disease, no pleural effusions, no pneumothorax and no lymphadenopathy. Visualized portions of the upper abdomen are unremarkable. There are no aggressive appearing lytic or blastic lesions noted in the visualized portions of the skeleton.  IMPRESSION: 1.  Aortic Atherosclerosis (ICD10-I70.0).  Electronically Signed: By: Trudie Reed M.D. On: 04/26/2021 12:04   CARDIAC MRI  MR CARDIAC MORPHOLOGY W WO CONTRAST 09/04/2021  Narrative CLINICAL DATA:  Clinical question of hypertrophic cardiomyopathy Study assumes HCT of 45 and BSA of 2.12 m2.  EXAM: CARDIAC MRI  TECHNIQUE: The patient was scanned on a 1.5 Tesla GE magnet. A dedicated cardiac coil was used. Functional imaging was done using Fiesta sequences. 2,3, and 4 chamber views were done to assess for RWMA's. Modified Simpson's rule using a short axis stack was used to calculate an ejection fraction on a dedicated work [employer]. The patient received 10 cc of Gadavist. After 10 minutes inversion recovery sequences were used to assess for infiltration and scar tissue.  CONTRAST:  10 cc  of Gadavist  FINDINGS: 1. Normal left ventricular size, with LVEDD 46 mm, and LVEDVi 46 mL/m2.  Normal left ventricular thickness, with intraventricular septal thickness of 15 mm, posterior wall  thickness of 10 mm, though myocardial mass index normal for gender at 65 g/m2.  Normal left ventricular systolic function (LVEF =63%). There are no regional wall motion abnormalities. There is late peaking chordal systolic motion of the anterior mitral valve (chordal SAM).  Left ventricular parametric mapping notable for normal ECV signal and normal T2 signal.  There is no late gadolinium enhancement in the left ventricular myocardium. Six standard deviation reference used.  2. Normal right ventricular size with RVEDVI 87 mL/m2.  Normal right ventricular thickness.  Normal right ventricular systolic function (RVEF =52%). There are  no regional wall motion abnormalities or aneurysms.  3.  Normal left and right atrial size.  4. Normal size of the aortic root, ascending aorta and pulmonary artery.  5. Valve assessment:  Aortic Valve: Tri-leaflet aortic valve. Regurgitant fraction 14%, mean gradient of 1 mm Hg. Mild central aortic regurgitation.  Pulmonic Valve: Regurgitant fraction 2%, mean gradient < 1 mm Hg. Qualitatively, there is no significant regurgitation.  Tricuspid Valve: Regurgitant fraction 10%. Mild central tricuspid regurgitation  Mitral Valve: Regurgitant fraction 16%.  Mild mitral regurgitation.  6.  Normal pericardium.  No pericardial effusion.  7. Grossly, no extracardiac findings. Recommended dedicated study if concerned for non-cardiac pathology.  IMPRESSION: In absence of system disease leading to hypertrophy, no evidence of infiltrative disease and consistent with hypertrophic cardiomyopathy.  There is qualitatively turbulent LVOT flow often seen in hypertrophic cardiomyopathy.  No evidence of scar.  Riley Lam MD   Electronically Signed By: Riley Lam M.D. On: 09/05/2021 07:37           Recent Labs: 07/04/2022: BUN 36; Creatinine, Ser 1.37; Hemoglobin 14.7; Platelets 192; Potassium 4.1; Sodium 139  Recent Lipid  Panel No results found for: "CHOL", "TRIG", "HDL", "CHOLHDL", "VLDL", "LDLCALC", "LDLDIRECT"        Physical Exam:    VS:  BP (!) 120/56   Pulse (!) 41   Ht 5\' 8"  (1.727 m)   Wt 215 lb (97.5 kg)   SpO2 97%   BMI 32.69 kg/m     Wt Readings from Last 3 Encounters:  10/08/22 215 lb (97.5 kg)  08/25/22 205 lb 3.2 oz (93.1 kg)  07/24/22 206 lb (93.4 kg)    Gen: No distress Neck: No JVD Cardiac: No Rubs or Gallops, holosystolic murmur, regular bradycardia, +2 radial pulses Respiratory: Clear to auscultation bilaterally, normal effort, normal  respiratory rate GI: Soft, nontender, non-distended  MS: No leg edema;  moves all extremities Integument: Skin feels warm Neuro:  At time of evaluation, alert and oriented to person/place/time/situation  Psych: Normal affect, patient feels well   ASSESSMENT:    1. Essential hypertension   2. Obstructive hypertrophic cardiomyopathy (HCC)   3. Atrial fibrillation, unspecified type (HCC)      PLAN:    Hypertrophic Cardiomyopathy vs Hypertensive phenocopy - LVOT gradient has resolved on mavacamten 5 mg - NYHA Class I-II - He has entered catastrophic coverage for his CMI, the donut hole is project to leave in 2025, we will plan to keep him on mavacamten 5 mg (his symptoms are likely multi component from HCM, HTN) - will continue to wean down metoprolol; if BP elevated on this will add hydrochlorothiazide to his current avalide - continue mvacamten 12 week imaging  Paroxysmal Atrial Fibrillation,  - CHADSVASC=NA in the setting of HCM. - Continue anticoagulation; Acquired Thrombophilia - after 6 month post ASD closure if we will discuss if this was mediated predominantly by ASD closure and how aggressive to be  HLD (TGS) - if cramps persist will back off statin, continue lovaza  ASD - with RV dilation and without dysfunction - s/p closure with good result  Winter f/u with me      Medication Adjustments/Labs and Tests  Ordered: Current medicines are reviewed at length with the patient today.  Concerns regarding medicines are outlined above.  Orders Placed This Encounter  Procedures   EKG 12-Lead   Meds ordered this encounter  Medications   metoprolol succinate (TOPROL XL) 25 MG 24 hr tablet    Sig:  Take 1 tablet (25 mg total) by mouth daily.    Dispense:  90 tablet    Refill:  3    Patient Instructions  Medication Instructions:  Your physician has recommended you make the following change in your medication:  DECREASE: metoprolol succinate to 25 mg by mouth once daily  *If you need a refill on your cardiac medications before your next appointment, please call your pharmacy*   Lab Work: NONE If you have labs (blood work) drawn today and your tests are completely normal, you will receive your results only by: MyChart Message (if you have MyChart) OR A paper copy in the mail If you have any lab test that is abnormal or we need to change your treatment, we will call you to review the results.   Testing/Procedures: NONE   Follow-Up: At [facility], you and your health needs are our priority.  As part of our continuing mission to provide you with exceptional heart care, we have created designated Provider Care Teams.  These Care Teams include your primary Cardiologist (physician) and Advanced Practice Providers (APPs -  Physician Assistants and Nurse Practitioners) who all work together to provide you with the care you need, when you need it.  We recommend signing up for the patient portal called "MyChart".  Sign up information is provided on this After Visit Summary.  MyChart is used to connect with patients for Virtual Visits (Telemedicine).  Patients are able to view lab/test results, encounter notes, upcoming appointments, etc.  Non-urgent messages can be sent to your provider as well.   To learn more about what you can do with MyChart, go to [email or web address].    Your next  appointment:   4-5 month(s)  Provider:   Riley Lam, MD     Signed, Christell Constant, MD  10/08/2022 12:50 PM    [facility]

## 2022-10-08 NOTE — Patient Instructions (Signed)
Medication Instructions:  Your physician has recommended you make the following change in your medication:  DECREASE: metoprolol succinate to 25 mg by mouth once daily  *If you need a refill on your cardiac medications before your next appointment, please call your pharmacy*   Lab Work: NONE If you have labs (blood work) drawn today and your tests are completely normal, you will receive your results only by: MyChart Message (if you have MyChart) OR A paper copy in the mail If you have any lab test that is abnormal or we need to change your treatment, we will call you to review the results.   Testing/Procedures: NONE   Follow-Up: At [facility], you and your health needs are our priority.  As part of our continuing mission to provide you with exceptional heart care, we have created designated Provider Care Teams.  These Care Teams include your primary Cardiologist (physician) and Advanced Practice Providers (APPs -  Physician Assistants and Nurse Practitioners) who all work together to provide you with the care you need, when you need it.  We recommend signing up for the patient portal called "MyChart".  Sign up information is provided on this After Visit Summary.  MyChart is used to connect with patients for Virtual Visits (Telemedicine).  Patients are able to view lab/test results, encounter notes, upcoming appointments, etc.  Non-urgent messages can be sent to your provider as well.   To learn more about what you can do with MyChart, go to [email or web address].    Your next appointment:   4-5 month(s)  Provider:   Riley Lam, MD

## 2022-10-14 ENCOUNTER — Other Ambulatory Visit (HOSPITAL_COMMUNITY): Payer: Self-pay

## 2022-10-29 ENCOUNTER — Other Ambulatory Visit (HOSPITAL_COMMUNITY): Payer: Self-pay

## 2022-11-03 DIAGNOSIS — G4733 Obstructive sleep apnea (adult) (pediatric): Secondary | ICD-10-CM | POA: Diagnosis not present

## 2022-11-05 ENCOUNTER — Encounter: Payer: Self-pay | Admitting: Cardiovascular Disease

## 2022-11-07 ENCOUNTER — Ambulatory Visit (HOSPITAL_COMMUNITY): Payer: PPO | Attending: Internal Medicine

## 2022-11-07 DIAGNOSIS — I421 Obstructive hypertrophic cardiomyopathy: Secondary | ICD-10-CM

## 2022-11-07 LAB — ECHOCARDIOGRAM LIMITED
Area-P 1/2: 2.83 cm2
S' Lateral: 2.15 cm

## 2022-11-08 ENCOUNTER — Telehealth: Payer: Self-pay | Admitting: Internal Medicine

## 2022-11-08 DIAGNOSIS — I421 Obstructive hypertrophic cardiomyopathy: Secondary | ICD-10-CM

## 2022-11-08 NOTE — Telephone Encounter (Signed)
Called Patient in regard to Echo - LVEF 60%+ - LVOT Gradient 31 mm HG with decreased BB - patient feels better with lower BB - HTN has improved - no evidence of ASD residua  His wife has an appointment 11/21/22 and would like to hold off on his visit with Dr. 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 Allyson Sabal visit Decrease to 12.5 mg metoprolol - at 12 week echo may increase CMI dose to 10 mg - if feeling worse, go back to 25 mg - may increase to 10 mg in the future.  Patient had no further questions.  Riley Lam, MD Cardiologist [facility]  [address] [place], [address] [place], [place] [address] [phone]  3:16 PM

## 2022-11-10 ENCOUNTER — Other Ambulatory Visit (HOSPITAL_COMMUNITY): Payer: Self-pay

## 2022-11-10 MED ORDER — METOPROLOL SUCCINATE ER 25 MG PO TB24
12.5000 mg | ORAL_TABLET | Freq: Every day | ORAL | 3 refills | Status: DC
  Filled 2022-11-10 – 2022-12-24 (×4): qty 45, 90d supply, fill #0

## 2022-11-10 NOTE — Addendum Note (Signed)
Addended by: Virl Axe, Dellas Guard L on: 11/10/2022 03:23 PM   Modules accepted: Orders

## 2022-11-10 NOTE — Telephone Encounter (Signed)
Patient's [facility] portal has been update. Placed order for echo and sent in reduced dose of metoprolol 12.5 mg by mouth daily. Will send to Dr. Debby Bud nurse so she is aware.

## 2022-11-11 MED ORDER — MAVACAMTEN 5 MG PO CAPS: 5.0000 mg | ORAL_CAPSULE | Freq: Every day | ORAL | 2 refills | Status: DC

## 2022-11-11 NOTE — Telephone Encounter (Signed)
Refilled mavacamten 5 mg.

## 2022-11-11 NOTE — Addendum Note (Signed)
Addended by: Macie Burows on: 11/11/2022 02:17 PM   Modules accepted: Orders

## 2022-11-12 NOTE — Telephone Encounter (Signed)
Placed an order  for limited echo for Camzy[facility] f/u.

## 2022-11-12 NOTE — Addendum Note (Signed)
Addended by: Macie Burows on: 11/12/2022 10:19 AM   Modules accepted: Orders

## 2022-11-13 ENCOUNTER — Other Ambulatory Visit: Payer: Self-pay

## 2022-11-13 ENCOUNTER — Other Ambulatory Visit (HOSPITAL_COMMUNITY): Payer: Self-pay

## 2022-11-14 DIAGNOSIS — Z23 Encounter for immunization: Secondary | ICD-10-CM | POA: Diagnosis not present

## 2022-11-21 ENCOUNTER — Other Ambulatory Visit (HOSPITAL_COMMUNITY): Payer: Self-pay

## 2022-11-21 ENCOUNTER — Ambulatory Visit: Payer: PPO | Admitting: Cardiovascular Disease

## 2022-11-21 MED ORDER — ELIQUIS 5 MG PO TABS
5.0000 mg | ORAL_TABLET | Freq: Two times a day (BID) | ORAL | 3 refills | Status: DC
  Filled 2022-11-21: qty 60, 30d supply, fill #0
  Filled 2022-12-13: qty 60, 30d supply, fill #1
  Filled 2023-01-22: qty 60, 30d supply, fill #2
  Filled 2023-02-16: qty 60, 30d supply, fill #3

## 2022-11-25 ENCOUNTER — Telehealth (INDEPENDENT_AMBULATORY_CARE_PROVIDER_SITE_OTHER): Payer: PPO | Admitting: Cardiovascular Disease

## 2022-11-25 ENCOUNTER — Encounter (HOSPITAL_BASED_OUTPATIENT_CLINIC_OR_DEPARTMENT_OTHER): Payer: Self-pay | Admitting: Cardiovascular Disease

## 2022-11-25 VITALS — BP 112/62 | HR 45 | Ht 68.0 in | Wt 207.0 lb

## 2022-11-25 DIAGNOSIS — Q211 Atrial septal defect, unspecified: Secondary | ICD-10-CM

## 2022-11-25 DIAGNOSIS — G4733 Obstructive sleep apnea (adult) (pediatric): Secondary | ICD-10-CM

## 2022-11-25 DIAGNOSIS — E782 Mixed hyperlipidemia: Secondary | ICD-10-CM | POA: Diagnosis not present

## 2022-11-25 DIAGNOSIS — I1A0 Resistant hypertension: Secondary | ICD-10-CM

## 2022-11-25 DIAGNOSIS — I421 Obstructive hypertrophic cardiomyopathy: Secondary | ICD-10-CM | POA: Diagnosis not present

## 2022-11-25 DIAGNOSIS — R931 Abnormal findings on diagnostic imaging of heart and coronary circulation: Secondary | ICD-10-CM

## 2022-11-25 NOTE — Progress Notes (Signed)
Virtual Visit via Video Note   Because of Alan Spencer's co-morbid illnesses, he is at least at moderate risk for complications without adequate follow up.  This format is felt to be most appropriate for this patient at this time.  All issues noted in this document were discussed and addressed.  A limited physical exam was performed with this format.  Please refer to the patient's chart for his consent to telehealth for [facility].  The patient was identified using 2 identifiers.  Date:  11/25/2022   ID:  Alan Spencer, DOB 07/29/1955, MRN [id number]  Patient Location: Home Provider Location: Office/Clinic  PCP:  Alan Fillers, MD  Cardiologist:  Alan Batty, MD  Electrophysiologist:  None   Evaluation Performed:  Follow-Up Visit  CC: Hypertension  History of Present Illness:    The patient does not have symptoms concerning for COVID-19 infection (fever, chills, cough, or new shortness of breath).   Alan Spencer is a 67 y.o. male with a hx of hypertrophic cardiomyopathy on mavacamten, PAF, hypertension, hyperparathyroidism, hyperlipidemia, and OSA on CPAP, here for follow-up. He was initially seen 11/07/2021 in the [facility]. He has struggled with resistant hypertension in the 160s-170s. He had renal artery dopplers 06/2021 that had normal blood flow. Dr. Allyson Sabal referred him to the [facility]. He also had an echo 04/2021 suggestive of HCM. LVEF was 70-75% with moderate hypertrophy of the basal septum (1.45cm). He had a mild gradient with peak velocity of 1.6 m/s that increased to 3.2 m/s with valsalva. He was referred to Dr. Glean Spencer and had a cardiac MRI 08/2021 that was felt to be consistent with HCM. There was no LGE. He previously had a coronary CT 2/23 with minimal plaque in the RCA. His calcium score was 40 which was 41st percentile.   Mr. Igarashi reported having more energy off of carvedilol. It was thought he would be a very  good candidate for renal denervation. We recommended enrollment in the [facility]iance trio trial.  For this, spironolactone was stopped. In the interim HCTZ was increased to 25 mg.  At follow-up his blood pressure was higher in the office.  He ultimately did not qualify for that study because his blood pressure was controlled on 24-hour ambulatory monitor. On review of his imaging he was noted to have HCM, so he was referred to HCM clinic started on mavacamten. 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 He saw Dr. Excell Spencer 06/2022 and is scheduled to have ASD closure 07/24/22.  At his visit 01/2022, blood pressures remained uncontrolled. He had not qualified for the renal denervation study as his diastolic blood pressures were under 90 on ambulatory monitor. His systolics remained 140s-160s.  Since starting mavacamten, he hasn't noticed much of a difference in how he feels. Although he does note that his LVOT has improved. At his visit 06/2022 blood pressures were better controlled averaging in the 100s to 130s.  Given that he had some dizziness and blood pressures were running low, HCTZ was discontinued.  He did remain on a combination irbesartan/HCTZ, metoprolol, spironolactone, amlodipine, and hydralazine.  He underwent successful ASD closure with Dr. Excell Spencer on 07/2022.  He developed PAF.  DAPT was stopped and he was started on Eliquis.  He last saw Dr. Izora Ribas 09/2022 and noted fatigue and low heart rates despite reducing his beta-blocker.  Metoprolol was further reduced.  Today, he states he is doing well overall. At home his  blood pressure is 112/62. His heart rates have still been low after reducing metoprolol to 12.5 mg daily. Per his smart watch he has been maintaining sinus rhythm. He believes that the metoprolol does contribute to his fatigue, but he has been tolerating this better than when he was on atenolol. He notes his breathing seems to have improved but he still struggles at times.  For instance, he continues to play tennis, but by the second set he is not keeping up with the other players as well as he did in the past. He denies any chest pain, peripheral edema, headaches, syncope, orthopnea, or PND.  Previous Antihypertensives:   Past Medical History:  Diagnosis Date   Anxiety    Essential hypertension, benign    Excessive daytime sleepiness 07/03/2015   Family history of colon cancer    HNP (herniated nucleus pulposus), lumbar    L4-L5   Mixed hyperlipidemia    Obesity (BMI 30-39.9) 01/10/2018   Obstructive hypertrophic cardiomyopathy (HCC) 11/07/2021   Rectal bleeding    Resistant hypertension    Essential hypertension   Sleep apnea    uses CPAP    Past Surgical History:  Procedure Laterality Date   ATRIAL SEPTAL DEFECT(ASD) CLOSURE N/A 07/24/2022   Procedure: ATRIAL SEPTAL DEFECT(ASD) CLOSURE;  Surgeon: Alan Bollman, MD;  Location: [facility] INVASIVE CV LAB;  Service: Cardiovascular;  Laterality: N/A;   broken left elbow     NASAL SEPTUM SURGERY  2022   PARATHYROIDECTOMY N/A 12/27/2020   Procedure: NECK EXPLORATION WITH BIOPSY OF PARATHYROID GLAND x3;  Surgeon: Alan Level, MD;  Location: [facility] ORS;  Service: General;  Laterality: N/A;   TEE WITHOUT CARDIOVERSION N/A 06/13/2022   Procedure: TRANSESOPHAGEAL ECHOCARDIOGRAM (TEE);  Surgeon: Alan Constant, MD;  Location: [facility] ENDOSCOPY;  Service: Cardiovascular;  Laterality: N/A;   TONSILLECTOMY      Current Medications: Current Meds  Medication Sig   acetaminophen (TYLENOL) 500 MG tablet Take 500 mg by mouth as needed.   amLODipine (NORVASC) 10 MG tablet Take 1 tablet (10 mg total) by mouth daily.   amoxicillin (AMOXIL) 500 MG capsule Take 4 capsules (2,000 mg total) by mouth as directed. Take 4 tablets 1 hour prior to dental work, including cleanings.   apixaban (ELIQUIS) 5 MG TABS tablet Take 1 tablet (5 mg total) by mouth 2 (two) times daily.   bimatoprost (LUMIGAN) 0.01 % SOLN Place 1 drop into  both eyes daily as needed (redness/irritation.).   BIOTIN PO Take 1 tablet by mouth every evening. chewable   Coenzyme Q10 (COQ10) 100 MG CAPS Take 100 mg by mouth in the morning.   fluticasone (FLONASE) 50 MCG/ACT nasal spray Place 1 spray into both nostrils daily as needed for allergies.   glucosamine-chondroitin 500-400 MG tablet Take 1 tablet by mouth daily.   hydrALAZINE (APRESOLINE) 25 MG tablet Take 1 tablet (25 mg total) by mouth 3 (three) times daily as needed (for blood pressure above 160).   irbesartan-hydrochlorothiazide (AVALIDE) 300-12.5 MG tablet Take 1 tablet by mouth daily.   mavacamten (CAMZYOS) 5 MG CAPS capsule Take 1 capsule (5 mg total) by mouth daily.   metoprolol succinate (TOPROL XL) 25 MG 24 hr tablet Take 0.5 tablets (12.5 mg total) by mouth daily.   naproxen sodium (ALEVE) 220 MG tablet Take 440 mg by mouth 2 (two) times daily as needed (pain.).   olopatadine (PATADAY) 0.1 % ophthalmic solution Place 1 drop into both eyes daily as needed for allergies.   omega-3 acid ethyl  esters (LOVAZA) 1 g capsule Take 2 g by mouth 2 (two) times daily.   rosuvastatin (CRESTOR) 20 MG tablet Take 1 tablet (20 mg total) by mouth daily.   spironolactone (ALDACTONE) 25 MG tablet Take 1 tablet (25 mg total) by mouth daily.   TRIAMCINOLONE ACETONIDE EX Apply 1 Application topically daily as needed (skin irritation/rash).     Allergies:   Patient has no known allergies.   Social History   Socioeconomic History   Marital status: Married    Spouse name: Not on file   Number of children: Not on file   Years of education: Not on file   Highest education Spencer: Not on file  Occupational History   Not on file  Tobacco Use   Smoking status: Never   Smokeless tobacco: Never  Vaping Use   Vaping status: Never Used  Substance and Sexual Activity   Alcohol use: Yes    Comment: occas.   Drug use: No   Sexual activity: Not on file  Other Topics Concern   Not on file  Social History  Narrative   Not on file   Social Determinants of Health   Financial Resource Strain: Low Risk  (11/07/2021)   Overall Financial Resource Strain (CARDIA)    Difficulty of Paying Living Expenses: Not hard at all  Food Insecurity: No Food Insecurity (11/07/2021)   Hunger Vital Sign    Worried About Running Out of Food in the Last Year: Never true    Ran Out of Food in the Last Year: Never true  Transportation Needs: Unknown (11/07/2021)   PRAPARE - Administrator, Civil Service (Medical): No    Lack of Transportation (Non-Medical): Not on file  Physical Activity: Sufficiently Active (11/07/2021)   Exercise Vital Sign    Days of Exercise per Week: 7 days    Minutes of Exercise per Session: 70 min  Recent Concern: Physical Activity - Insufficiently Active (11/07/2021)   Exercise Vital Sign    Days of Exercise per Week: 4 days    Minutes of Exercise per Session: 30 min  Stress: Not on file  Social Connections: Not on file     Family History: The patient's family history includes Colon cancer in his paternal grandmother; Hypertension in his mother; Hypertrophic cardiomyopathy in his mother; Kidney failure in his father.  ROS:   Please see the history of present illness.    (+) Fatigue (+) Shortness of breath All other systems reviewed and are negative.  EKGs/Labs/Other Studies Reviewed:    TEE  06/13/2022:  1. Evidence of atrial Spencer shunting detected by color flow Doppler.  There is a moderately sized secundum atrial septal defect with  predominantly left to right shunting across the atrial septum. Largest  dimensions 2.39 cm by 2.22 cm. Smallest rims are  anterior (5 mm) and superior (6 mm). Sufficient dimensions for an  Amplatzer ASD- 024 device.   2. Chordal SAM with no significant LVOT gradient.. Left ventricular  ejection fraction, by estimation, is 65 to 70%. The left ventricle has  normal function. There is severe asymmetric left ventricular hypertrophy  of the  septal segment.   3. Right ventricular systolic function is normal. The right ventricular  size is moderately enlarged.   4. No left atrial/left atrial appendage thrombus was detected.   5. The mitral valve is normal in structure. Mild mitral valve  regurgitation. No evidence of mitral stenosis.   6. The aortic valve is tricuspid. Aortic valve regurgitation  is not  visualized. No aortic stenosis is present.   TEE 01/20/2022: IMPRESSIONS   1. Limited echo for HOCM   2. HOCM with 10 mmHg rest gradient- valsalva was not entirely accurate,  however, delta appears to be between 32 and 60 mmHg. Left ventricular  ejection fraction, by estimation, is 70 to 75%. Left ventricular ejection  fraction by 3D volume is 71 %. The  left ventricle has hyperdynamic function. The left ventricle has no  regional wall motion abnormalities. There is moderate asymmetric left  ventricular hypertrophy of the basal-septal segment and otherwise mild  concentric hypertrophy.   3. The average left ventricular global longitudinal strain is -22.0 %.  The global longitudinal strain is normal.   4. The aortic valve is tricuspid. Aortic valve regurgitation is not  visualized.   Comparison(s): Changes from prior study are noted. 04/19/2021: LVEF 70-75%,  peak gradient 10 mmHg - increased to 41.1 mmHg with valsalva.   LT Monitor 01/07/2022: Patch Wear Time:  13 days and 19 hours (2023-09-30T13:19:04-398 to 2023-10-14T09:06:05-398)   Patient had a min HR of 27 bpm, max HR of 129 bpm, and avg HR of 57 bpm. Predominant underlying rhythm was Sinus Rhythm. First Degree AV Block was present. 2 Supraventricular Tachycardia runs occurred, the run with the fastest interval lasting 4 beats  with a max rate of 129 bpm, the longest lasting 4 beats with an avg rate of 110 bpm. 33 Pauses occurred, the longest lasting 3.5 secs (17 bpm). Isolated SVEs were rare (<1.0%), SVE Couplets were rare (<1.0%), and SVE Triplets were rare (<1.0%).  Isolated  VEs were rare (<1.0%, 199), and no VE Couplets or VE Triplets were present.   1: SR/SB (down to 20 BPM in AM hours)/ST 2.  Short runs of SVT 3: Pauses up to 3.5 seconds  Cardiac MRI 09/04/2021: FINDINGS: 1. Normal left ventricular size, with LVEDD 46 mm, and LVEDVi 46 mL/m2.   Normal left ventricular thickness, with intraventricular septal thickness of 15 mm, posterior wall thickness of 10 mm, though myocardial mass index normal for gender at 65 g/m2.   Normal left ventricular systolic function (LVEF =63%). There are no regional wall motion abnormalities. There is late peaking chordal systolic motion of the anterior mitral valve (chordal SAM).   Left ventricular parametric mapping notable for normal ECV signal and normal T2 signal.   There is no late gadolinium enhancement in the left ventricular myocardium. Six standard deviation reference used.   2. Normal right ventricular size with RVEDVI 87 mL/m2.   Normal right ventricular thickness.   Normal right ventricular systolic function (RVEF =52%). There are no regional wall motion abnormalities or aneurysms.   3.  Normal left and right atrial size.   4. Normal size of the aortic root, ascending aorta and pulmonary artery.   5. Valve assessment:   Aortic Valve: Tri-leaflet aortic valve. Regurgitant fraction 14%, mean gradient of 1 mm Hg. Mild central aortic regurgitation.   Pulmonic Valve: Regurgitant fraction 2%, mean gradient < 1 mm Hg. Qualitatively, there is no significant regurgitation.   Tricuspid Valve: Regurgitant fraction 10%. Mild central tricuspid regurgitation   Mitral Valve: Regurgitant fraction 16%.  Mild mitral regurgitation.   6.  Normal pericardium.  No pericardial effusion.   7. Grossly, no extracardiac findings. Recommended dedicated study if concerned for non-cardiac pathology.   IMPRESSION: In absence of system disease leading to hypertrophy, no evidence of infiltrative disease and  consistent with hypertrophic cardiomyopathy.   There is qualitatively turbulent  LVOT flow often seen in hypertrophic cardiomyopathy.   No evidence of scar.  Renal Artery Doppler 07/02/2021: Summary:  Largest Aortic Diameter: 1.9 cm     Renal:     Right: Normal size right kidney. Normal cortical thickness of right         kidney. No evidence of right renal artery stenosis. Cyst(s)         noted. RRV flow present. Abnormal right Resistive Index.         Avascular cystic structure in lower polen of the kidney,         measuring 2.7 x 2.0 x 3.3 cm.  Left:  Normal size of left kidney. Normal cortical thickness of the         left kidney. No evidence of left renal artery stenosis. LRV         flow present. Abnormal left Resisitve Index.  Mesenteric:  Normal Celiac artery and Superior Mesenteric artery findings.     Patent IVC.  Cardiac CTA 04/26/2021: FINDINGS: Non-cardiac: See separate report from [facility]. No significant findings on limited lung and soft tissue windows.   Calcium score: Mild 2 vessel calcium noted   LM 0   LAD 0.305   LCX 0   RCA 39.5   Total: 39.8 which is 41 st percentile for age/sex   Coronary Arteries: Right dominant with no anomalies   LM: Normal   LAD: 1-24% calcified plaque in mid vessel   D1: Normal   D2: Normal   D3 Normal   Circumflex: Normal   OM1: Normal   OM2: Normal   OM3: Normal   RCA: 1-24% calcified plaque in proximal, mid and distal vessel   PDA: Normal   PLA: Normal   IMPRESSION: 1. Calcium score 39.8 which is 41 st percentile for age/sex   2.  Normal ascending thoracic aorta 3.6 cm   3.  CAD RADS 1 non obstructive CAD see description above  Echocardiogram 04/19/2021: IMPRESSIONS   1. Moderate hypertophy of the basal septum with otherwise mild concentric  LVH. Mild intracavitary gradient. Peak velocity 1.6 m/s. Peak gradient 10  mmHg. Velocity increases to 3.2 m/s and peak gradient increases to  41.2  mmHg. Left ventricular ejection  fraction, by estimation, is 70 to 75%. The left ventricle has hyperdynamic  function. The left ventricle has no regional wall motion abnormalities.  There is moderate left ventricular hypertrophy of the basal-septal  segment. Left ventricular diastolic  parameters are consistent with Grade I diastolic dysfunction (impaired  relaxation). The average left ventricular global longitudinal strain is  -20.4 %. The global longitudinal strain is normal.   2. Right ventricular systolic function is normal. The right ventricular  size is mildly enlarged. There is normal pulmonary artery systolic  pressure.   3. Left atrial size was severely dilated.   4. Right atrial size was severely dilated.   5. The mitral valve is normal in structure. Trivial mitral valve  regurgitation. No evidence of mitral stenosis.   6. The aortic valve is tricuspid. There is mild calcification of the  aortic valve. There is mild thickening of the aortic valve. Aortic valve  regurgitation is not visualized. No aortic stenosis is present.   7. Aortic dilatation noted. There is mild dilatation of the ascending  aorta, measuring 37 mm.   8. The inferior vena cava is dilated in size with >50% respiratory  variability, suggesting right atrial pressure of 8 mmHg.     EKG:  EKG  is personally reviewed. 07/10/2022:  EKG was not ordered. 02/05/2022: EKG was not ordered.  12/02/2021:  EKG was not ordered. 11/07/2021:  EKG was not ordered.  Recent Labs: 07/04/2022: BUN 36; Creatinine, Ser 1.37; Hemoglobin 14.7; Platelets 192; Potassium 4.1; Sodium 139   Recent Lipid Panel No results found for: "CHOL", "TRIG", "HDL", "CHOLHDL", "VLDL", "LDLCALC", "LDLDIRECT"  Physical Exam:    BP 112/62   Pulse (!) 45   Ht 5\' 8"  (1.727 m)   Wt 207 lb (93.9 kg)   BMI 31.47 kg/m  GENERAL: Well-appearing.  No acute distress. HEENT: Pupils equal round.  Oral mucosa unremarkable NECK:  No jugular venous  distention, no visible thyromegaly EXT:  No edema, no cyanosis no clubbing SKIN:  No rashes no nodules NEURO:  Speech fluent.  Cranial nerves grossly intact.  Moves all 4 extremities freely PSYCH:  Cognitively intact, oriented to person place and time  ASSESSMENT/PLAN:       # Hypertrophic Cardiomyopathy Patient reports decreased exercise tolerance and stamina, possibly due to increased gradient on recent echo (31, up from 5 mmHg). Currently on Metoprolol 12.5mg  daily and bradycardic, which may be contributing.  -Continue Metoprolol 12.5mg  daily. - He will exercise on treadmill to assess heart rate response and symptom correlation.  Target HR for exercise is around 105 bpm. - If heart rate does not increase, consider holding metoprolol for a couple days to assess heart rate response and symptom correlation. -Plan to follow up with Dr. Izora Ribas after echo in November.  #  Hypertension: Blood pressure well controlled on current regimen of amlodipine, hydralazine, irbesartan/HCTZ, metoprolol and spironolactone. Patient has been cutting 25mg  Metoprolol tablets in half. -Change prescription to Metoprolol 12.5mg  for patient convenience. -He can graduate from [facility] HTN clinic.  Patient to continue follow-ups with Dr. Izora Ribas for hypertrophic cardiomyopathy management.   # ASD:  S/p ASD closure.  Doing well.  Off antiplatelets given need for anticoagulation.    # PAF:  Maintaining sinus rhythm.  On beta blocker and Eliquis.     # Non-obstructive CAD:  # Hyperlipidemia:  Medical management.   Continue exercise.   Screening for Secondary Hypertension:     11/07/2021   11:53 AM  Causes  Drugs/Herbals Screened     - Comments not salt conscious, minimal EtOH, minimal caffeine  Renovascular HTN Screened     - Comments renal Dopplers negative  Sleep Apnea Screened     - Comments uses CPAP  Thyroid Disease Screened     - Comments will send TSH from PCP    Relevant  Labs/Studies:    Latest Ref Rng & Units 07/04/2022    9:47 AM 12/28/2020    3:55 AM 12/21/2020    1:55 PM  Basic Labs  Sodium 134 - 144 mmol/L 139  139  142   Potassium 3.5 - 5.2 mmol/L 4.1  3.5  3.5   Creatinine 0.76 - 1.27 mg/dL 1.61  0.96  0.45           Latest Ref Rng & Units 07/30/2021   12:03 PM  Renin/Aldosterone   Aldosterone 0.0 - 30.0 ng/dL 6.7   Renin 4.098 - 1.191 ng/mL/hr 0.290   Aldos/Renin Ratio 0.0 - 30.0 23.1              07/02/2021   12:40 PM  Renovascular   Renal Artery [place] Completed Yes     COVID-19 Education: The signs and symptoms of COVID-19 were discussed with the patient and how to seek  care for testing (follow up with PCP or arrange E-visit).  The importance of social distancing was discussed today.  Time:   Today, I have spent 15 minutes with the patient with telehealth technology discussing the above problems.    Disposition:    FU with Dr. Izora Ribas following echocardiogram. FU with Nain Rudd C. Duke Salvia, MD, [facility] as needed.  Medication Adjustments/Labs and Tests Ordered: Current medicines are reviewed at length with the patient today.  Concerns regarding medicines are outlined above.    No orders of the defined types were placed in this encounter.  No orders of the defined types were placed in this encounter.  I,Mathew Stumpf,acting as a Neurosurgeon for Chilton Si, MD.,have documented all relevant documentation on the behalf of Chilton Si, MD,as directed by  Chilton Si, MD while in the presence of Chilton Si, MD.  I, Jomel Whittlesey C. Duke Salvia, MD have reviewed all documentation for this visit.  The documentation of the exam, diagnosis, procedures, and orders on 11/25/2022 are all accurate and complete.  Signed, Chilton Si, MD  11/25/2022 5:31 PM    [facility]

## 2022-11-25 NOTE — Progress Notes (Deleted)
Virtual Visit via Video Note   Because of Alan Spencer's co-morbid illnesses, he is at least at moderate risk for complications without adequate follow up.  This format is felt to be most appropriate for this patient at this time.  All issues noted in this document were discussed and addressed.  A limited physical exam was performed with this format.  Please refer to the patient's chart for his consent to telehealth for [facility].   The patient was identified using 2 identifiers.  Date:  11/25/2022   ID:  Alan Spencer, DOB 03/30/1955, MRN [id number]  Patient Location: Home Provider Location: Office/Clinic  PCP:  Garlan Fillers, MD  Cardiologist:  Nanetta Batty, MD  Electrophysiologist:  None   Evaluation Performed:  Follow-Up Visit  CC: Hypertension  History of Present Illness:    The patient does not have symptoms concerning for COVID-19 infection (fever, chills, cough, or new shortness of breath).   Alan Spencer is a 67 y.o. male with a hx of hypertrophic cardiomyopathy on mavacamten, PAF, hypertension, hyperparathyroidism, hyperlipidemia, and OSA on CPAP, here for follow-up. He was initially seen 11/07/2021 in the [facility]. He has struggled with resistant hypertension in the 160s-170s. He had renal artery dopplers 06/2021 that had normal blood flow. Dr. Allyson Sabal referred him to the [facility]. He also had an echo 04/2021 suggestive of HCM. LVEF was 70-75% with moderate hypertrophy of the basal septum (1.45cm). He had a mild gradient with peak velocity of 1.6 m/s that increased to 3.2 m/s with valsalva. He was referred to Dr. Glean Hess and had a cardiac MRI 08/2021 that was felt to be consistent with HCM. There was no LGE. He previously had a coronary CT 2/23 with minimal plaque in the RCA. His calcium score was 40 which was 41st percentile.   Alan Spencer reported having more energy off of carvedilol. It was thought he would be a  very good candidate for renal denervation. We recommended enrollment in the [facility] trio trial.  For this, spironolactone was stopped. In the interim HCTZ was increased to 25 mg.  At follow-up his blood pressure was higher in the office.  He ultimately did not qualify for that study because his blood pressure was controlled on 24-hour ambulatory monitor. On review of his imaging he was noted to have HCM, so he was referred to HCM clinic started on mavacamten. 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 He saw Dr. Excell Seltzer 06/2022 and is scheduled to have ASD closure 07/24/22.  At his visit 01/2022, blood pressures remained uncontrolled. He had not qualified for the renal denervation study as his diastolic blood pressures were under 90 on ambulatory monitor. His systolics remained 140s-160s.  Since starting mavacamten, he hasn't noticed much of a difference in how he feels. Although he does note that his LVOT has improved. At his visit 06/2022 blood pressures were better controlled averaging in the 100s to 130s.  Given that he had some dizziness and blood pressures were running low, HCTZ was discontinued.  He did remain on a combination irbesartan/HCTZ, metoprolol, spironolactone, amlodipine, and hydralazine.  He underwent successful ASD closure with Dr. Excell Seltzer on 07/2022.  He developed PAF.  DAPT was stopped and he was started on Eliquis.  He last saw Dr. Izora Ribas 09/2022 and noted fatigue and low heart rates despite reducing his beta-blocker.  Metoprolol was further reduced.  Previous Antihypertensives:   Past Medical History:  Diagnosis Date  Anxiety    Essential hypertension, benign    Excessive daytime sleepiness 07/03/2015   Family history of colon cancer    HNP (herniated nucleus pulposus), lumbar    L4-L5   Mixed hyperlipidemia    Obesity (BMI 30-39.9) 01/10/2018   Obstructive hypertrophic cardiomyopathy (HCC) 11/07/2021   Rectal bleeding    Resistant hypertension     Essential hypertension   Sleep apnea    uses CPAP    Past Surgical History:  Procedure Laterality Date   ATRIAL SEPTAL DEFECT(ASD) CLOSURE N/A 07/24/2022   Procedure: ATRIAL SEPTAL DEFECT(ASD) CLOSURE;  Surgeon: Tonny Bollman, MD;  Location: [facility] INVASIVE CV LAB;  Service: Cardiovascular;  Laterality: N/A;   broken left elbow     NASAL SEPTUM SURGERY  2022   PARATHYROIDECTOMY N/A 12/27/2020   Procedure: NECK EXPLORATION WITH BIOPSY OF PARATHYROID GLAND x3;  Surgeon: Darnell Level, MD;  Location: [facility] ORS;  Service: General;  Laterality: N/A;   TEE WITHOUT CARDIOVERSION N/A 06/13/2022   Procedure: TRANSESOPHAGEAL ECHOCARDIOGRAM (TEE);  Surgeon: Christell Constant, MD;  Location: [facility] ENDOSCOPY;  Service: Cardiovascular;  Laterality: N/A;   TONSILLECTOMY      Current Medications: Current Meds  Medication Sig   acetaminophen (TYLENOL) 500 MG tablet Take 500 mg by mouth as needed.   amLODipine (NORVASC) 10 MG tablet Take 1 tablet (10 mg total) by mouth daily.   amoxicillin (AMOXIL) 500 MG capsule Take 4 capsules (2,000 mg total) by mouth as directed. Take 4 tablets 1 hour prior to dental work, including cleanings.   apixaban (ELIQUIS) 5 MG TABS tablet Take 1 tablet (5 mg total) by mouth 2 (two) times daily.   bimatoprost (LUMIGAN) 0.01 % SOLN Place 1 drop into both eyes daily as needed (redness/irritation.).   BIOTIN PO Take 1 tablet by mouth every evening. chewable   Coenzyme Q10 (COQ10) 100 MG CAPS Take 100 mg by mouth in the morning.   fluticasone (FLONASE) 50 MCG/ACT nasal spray Place 1 spray into both nostrils daily as needed for allergies.   glucosamine-chondroitin 500-400 MG tablet Take 1 tablet by mouth daily.   hydrALAZINE (APRESOLINE) 25 MG tablet Take 1 tablet (25 mg total) by mouth 3 (three) times daily as needed (for blood pressure above 160).   irbesartan-hydrochlorothiazide (AVALIDE) 300-12.5 MG tablet Take 1 tablet by mouth daily.   mavacamten (CAMZYOS) 5 MG CAPS capsule Take  1 capsule (5 mg total) by mouth daily.   metoprolol succinate (TOPROL XL) 25 MG 24 hr tablet Take 0.5 tablets (12.5 mg total) by mouth daily.   naproxen sodium (ALEVE) 220 MG tablet Take 440 mg by mouth 2 (two) times daily as needed (pain.).   olopatadine (PATADAY) 0.1 % ophthalmic solution Place 1 drop into both eyes daily as needed for allergies.   omega-3 acid ethyl esters (LOVAZA) 1 g capsule Take 2 g by mouth 2 (two) times daily.   rosuvastatin (CRESTOR) 20 MG tablet Take 1 tablet (20 mg total) by mouth daily.   spironolactone (ALDACTONE) 25 MG tablet Take 1 tablet (25 mg total) by mouth daily.   TRIAMCINOLONE ACETONIDE EX Apply 1 Application topically daily as needed (skin irritation/rash).     Allergies:   Patient has no known allergies.   Social History   Socioeconomic History   Marital status: Married    Spouse name: Not on file   Number of children: Not on file   Years of education: Not on file   Highest education level: Not on file  Occupational  History   Not on file  Tobacco Use   Smoking status: Never   Smokeless tobacco: Never  Vaping Use   Vaping status: Never Used  Substance and Sexual Activity   Alcohol use: Yes    Comment: occas.   Drug use: No   Sexual activity: Not on file  Other Topics Concern   Not on file  Social History Narrative   Not on file   Social Determinants of Health   Financial Resource Strain: Low Risk  (11/07/2021)   Overall Financial Resource Strain (CARDIA)    Difficulty of Paying Living Expenses: Not hard at all  Food Insecurity: No Food Insecurity (11/07/2021)   Hunger Vital Sign    Worried About Running Out of Food in the Last Year: Never true    Ran Out of Food in the Last Year: Never true  Transportation Needs: Unknown (11/07/2021)   PRAPARE - Administrator, Civil Service (Medical): No    Lack of Transportation (Non-Medical): Not on file  Physical Activity: Sufficiently Active (11/07/2021)   Exercise Vital Sign     Days of Exercise per Week: 7 days    Minutes of Exercise per Session: 70 min  Recent Concern: Physical Activity - Insufficiently Active (11/07/2021)   Exercise Vital Sign    Days of Exercise per Week: 4 days    Minutes of Exercise per Session: 30 min  Stress: Not on file  Social Connections: Not on file     Family History: The patient's family history includes Colon cancer in his paternal grandmother; Hypertension in his mother; Hypertrophic cardiomyopathy in his mother; Kidney failure in his father.  ROS:   Please see the history of present illness.    (+) Dizziness All other systems reviewed and are negative.  EKGs/Labs/Other Studies Reviewed:    TEE  06/13/2022:  1. Evidence of atrial level shunting detected by color flow Doppler.  There is a moderately sized secundum atrial septal defect with  predominantly left to right shunting across the atrial septum. Largest  dimensions 2.39 cm by 2.22 cm. Smallest rims are  anterior (5 mm) and superior (6 mm). Sufficient dimensions for an  Amplatzer ASD- 024 device.   2. Chordal SAM with no significant LVOT gradient.. Left ventricular  ejection fraction, by estimation, is 65 to 70%. The left ventricle has  normal function. There is severe asymmetric left ventricular hypertrophy  of the septal segment.   3. Right ventricular systolic function is normal. The right ventricular  size is moderately enlarged.   4. No left atrial/left atrial appendage thrombus was detected.   5. The mitral valve is normal in structure. Mild mitral valve  regurgitation. No evidence of mitral stenosis.   6. The aortic valve is tricuspid. Aortic valve regurgitation is not  visualized. No aortic stenosis is present.   TEE 01/20/2022: IMPRESSIONS   1. Limited echo for HOCM   2. HOCM with 10 mmHg rest gradient- valsalva was not entirely accurate,  however, delta appears to be between 32 and 60 mmHg. Left ventricular  ejection fraction, by estimation, is 70 to  75%. Left ventricular ejection  fraction by 3D volume is 71 %. The  left ventricle has hyperdynamic function. The left ventricle has no  regional wall motion abnormalities. There is moderate asymmetric left  ventricular hypertrophy of the basal-septal segment and otherwise mild  concentric hypertrophy.   3. The average left ventricular global longitudinal strain is -22.0 %.  The global longitudinal strain is normal.  4. The aortic valve is tricuspid. Aortic valve regurgitation is not  visualized.   Comparison(s): Changes from prior study are noted. 04/19/2021: LVEF 70-75%,  peak gradient 10 mmHg - increased to 41.1 mmHg with valsalva.   LT Monitor 01/07/2022: Patch Wear Time:  13 days and 19 hours (2023-09-30T13:19:04-398 to 2023-10-14T09:06:05-398)   Patient had a min HR of 27 bpm, max HR of 129 bpm, and avg HR of 57 bpm. Predominant underlying rhythm was Sinus Rhythm. First Degree AV Block was present. 2 Supraventricular Tachycardia runs occurred, the run with the fastest interval lasting 4 beats  with a max rate of 129 bpm, the longest lasting 4 beats with an avg rate of 110 bpm. 33 Pauses occurred, the longest lasting 3.5 secs (17 bpm). Isolated SVEs were rare (<1.0%), SVE Couplets were rare (<1.0%), and SVE Triplets were rare (<1.0%). Isolated  VEs were rare (<1.0%, 199), and no VE Couplets or VE Triplets were present.   1: SR/SB (down to 20 BPM in AM hours)/ST 2.  Short runs of SVT 3: Pauses up to 3.5 seconds  Cardiac MRI 09/04/2021: FINDINGS: 1. Normal left ventricular size, with LVEDD 46 mm, and LVEDVi 46 mL/m2.   Normal left ventricular thickness, with intraventricular septal thickness of 15 mm, posterior wall thickness of 10 mm, though myocardial mass index normal for gender at 65 g/m2.   Normal left ventricular systolic function (LVEF =63%). There are no regional wall motion abnormalities. There is late peaking chordal systolic motion of the anterior mitral valve  (chordal SAM).   Left ventricular parametric mapping notable for normal ECV signal and normal T2 signal.   There is no late gadolinium enhancement in the left ventricular myocardium. Six standard deviation reference used.   2. Normal right ventricular size with RVEDVI 87 mL/m2.   Normal right ventricular thickness.   Normal right ventricular systolic function (RVEF =52%). There are no regional wall motion abnormalities or aneurysms.   3.  Normal left and right atrial size.   4. Normal size of the aortic root, ascending aorta and pulmonary artery.   5. Valve assessment:   Aortic Valve: Tri-leaflet aortic valve. Regurgitant fraction 14%, mean gradient of 1 mm Hg. Mild central aortic regurgitation.   Pulmonic Valve: Regurgitant fraction 2%, mean gradient < 1 mm Hg. Qualitatively, there is no significant regurgitation.   Tricuspid Valve: Regurgitant fraction 10%. Mild central tricuspid regurgitation   Mitral Valve: Regurgitant fraction 16%.  Mild mitral regurgitation.   6.  Normal pericardium.  No pericardial effusion.   7. Grossly, no extracardiac findings. Recommended dedicated study if concerned for non-cardiac pathology.   IMPRESSION: In absence of system disease leading to hypertrophy, no evidence of infiltrative disease and consistent with hypertrophic cardiomyopathy.   There is qualitatively turbulent LVOT flow often seen in hypertrophic cardiomyopathy.   No evidence of scar.  Renal Artery Doppler 07/02/2021: Summary:  Largest Aortic Diameter: 1.9 cm     Renal:     Right: Normal size right kidney. Normal cortical thickness of right         kidney. No evidence of right renal artery stenosis. Cyst(s)         noted. RRV flow present. Abnormal right Resistive Index.         Avascular cystic structure in lower polen of the kidney,         measuring 2.7 x 2.0 x 3.3 cm.  Left:  Normal size of left kidney. Normal cortical thickness of the  left kidney. No  evidence of left renal artery stenosis. LRV         flow present. Abnormal left Resisitve Index.  Mesenteric:  Normal Celiac artery and Superior Mesenteric artery findings.     Patent IVC.  Cardiac CTA 04/26/2021:   IMPRESSION: 1. Calcium score 39.8 which is 41 st percentile for age/sex   2.  Normal ascending thoracic aorta 3.6 cm   3.  CAD RADS 1 non obstructive CAD see description above  Echocardiogram 04/19/2021: IMPRESSIONS     1. Moderate hypertophy of the basal septum with otherwise mild concentric  LVH. Mild intracavitary gradient. Peak velocity 1.6 m/s. Peak gradient 10  mmHg. Velocity increases to 3.2 m/s and peak gradient increases to 41.2  mmHg. Left ventricular ejection  fraction, by estimation, is 70 to 75%. The left ventricle has hyperdynamic  function. The left ventricle has no regional wall motion abnormalities.  There is moderate left ventricular hypertrophy of the basal-septal  segment. Left ventricular diastolic  parameters are consistent with Grade I diastolic dysfunction (impaired  relaxation). The average left ventricular global longitudinal strain is  -20.4 %. The global longitudinal strain is normal.   2. Right ventricular systolic function is normal. The right ventricular  size is mildly enlarged. There is normal pulmonary artery systolic  pressure.   3. Left atrial size was severely dilated.   4. Right atrial size was severely dilated.   5. The mitral valve is normal in structure. Trivial mitral valve  regurgitation. No evidence of mitral stenosis.   6. The aortic valve is tricuspid. There is mild calcification of the  aortic valve. There is mild thickening of the aortic valve. Aortic valve  regurgitation is not visualized. No aortic stenosis is present.   7. Aortic dilatation noted. There is mild dilatation of the ascending  aorta, measuring 37 mm.   8. The inferior vena cava is dilated in size with >50% respiratory  variability, suggesting right  atrial pressure of 8 mmHg.     EKG:  EKG is personally reviewed. 07/10/2022:  EKG was not ordered. 02/05/2022: EKG was not ordered.  12/02/2021:  EKG was not ordered. 11/07/2021:  EKG was not ordered.  Recent Labs: 07/04/2022: BUN 36; Creatinine, Ser 1.37; Hemoglobin 14.7; Platelets 192; Potassium 4.1; Sodium 139   Recent Lipid Panel No results found for: "CHOL", "TRIG", "HDL", "CHOLHDL", "VLDL", "LDLCALC", "LDLDIRECT"  Physical Exam:    VS:  BP 112/62   Pulse (!) 45   Ht 5\' 8"  (1.727 m)   Wt 207 lb (93.9 kg)   BMI 31.47 kg/m  , BMI Body mass index is 31.47 kg/m. GENERAL:  Well appearing HEENT: Pupils equal round and reactive, fundi not visualized, oral mucosa unremarkable NECK:  No jugular venous distention, waveform within normal limits, carotid upstroke brisk and symmetric, no bruits, no thyromegaly LUNGS:  Clear to auscultation bilaterally HEART:  RRR.  PMI not displaced or sustained,S1 and S2 within normal limits, no S3, no S4, no clicks, no rubs, III/VI systolic murmur at the LUSB ABD:  Flat, positive bowel sounds normal in frequency in pitch, no bruits, no rebound, no guarding, no midline pulsatile mass, no hepatomegaly, no splenomegaly EXT:  2 plus pulses throughout, no edema, no cyanosis no clubbing SKIN:  No rashes no nodules NEURO:  Cranial nerves II through XII grossly intact, motor grossly intact throughout PSYCH:  Cognitively intact, oriented to person place and time  ASSESSMENT/PLAN:    Assessment and Plan    # Hypertrophic  Cardiomyopathy Patient reports decreased exercise tolerance and stamina, possibly due to increased gradient on recent echo (31, up from 5 mmHg). Currently on Metoprolol 12.5mg  daily and bradycardic, which may be contributing.  -Continue Metoprolol 12.5mg  daily. - He will exercise on treadmill to assess heart rate response and symptom correlation.  Target HR for exercise is around 105 bpm. - If heart rate does not increase, consider holding  metoprolol for a couple days to assess heart rate response and symptom correlation. -Plan to follow up with Dr. Izora Ribas after echo in November.  #  Hypertension: Blood pressure well controlled on current regimen of amlodipine, hydralazine, irbesartan/HCTZ, metoprolol and spironolactone. Patient has been cutting 25mg  Metoprolol tablets in half. -Change prescription to Metoprolol 12.5mg  for patient convenience. -He can graduate from ADV HTN clinic.  Patient to continue follow-ups with Dr. Izora Ribas for hypertrophic cardiomyopathy management.   # ASD:  S/p ASD closure.  Doing well.  Off antiplatelets given need for anticoagulation.    # PAF:  Maintaining sinus rhythm.  On beta blocker and Eliquis.     # Non-obstructive CAD:  # Hyperlipidemia:  Continue medical management and exercise.    Screening for Secondary Hypertension:     11/07/2021   11:53 AM  Causes  Drugs/Herbals Screened     - Comments not salt conscious, minimal EtOH, minimal caffeine  Renovascular HTN Screened     - Comments renal Dopplers negative  Sleep Apnea Screened     - Comments uses CPAP  Thyroid Disease Screened     - Comments will send TSH from PCP    Relevant Labs/Studies:    Latest Ref Rng & Units 07/04/2022    9:47 AM 12/28/2020    3:55 AM 12/21/2020    1:55 PM  Basic Labs  Sodium 134 - 144 mmol/L 139  139  142   Potassium 3.5 - 5.2 mmol/L 4.1  3.5  3.5   Creatinine 0.76 - 1.27 mg/dL 4.03  4.74  2.59           Latest Ref Rng & Units 07/30/2021   12:03 PM  Renin/Aldosterone   Aldosterone 0.0 - 30.0 ng/dL 6.7   Renin 5.638 - 7.564 ng/mL/hr 0.290   Aldos/Renin Ratio 0.0 - 30.0 23.1              07/02/2021   12:40 PM  Renovascular   Renal Artery [place] Completed Yes     COVID-19 Education: The signs and symptoms of COVID-19 were discussed with the patient and how to seek care for testing (follow up with PCP or arrange E-visit).  The importance of social distancing was discussed  today.  Time:   Today, I have spent 22 minutes with the patient with telehealth technology discussing the above problems.    Disposition:    FU with Nelwyn Hebdon C. Duke Salvia, MD, [facility] in 3 months in virtual visit.  Medication Adjustments/Labs and Tests Ordered: Current medicines are reviewed at length with the patient today.  Concerns regarding medicines are outlined above.    No orders of the defined types were placed in this encounter.  No orders of the defined types were placed in this encounter.  I,Mathew Stumpf,acting as a Neurosurgeon for Chilton Si, MD.,have documented all relevant documentation on the behalf of Chilton Si, MD,as directed by  Chilton Si, MD while in the presence of Chilton Si, MD.  I, Skylynne Schlechter C. Duke Salvia, MD have reviewed all documentation for this visit.  The documentation of the exam, diagnosis, procedures, and  orders on 11/25/2022 are all accurate and complete.  Signed, Chilton Si, MD  11/25/2022 5:30 PM    [facility]

## 2022-11-25 NOTE — Patient Instructions (Addendum)
Medication Instructions:  Your physician recommends that you continue on your current medications as directed. Please refer to the Current Medication list given to you today.   Labwork: NONE  Testing/Procedures: NONE  Follow-Up: AS NEEDED WITH DR [facility]   WITH Dr Izora Ribas in November after Echo   Any Other Special Instructions Will Be Listed Below (If Applicable). WHEN YOU WALK ON THE TREADMILL IF YOU ARE UNABLE TO GET YOUR HEART RATE UP TO 105 HOLD YOUR METOPROLOL FOR COUPLE OF DAYS AND TRY AGAIN. IF STILL UNABLE TO GET YOUR HEART RATE UP CONTACT OFFICE.   If you need a refill on your cardiac medications before your next appointment, please call your pharmacy.

## 2022-12-01 ENCOUNTER — Encounter (HOSPITAL_BASED_OUTPATIENT_CLINIC_OR_DEPARTMENT_OTHER): Payer: Self-pay | Admitting: Cardiovascular Disease

## 2022-12-04 DIAGNOSIS — G4733 Obstructive sleep apnea (adult) (pediatric): Secondary | ICD-10-CM | POA: Diagnosis not present

## 2022-12-08 DIAGNOSIS — E785 Hyperlipidemia, unspecified: Secondary | ICD-10-CM | POA: Diagnosis not present

## 2022-12-08 DIAGNOSIS — I10 Essential (primary) hypertension: Secondary | ICD-10-CM | POA: Diagnosis not present

## 2022-12-13 ENCOUNTER — Other Ambulatory Visit (HOSPITAL_COMMUNITY): Payer: Self-pay

## 2022-12-15 ENCOUNTER — Other Ambulatory Visit: Payer: Self-pay

## 2022-12-24 ENCOUNTER — Other Ambulatory Visit: Payer: Self-pay

## 2022-12-24 ENCOUNTER — Other Ambulatory Visit (HOSPITAL_COMMUNITY): Payer: Self-pay

## 2022-12-27 DIAGNOSIS — Z23 Encounter for immunization: Secondary | ICD-10-CM | POA: Diagnosis not present

## 2023-01-03 DIAGNOSIS — G4733 Obstructive sleep apnea (adult) (pediatric): Secondary | ICD-10-CM | POA: Diagnosis not present

## 2023-01-03 DIAGNOSIS — Z23 Encounter for immunization: Secondary | ICD-10-CM | POA: Diagnosis not present

## 2023-01-08 ENCOUNTER — Other Ambulatory Visit: Payer: Self-pay | Admitting: Medical Genetics

## 2023-01-08 DIAGNOSIS — Z006 Encounter for examination for normal comparison and control in clinical research program: Secondary | ICD-10-CM

## 2023-01-09 ENCOUNTER — Other Ambulatory Visit: Payer: Self-pay

## 2023-01-09 ENCOUNTER — Other Ambulatory Visit (HOSPITAL_COMMUNITY)
Admission: RE | Admit: 2023-01-09 | Discharge: 2023-01-09 | Disposition: A | Discharge status: Home / Self Care | Payer: PPO | Source: Ambulatory Visit | Attending: Oncology | Admitting: Oncology

## 2023-01-09 DIAGNOSIS — Z006 Encounter for examination for normal comparison and control in clinical research program: Secondary | ICD-10-CM | POA: Insufficient documentation

## 2023-01-16 ENCOUNTER — Other Ambulatory Visit (HOSPITAL_COMMUNITY): Payer: Self-pay

## 2023-01-16 ENCOUNTER — Other Ambulatory Visit: Payer: Self-pay | Admitting: Cardiology

## 2023-01-16 MED ORDER — AMOXICILLIN 500 MG PO CAPS
2000.0000 mg | ORAL_CAPSULE | ORAL | 1 refills | Status: DC
  Filled 2023-01-16: qty 8, 2d supply, fill #0
  Filled 2023-01-22 – 2023-01-26 (×2): qty 8, 2d supply, fill #1

## 2023-01-19 DIAGNOSIS — G4733 Obstructive sleep apnea (adult) (pediatric): Secondary | ICD-10-CM | POA: Diagnosis not present

## 2023-01-19 LAB — HELIX MOLECULAR SCREEN: Genetic Analysis Overall Interpretation: NEGATIVE

## 2023-01-19 LAB — GENECONNECT MOLECULAR SCREEN

## 2023-01-22 ENCOUNTER — Other Ambulatory Visit: Payer: Self-pay

## 2023-01-22 ENCOUNTER — Other Ambulatory Visit (HOSPITAL_COMMUNITY): Payer: Self-pay

## 2023-01-26 ENCOUNTER — Other Ambulatory Visit (HOSPITAL_COMMUNITY): Payer: Self-pay

## 2023-01-29 DIAGNOSIS — E213 Hyperparathyroidism, unspecified: Secondary | ICD-10-CM | POA: Diagnosis not present

## 2023-01-30 ENCOUNTER — Ambulatory Visit (HOSPITAL_COMMUNITY): Payer: PPO | Attending: Internal Medicine

## 2023-01-30 DIAGNOSIS — I421 Obstructive hypertrophic cardiomyopathy: Secondary | ICD-10-CM | POA: Diagnosis not present

## 2023-01-30 LAB — ECHOCARDIOGRAM LIMITED
Area-P 1/2: 3.97 cm2
S' Lateral: 1.7 cm

## 2023-02-02 ENCOUNTER — Telehealth: Payer: Self-pay

## 2023-02-02 MED ORDER — MAVACAMTEN 10 MG PO CAPS: 10.0000 mg | ORAL_CAPSULE | Freq: Every day | ORAL | 0 refills | Status: DC

## 2023-02-02 NOTE — Telephone Encounter (Signed)
-----   Message from Christell Constant sent at 02/01/2023  3:28 PM EST ----- Results: LVEF 65% LVOT 68 mm Hg No medication changes in interval; had surgery for his hyperparathyroid - increase to 10 mg; repeat echo in 4 weeks then interval 12 weeks as planned   Christell Constant, MD

## 2023-02-02 NOTE — Telephone Encounter (Signed)
[facility] portal updated, script for mavacamten ([facility]) 10  mg PO every day sent to specialty pharmacy on file.

## 2023-02-03 DIAGNOSIS — G4733 Obstructive sleep apnea (adult) (pediatric): Secondary | ICD-10-CM | POA: Diagnosis not present

## 2023-02-06 ENCOUNTER — Ambulatory Visit: Payer: PPO | Attending: Internal Medicine | Admitting: Internal Medicine

## 2023-02-06 ENCOUNTER — Encounter: Payer: Self-pay | Admitting: Internal Medicine

## 2023-02-06 VITALS — BP 132/60 | HR 74 | Ht 68.0 in | Wt 213.0 lb

## 2023-02-06 DIAGNOSIS — Q211 Atrial septal defect, unspecified: Secondary | ICD-10-CM | POA: Diagnosis not present

## 2023-02-06 DIAGNOSIS — E782 Mixed hyperlipidemia: Secondary | ICD-10-CM

## 2023-02-06 DIAGNOSIS — I421 Obstructive hypertrophic cardiomyopathy: Secondary | ICD-10-CM | POA: Diagnosis not present

## 2023-02-06 NOTE — Telephone Encounter (Signed)
[facility] echo scheduled for 03/06/23 at 11 am.

## 2023-02-06 NOTE — Patient Instructions (Signed)
Medication Instructions:  Your physician recommends that you continue on your current medications as directed. Please refer to the Current Medication list given to you today.  *If you need a refill on your cardiac medications before your next appointment, please call your pharmacy*   Lab Work: NONE If you have labs (blood work) drawn today and your tests are completely normal, you will receive your results only by: MyChart Message (if you have MyChart) OR A paper copy in the mail If you have any lab test that is abnormal or we need to change your treatment, we will call you to review the results.   Testing/Procedures: DEC 13-21   Your physician has requested that you have an echocardiogram. Echocardiography is a painless test that uses sound waves to create images of your heart. It provides your doctor with information about the size and shape of your heart and how well your heart's chambers and valves are working. This procedure takes approximately one hour. There are no restrictions for this procedure. Please do NOT wear cologne, perfume, aftershave, or lotions (deodorant is allowed). Please arrive 15 minutes prior to your appointment time.  Please note: We ask at that you not bring children with you during ultrasound (echo/ vascular) testing. Due to room size and safety concerns, children are not allowed in the ultrasound rooms during exams. Our front office staff cannot provide observation of children in our lobby area while testing is being conducted. An adult accompanying a patient to their appointment will only be allowed in the ultrasound room at the discretion of the ultrasound technician under special circumstances. We apologize for any inconvenience.    Follow-Up: At [facility], you and your health needs are our priority.  As part of our continuing mission to provide you with exceptional heart care, we have created designated Provider Care Teams.  These Care Teams  include your primary Cardiologist (physician) and Advanced Practice Providers (APPs -  Physician Assistants and Nurse Practitioners) who all work together to provide you with the care you need, when you need it.   Your next appointment:   6 month(s)  Provider:   Riley Lam, MD

## 2023-02-06 NOTE — Progress Notes (Signed)
Cardiology Office Note:    Date:  02/06/2023   ID:  Alan Spencer, DOB 1956/03/16, MRN [id number]  PCP:  Garlan Fillers, MD   [facility] Providers Cardiologist:  Nanetta Batty, MD     Referring MD: Garlan Fillers, MD   CC: HCM follow up  History of Present Illness:    Alan Spencer is a 67 y.o. male with a hx of HCM and uncontrolled HTN who presents for evaluation 07/30/21. 2023: Since last visit we have tried to down titrate medications that would affect his LVOT gradient.  No change on MRAs.  Unable to come off diuretics for BP.  Was not a candidate for the Radiance trial (diastolic BP). 2024: started on mavacamten with NYHA II symptoms; Found to have ASD with RV dilation without dysfunction.  Had AF post ASE closure.  AF improved.  We weaned his BB and his fatigue improved.  Alan Spencer, with a history of secundum atrial septal defect (ASD) closure, right ventricular dilation without dysfunction, and obstructive hypertrophic cardiomyopathy, presents for a follow-up consultation. He reports an improvement in his New York Heart Association (NYHA) class from III to II. Recently, the patient's heart rate and outflow tract gradient increased, prompting an adjustment in his medication regimen. However, he has just started the increased dose of Mavacamten.  The patient also reports occasional numbness in his arms, particularly in the mornings. This symptom is suspected to be related to a neck issue, as confirmed by a recent x-ray. The patient has been managing this discomfort alongside his cardiac symptoms.  The patient has been on Eliquis for anticoagulation following his ASD closure and a brief episode of atrial fibrillation. He expresses concern about the bleeding risk associated with this medication, citing a recent minor bleeding event. However, he also acknowledges the potential stroke risk associated with his hypertrophic cardiomyopathy and previous atrial fibrillation.  The  patient is also grappling with the financial implications of his medication regimen, particularly the cost of Mavacamten. He expresses concern about the affordability of this medication, which is crucial for managing his obstructive hypertrophic cardiomyopathy. The patient is hopeful for potential changes in medication coverage in the upcoming year.   Past Medical History:  Diagnosis Date   Anxiety    Essential hypertension, benign    Excessive daytime sleepiness 07/03/2015   Family history of colon cancer    HNP (herniated nucleus pulposus), lumbar    L4-L5   Mixed hyperlipidemia    Obesity (BMI 30-39.9) 01/10/2018   Obstructive hypertrophic cardiomyopathy (HCC) 11/07/2021   Rectal bleeding    Resistant hypertension    Essential hypertension   Sleep apnea    uses CPAP    Past Surgical History:  Procedure Laterality Date   ATRIAL SEPTAL DEFECT(ASD) CLOSURE N/A 07/24/2022   Procedure: ATRIAL SEPTAL DEFECT(ASD) CLOSURE;  Surgeon: Tonny Bollman, MD;  Location: [facility] INVASIVE CV LAB;  Service: Cardiovascular;  Laterality: N/A;   broken left elbow     NASAL SEPTUM SURGERY  2022   PARATHYROIDECTOMY N/A 12/27/2020   Procedure: NECK EXPLORATION WITH BIOPSY OF PARATHYROID GLAND x3;  Surgeon: Darnell Level, MD;  Location: [facility] ORS;  Service: General;  Laterality: N/A;   TEE WITHOUT CARDIOVERSION N/A 06/13/2022   Procedure: TRANSESOPHAGEAL ECHOCARDIOGRAM (TEE);  Surgeon: Christell Constant, MD;  Location: [facility] ENDOSCOPY;  Service: Cardiovascular;  Laterality: N/A;   TONSILLECTOMY      Current Medications: No outpatient medications have been marked as taking for the 02/06/23 encounter (Appointment) with  Christell Constant, MD.     Allergies:   Patient has no known allergies.   Social History   Socioeconomic History   Marital status: Married    Spouse name: Not on file   Number of children: Not on file   Years of education: Not on file   Highest education level: Not on file   Occupational History   Not on file  Tobacco Use   Smoking status: Never   Smokeless tobacco: Never  Vaping Use   Vaping status: Never Used  Substance and Sexual Activity   Alcohol use: Yes    Comment: occas.   Drug use: No   Sexual activity: Not on file  Other Topics Concern   Not on file  Social History Narrative   Not on file   Social Determinants of Health   Financial Resource Strain: Low Risk  (11/07/2021)   Overall Financial Resource Strain (CARDIA)    Difficulty of Paying Living Expenses: Not hard at all  Food Insecurity: No Food Insecurity (11/07/2021)   Hunger Vital Sign    Worried About Running Out of Food in the Last Year: Never true    Ran Out of Food in the Last Year: Never true  Transportation Needs: Unknown (11/07/2021)   PRAPARE - Administrator, Civil Service (Medical): No    Lack of Transportation (Non-Medical): Not on file  Physical Activity: Sufficiently Active (11/07/2021)   Exercise Vital Sign    Days of Exercise per Week: 7 days    Minutes of Exercise per Session: 70 min  Recent Concern: Physical Activity - Insufficiently Active (11/07/2021)   Exercise Vital Sign    Days of Exercise per Week: 4 days    Minutes of Exercise per Session: 30 min  Stress: Not on file  Social Connections: Not on file     Family History: The patient's family history includes Colon cancer in his paternal grandmother; Hypertension in his mother; Hypertrophic cardiomyopathy in his mother; Kidney failure in his father.  ROS:   Please see the history of present illness.     EKGs/Labs/Other Studies Reviewed:    The following studies were reviewed today:  EKG:   03/22/21: Sinus bradycardia with 1st heart with LVH  Cardiac Studies & Procedures   CARDIAC CATHETERIZATION  CARDIAC CATHETERIZATION 07/24/2022  Narrative Successful transcatheter closure of a secundum atrial septal defect using an 18 mm Amplatzer septal occluder device under fluoroscopic and  intracardiac echo guidance  Recommendations: DAPT with aspirin and clopidogrel x 6 months as tolerated SBE prophylaxis when indicated x 6 months Limited 2D echocardiogram prior to hospital discharge, same-day discharge protocol if all criteria met     ECHOCARDIOGRAM  ECHOCARDIOGRAM LIMITED 01/30/2023  Narrative ECHOCARDIOGRAM LIMITED REPORT    Patient Name:   Alan Spencer Date of Exam: 01/30/2023 Medical Rec #:  [id number]     Height:       68.0 in Accession #:    [id number]    Weight:       207.0 lb Date of Birth:  Dec 25, 1955    BSA:          2.074 m Patient Age:    67 years      BP:           160/85 mmHg Patient Gender: M             HR:           71 bpm. Exam Location:  [facility]  Procedure: Limited Echo, Cardiac Doppler, Limited Color Doppler, 3D Echo and Strain Analysis  Indications:    I42.2 HOCM  History:        Patient has prior history of Echocardiogram examinations, most recent 11/07/2022. Risk Factors:Hypertension, Dyslipidemia and Sleep Apnea. Obesity. ASD. Elevated coronary calcium score. Hyperglycemia.  Sonographer:    Cathie Beams RCS Referring Phys: [id number] [facility] A Chinonso Linker  IMPRESSIONS   1. There is mild asymmetric left ventricular hypertrophy of the basal-septal segment. Left ventricular diastolic parameters are consistent with Grade I diastolic dysfunction (impaired relaxation). The average left ventricular global longitudinal strain is -19.0 %. The global longitudinal strain is normal. LV outflow tract gradient 18 mmHg at rest but increases to 68 mmHg with Valsalva. No mitral valve systolic anterior motion noted. 2. Right ventricular systolic function is normal. The right ventricular size is normal. There is normal pulmonary artery systolic pressure. The estimated right ventricular systolic pressure is 22.0 mmHg. 3. Left atrial size was mildly dilated. 4. There is an Amplatzer atrial septal closure device present, no residual shunt  visualized. 5. The mitral valve is normal in structure. Trivial mitral valve regurgitation. No evidence of mitral stenosis. 6. The aortic valve is tricuspid. There is moderate calcification of the aortic valve. Aortic valve regurgitation is not visualized. No aortic stenosis is present. 7. The inferior vena cava is normal in size with greater than 50% respiratory variability, suggesting right atrial pressure of 3 mmHg.  FINDINGS Left Ventricle: Left ventricular ejection fraction, by estimation, is 65 to 70%. The left ventricle has normal function. The left ventricle has no regional wall motion abnormalities. The average left ventricular global longitudinal strain is -19.0 %. The global longitudinal strain is normal. There is mild asymmetric left ventricular hypertrophy of the basal-septal segment. Left ventricular diastolic parameters are consistent with Grade I diastolic dysfunction (impaired relaxation).  Right Ventricle: The right ventricular size is normal. No increase in right ventricular wall thickness. Right ventricular systolic function is normal. There is normal pulmonary artery systolic pressure. The tricuspid regurgitant velocity is 2.18 m/s, and with an assumed right atrial pressure of 3 mmHg, the estimated right ventricular systolic pressure is 22.0 mmHg.  Left Atrium: Left atrial size was mildly dilated.  Right Atrium: Right atrial size was normal in size.  Pericardium: There is no evidence of pericardial effusion.  Mitral Valve: The mitral valve is normal in structure. Trivial mitral valve regurgitation. No evidence of mitral valve stenosis.  Aortic Valve: The aortic valve is tricuspid. There is moderate calcification of the aortic valve. Aortic valve regurgitation is not visualized. No aortic stenosis is present.  Pulmonic Valve: The pulmonic valve was normal in structure. Pulmonic valve regurgitation is trivial.  Aorta: The aortic root is normal in size and  structure.  Venous: The inferior vena cava is normal in size with greater than 50% respiratory variability, suggesting right atrial pressure of 3 mmHg.  IAS/Shunts: There is an Amplatzer atrial septal closure device present, no residual shunt visualized.  LEFT VENTRICLE PLAX 2D LVIDd:         3.80 cm   Diastology LVIDs:         1.70 cm   LV e' medial:    6.53 cm/s LV PW:         1.10 cm   LV E/e' medial:  12.4 LV IVS:        1.10 cm   LV e' lateral:   6.64 cm/s LVOT diam:     2.20 cm  LV E/e' lateral: 12.2 LV SV:         107 LV SV Index:   52        2D Longitudinal Strain LVOT Area:     3.80 cm  2D Strain GLS Avg:     -19.0 %  3D Volume EF: 3D EF:        65 % LV EDV:       96 ml LV ESV:       34 ml LV SV:        62 ml  RIGHT VENTRICLE RV S prime:     12.10 cm/s RVSP:           27.0 mmHg  LEFT ATRIUM             Index        RIGHT ATRIUM           Index LA diam:        4.50 cm 2.17 cm/m   RA Pressure: 8.00 mmHg LA Vol (A2C):   65.8 ml 31.73 ml/m  RA Area:     9.54 cm LA Vol (A4C):   66.3 ml 31.97 ml/m  RA Volume:   13.50 ml  6.51 ml/m LA Biplane Vol: 65.8 ml 31.73 ml/m AORTIC VALVE LVOT Vmax:   134.00 cm/s LVOT Vmean:  93.700 cm/s LVOT VTI:    0.282 m  AORTA Ao Root diam: 3.40 cm Ao Asc diam:  3.70 cm  MITRAL VALVE               TRICUSPID VALVE MV Area (PHT): 3.97 cm    TR Peak grad:   19.0 mmHg MV Decel Time: 191 msec    TR Vmax:        218.00 cm/s MV E velocity: 80.70 cm/s  Estimated RAP:  8.00 mmHg MV A velocity: 99.00 cm/s  RVSP:           27.0 mmHg MV E/A ratio:  0.82 SHUNTS Systemic VTI:  0.28 m Systemic Diam: 2.20 cm  Dalton McleanMD Electronically signed by Wilfred Lacy Signature Date/Time: 01/30/2023/12:55:17 PM    Final   TEE  ECHO TEE 06/13/2022  Narrative TRANSESOPHOGEAL ECHO REPORT    Patient Name:   Alan Spencer Date of Exam: 06/13/2022 Medical Rec #:  [id number]     Height:       68.0 in Accession #:    [id number]     Weight:       206.0 lb Date of Birth:  [birth year]    BSA:          2.070 m Patient Age:    66 years      BP:           100/50 mmHg Patient Gender: M             HR:           55 bpm. Exam Location:  Inpatient  Procedure: 3D Echo, Transesophageal Echo, Cardiac Doppler and Color Doppler  Indications:     Q21.1 ASD  History:         Patient has prior history of Echocardiogram examinations, most recent 05/26/2022. Signs/Symptoms:Dyspnea and Shortness of Breath; Risk Factors:Sleep Apnea and Dyslipidemia. ASD. HOCM. Chordal SAM.  Sonographer:     Sheralyn Boatman RDCS Referring Phys:  [id number] [facility] A Izora Ribas Diagnosing Phys: Riley Lam MD  PROCEDURE: After discussion of the risks and benefits of a TEE, an informed consent was obtained from the  patient. The transesophogeal probe was passed without difficulty through the esophogus of the patient. Imaged were obtained with the patient in a left lateral decubitus position. Sedation performed by different physician. The patient was monitored while under deep sedation. Anesthestetic sedation was provided intravenously by Anesthesiology: 155mg  of Propofol. The patient's vital signs; including heart rate, blood pressure, and oxygen saturation; remained stable throughout the procedure. The patient developed no complications during the procedure. Transgastric views not obtained.  IMPRESSIONS   1. Evidence of atrial level shunting detected by color flow Doppler. There is a moderately sized secundum atrial septal defect with predominantly left to right shunting across the atrial septum. Largest dimensions 2.39 cm by 2.22 cm. Smallest rims are anterior (5 mm) and superior (6 mm). Sufficient dimensions for an Amplatzer ASD- 024 device. 2. Chordal SAM with no significant LVOT gradient.. Left ventricular ejection fraction, by estimation, is 65 to 70%. The left ventricle has normal function. There is severe asymmetric left ventricular hypertrophy of  the septal segment. 3. Right ventricular systolic function is normal. The right ventricular size is moderately enlarged. 4. No left atrial/left atrial appendage thrombus was detected. 5. The mitral valve is normal in structure. Mild mitral valve regurgitation. No evidence of mitral stenosis. 6. The aortic valve is tricuspid. Aortic valve regurgitation is not visualized. No aortic stenosis is present.  FINDINGS Left Ventricle: Chordal SAM with no significant LVOT gradient. Left ventricular ejection fraction, by estimation, is 65 to 70%. The left ventricle has normal function. The left ventricular internal cavity size was normal in size. There is severe asymmetric left ventricular hypertrophy of the septal segment.  Right Ventricle: The right ventricular size is moderately enlarged. Right vetricular wall thickness was not well visualized. Right ventricular systolic function is normal.  Left Atrium: Left atrial size was normal in size. No left atrial/left atrial appendage thrombus was detected.  Right Atrium: Right atrial size was normal in size.  Pericardium: There is no evidence of pericardial effusion.  Mitral Valve: The mitral valve is normal in structure. Mild mitral valve regurgitation. No evidence of mitral valve stenosis.  Tricuspid Valve: The tricuspid valve is normal in structure. Tricuspid valve regurgitation is not demonstrated. No evidence of tricuspid stenosis.  Aortic Valve: The aortic valve is tricuspid. Aortic valve regurgitation is not visualized. No aortic stenosis is present.  Pulmonic Valve: The pulmonic valve was not well visualized. Pulmonic valve regurgitation is not visualized. No evidence of pulmonic stenosis.  Aorta: The aortic root and ascending aorta are structurally normal, with no evidence of dilitation.  IAS/Shunts: Evidence of atrial level shunting detected by color flow Doppler. There is a moderately sized secundum atrial septal defect with predominantly  left to right shunting across the atrial septum.  Additional Comments: Spectral Doppler performed.  Riley Lam MD Electronically signed by Riley Lam MD Signature Date/Time: 06/13/2022/4:51:57 PM    Final   MONITORS  LONG TERM MONITOR (3-14 DAYS) 08/25/2022  Narrative   Patient had a minimum heart rate of 28 bpm, maximum heart rate of 108 bpm, and average heart rate of 51 bpm.   Predominant underlying rhythm was sinus rhythm.   Five sinus pauses noted; one of which was during the day, longest lasting 3.8 seconds, no device triggering.  Asymptomatic bradycardia without atrial fibrillation.   CT SCANS  CT CORONARY MORPH W/CTA COR W/SCORE 04/26/2021  Addendum 04/26/2021 12:53 PM ADDENDUM REPORT: 04/26/2021 12:51  CLINICAL DATA:  Dyspnea with coronary risk factors  EXAM: Cardiac CTA  MEDICATIONS: Sub lingual  nitro. 4 mg and lopressor 100mg   TECHNIQUE: The patient was scanned on a [employer] 192 scanner. Gantry rotation speed was 250 msecs. Collimation was. 6 mm . A 120 kV prospective scan was triggered in the ascending thoracic aorta at 140 HU's with full mA between 30-70% of the R-R interval . Average HR during the scan was 45 bpm. The 3D data set was interpreted on a dedicated work station using MPR, MIP and VRT modes. A total of 80 cc of contrast was used.  FINDINGS: Non-cardiac: See separate report from [facility]. No significant findings on limited lung and soft tissue windows.  Calcium score: Mild 2 vessel calcium noted  LM 0  LAD 0.305  LCX 0  RCA 39.5  Total: 39.8 which is 41 st percentile for age/sex  Coronary Arteries: Right dominant with no anomalies  LM: Normal  LAD: 1-24% calcified plaque in mid vessel  D1: Normal  D2: Normal  D3 Normal  Circumflex: Normal  OM1: Normal  OM2: Normal  OM3: Normal  RCA: 1-24% calcified plaque in proximal, mid and distal vessel  PDA: Normal  PLA:  Normal  IMPRESSION: 1. Calcium score 39.8 which is 41 st percentile for age/sex  2.  Normal ascending thoracic aorta 3.6 cm  3.  CAD RADS 1 non obstructive CAD see description above  Charlton Haws   Electronically Signed By: Charlton Haws M.D. On: 04/26/2021 12:51  Narrative EXAM: OVER-READ INTERPRETATION  CT CHEST  The following report is an over-read performed by radiologist Dr. Trudie Reed of [facility] on 04/26/2021. This over-read does not include interpretation of cardiac or coronary anatomy or pathology. The coronary calcium score/coronary CTA interpretation by the cardiologist is attached.  COMPARISON:  No priors.  FINDINGS: Atherosclerotic calcifications in the thoracic aorta. Within the visualized portions of the thorax there are no suspicious appearing pulmonary nodules or masses, there is no acute consolidative airspace disease, no pleural effusions, no pneumothorax and no lymphadenopathy. Visualized portions of the upper abdomen are unremarkable. There are no aggressive appearing lytic or blastic lesions noted in the visualized portions of the skeleton.  IMPRESSION: 1.  Aortic Atherosclerosis (ICD10-I70.0).  Electronically Signed: By: Trudie Reed M.D. On: 04/26/2021 12:04   CARDIAC MRI  Alan CARDIAC MORPHOLOGY W WO CONTRAST 09/04/2021  Narrative CLINICAL DATA:  Clinical question of hypertrophic cardiomyopathy Study assumes HCT of 45 and BSA of 2.12 m2.  EXAM: CARDIAC MRI  TECHNIQUE: The patient was scanned on a 1.5 Tesla GE magnet. A dedicated cardiac coil was used. Functional imaging was done using Fiesta sequences. 2,3, and 4 chamber views were done to assess for RWMA's. Modified Simpson's rule using a short axis stack was used to calculate an ejection fraction on a dedicated work [employer]. The patient received 10 cc of Gadavist. After 10 minutes inversion recovery sequences were used to assess  for infiltration and scar tissue.  CONTRAST:  10 cc  of Gadavist  FINDINGS: 1. Normal left ventricular size, with LVEDD 46 mm, and LVEDVi 46 mL/m2.  Normal left ventricular thickness, with intraventricular septal thickness of 15 mm, posterior wall thickness of 10 mm, though myocardial mass index normal for gender at 65 g/m2.  Normal left ventricular systolic function (LVEF =63%). There are no regional wall motion abnormalities. There is late peaking chordal systolic motion of the anterior mitral valve (chordal SAM).  Left ventricular parametric mapping notable for normal ECV signal and normal T2 signal.  There is no late gadolinium  enhancement in the left ventricular myocardium. Six standard deviation reference used.  2. Normal right ventricular size with RVEDVI 87 mL/m2.  Normal right ventricular thickness.  Normal right ventricular systolic function (RVEF =52%). There are no regional wall motion abnormalities or aneurysms.  3.  Normal left and right atrial size.  4. Normal size of the aortic root, ascending aorta and pulmonary artery.  5. Valve assessment:  Aortic Valve: Tri-leaflet aortic valve. Regurgitant fraction 14%, mean gradient of 1 mm Hg. Mild central aortic regurgitation.  Pulmonic Valve: Regurgitant fraction 2%, mean gradient < 1 mm Hg. Qualitatively, there is no significant regurgitation.  Tricuspid Valve: Regurgitant fraction 10%. Mild central tricuspid regurgitation  Mitral Valve: Regurgitant fraction 16%.  Mild mitral regurgitation.  6.  Normal pericardium.  No pericardial effusion.  7. Grossly, no extracardiac findings. Recommended dedicated study if concerned for non-cardiac pathology.  IMPRESSION: In absence of system disease leading to hypertrophy, no evidence of infiltrative disease and consistent with hypertrophic cardiomyopathy.  There is qualitatively turbulent LVOT flow often seen in hypertrophic cardiomyopathy.  No evidence of  scar.  Riley Lam MD   Electronically Signed By: Riley Lam M.D. On: 09/05/2021 07:37           Recent Labs: 07/04/2022: BUN 36; Creatinine, Ser 1.37; Hemoglobin 14.7; Platelets 192; Potassium 4.1; Sodium 139  Recent Lipid Panel No results found for: "CHOL", "TRIG", "HDL", "CHOLHDL", "VLDL", "LDLCALC", "LDLDIRECT"        Physical Exam:    VS:  There were no vitals taken for this visit.    Wt Readings from Last 3 Encounters:  11/25/22 207 lb (93.9 kg)  10/08/22 215 lb (97.5 kg)  08/25/22 205 lb 3.2 oz (93.1 kg)    Gen: No distress Neck: No JVD Cardiac: No Rubs or Gallops, holosystolic murmur, regular bradycardia, +2 radial pulses Respiratory: Clear to auscultation bilaterally, normal effort, normal  respiratory rate GI: Soft, nontender, non-distended  MS: No leg edema;  moves all extremities Integument: Skin feels warm Neuro:  At time of evaluation, alert and oriented to person/place/time/situation  Psych: Normal affect, patient feels well   ASSESSMENT:    No diagnosis found.    PLAN:    Obstructive Hypertrophic Cardiomyopathy - Patient with obstructive hypertrophic cardiomyopathy on Mavacamten, improved from NYHA class III to II. Discussed high cost and potential future availability of other cardiac myosin inhibitors. [facility] at [facility] considered if medication becomes unaffordable or if patient prefers discontinuation. Reviewed Valor trial ata. Patient prefers to continue medication. - Continue Mavacamten 10 mg daily (NYHA class II; continue BB, needs 4 week then 12 week echo) - Monitor for symptom changes and side effects - Discuss surgical options if medication becomes unaffordable or ineffective - Follow up in spring/summer 2025 - at this visit will assess SCD risk again  Paroxysmal Atrial Fibrillation,  - CHADSVASC=NA in the setting of HCM. Atrial Septal Defect (ASD) Post-Closure - Secundum ASD closed,  experienced atrial fibrillation within six months post-closure, now resolved. Discussed risks and benefits of discontinuing Eliquis. Given hypertrophic cardiomyopathy, higher stroke risk. No significant AFib episodes post-closure. Discussed stopping Eliquis with monitoring, continuing for a year, or indefinite anticoagulation. Patient prefers to continue Eliquis. - Continue Eliquis for anticoagulation - Monitor for atrial fibrillation using Apple Watch - Consider discontinuing Eliquis if no AFib episodes by spring/summer 2025 (we discussed the risks and benefits given this may just be related to AF post ASD closure 07/2022 but that he also may have this due  to HCM and his stroke risk would be higher; no interest in ILR) - Follow up in spring/summer 2025  HLD (TGS) Elevated CAC score - continue current medications - at next visit will discuss general CAD maintenance  ASD - with RV dilation and without dysfunction - s/p closure with good result  Summer f/u with me  Time Spent Directly with Patient:   I have spent a total of 40 minutes with the patient reviewing notes, imaging, EKGs, labs, and examining the patient as well as establishing an assessment and plan that was discussed personally with the patient. Discussed surgical options and unique stroke risks due to ASD closure and due to HCM; will remain on [facility]      Medication Adjustments/Labs and Tests Ordered: Current medicines are reviewed at length with the patient today.  Concerns regarding medicines are outlined above.  No orders of the defined types were placed in this encounter.  No orders of the defined types were placed in this encounter.   There are no Patient Instructions on file for this visit.   Signed, Christell Constant, MD  02/06/2023 9:13 AM    [facility]

## 2023-02-09 ENCOUNTER — Other Ambulatory Visit: Payer: Self-pay

## 2023-02-10 DIAGNOSIS — E213 Hyperparathyroidism, unspecified: Secondary | ICD-10-CM | POA: Diagnosis not present

## 2023-02-10 DIAGNOSIS — D351 Benign neoplasm of parathyroid gland: Secondary | ICD-10-CM | POA: Diagnosis not present

## 2023-02-16 ENCOUNTER — Other Ambulatory Visit (HOSPITAL_COMMUNITY): Payer: Self-pay

## 2023-02-20 ENCOUNTER — Other Ambulatory Visit (HOSPITAL_COMMUNITY): Payer: Self-pay | Admitting: Internal Medicine

## 2023-02-20 ENCOUNTER — Ambulatory Visit (HOSPITAL_COMMUNITY)
Admission: RE | Admit: 2023-02-20 | Discharge: 2023-02-20 | Disposition: A | Discharge status: Home / Self Care | Payer: PPO | Source: Ambulatory Visit | Attending: Vascular Surgery

## 2023-02-20 DIAGNOSIS — I6522 Occlusion and stenosis of left carotid artery: Secondary | ICD-10-CM | POA: Diagnosis not present

## 2023-02-27 ENCOUNTER — Encounter (HOSPITAL_COMMUNITY): Payer: PPO

## 2023-03-04 ENCOUNTER — Other Ambulatory Visit: Payer: Self-pay | Admitting: Internal Medicine

## 2023-03-05 DIAGNOSIS — G4733 Obstructive sleep apnea (adult) (pediatric): Secondary | ICD-10-CM | POA: Diagnosis not present

## 2023-03-06 ENCOUNTER — Ambulatory Visit (HOSPITAL_COMMUNITY)
Admission: RE | Admit: 2023-03-06 | Discharge: 2023-03-06 | Disposition: A | Discharge status: Home / Self Care | Payer: PPO | Source: Ambulatory Visit | Attending: Internal Medicine | Admitting: Internal Medicine

## 2023-03-06 DIAGNOSIS — I421 Obstructive hypertrophic cardiomyopathy: Secondary | ICD-10-CM | POA: Insufficient documentation

## 2023-03-06 LAB — ECHOCARDIOGRAM LIMITED
AR max vel: 2.96 cm2
AV Area VTI: 3.35 cm2
AV Area mean vel: 2.96 cm2
AV Mean grad: 4 mm[Hg]
AV Peak grad: 6.9 mm[Hg]
Ao pk vel: 1.31 m/s
MV M vel: 2.54 m/s
MV Peak grad: 25.8 mm[Hg]

## 2023-03-08 ENCOUNTER — Telehealth: Payer: Self-pay | Admitting: Internal Medicine

## 2023-03-08 DIAGNOSIS — I421 Obstructive hypertrophic cardiomyopathy: Secondary | ICD-10-CM

## 2023-03-08 NOTE — Telephone Encounter (Signed)
Called Patient Discussed that he is feeling well LVEF 55-60%, LVOT gradient is 9 mm Hg at greatest  Patient notes that he is doing better.   Since last visit notes no new medications . There are no interval hospital/ED visit.   No further AF on his Apple Watch.    No chest pain or pressure .  No SOB/DOE and no PND/Orthopnea.  No weight gain or leg swelling.  No palpitations or syncope .  His stamina is better on 10 mg and lowest BB dose.  He can play tennis more.  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 Marisa Severin, MD Cardiologist [facility]  [address] [place], [address] [place], [place] [address] [phone]  4:02 PM

## 2023-03-09 MED ORDER — MAVACAMTEN 10 MG PO CAPS: ORAL_CAPSULE | ORAL | 1 refills | Status: DC

## 2023-03-09 NOTE — Telephone Encounter (Signed)
PSF updated refill of mavacamten 10 mg 35 with 1 refill placed to specialty pharmacy on file.

## 2023-03-09 NOTE — Addendum Note (Signed)
Addended by: Macie Burows on: 03/09/2023 04:30 PM   Modules accepted: Orders

## 2023-03-10 ENCOUNTER — Other Ambulatory Visit: Payer: Self-pay

## 2023-03-11 ENCOUNTER — Encounter: Payer: Self-pay | Admitting: Internal Medicine

## 2023-03-12 NOTE — Addendum Note (Signed)
Addended by: Macie Burows on: 03/12/2023 12:44 PM   Modules accepted: Orders

## 2023-03-20 DIAGNOSIS — Z01818 Encounter for other preprocedural examination: Secondary | ICD-10-CM | POA: Diagnosis not present

## 2023-03-20 DIAGNOSIS — I428 Other cardiomyopathies: Secondary | ICD-10-CM | POA: Diagnosis not present

## 2023-03-20 DIAGNOSIS — K219 Gastro-esophageal reflux disease without esophagitis: Secondary | ICD-10-CM | POA: Diagnosis not present

## 2023-03-20 DIAGNOSIS — E213 Hyperparathyroidism, unspecified: Secondary | ICD-10-CM | POA: Diagnosis not present

## 2023-03-20 DIAGNOSIS — I480 Paroxysmal atrial fibrillation: Secondary | ICD-10-CM | POA: Diagnosis not present

## 2023-03-20 DIAGNOSIS — I2510 Atherosclerotic heart disease of native coronary artery without angina pectoris: Secondary | ICD-10-CM | POA: Diagnosis not present

## 2023-03-20 DIAGNOSIS — G4733 Obstructive sleep apnea (adult) (pediatric): Secondary | ICD-10-CM | POA: Diagnosis not present

## 2023-03-20 DIAGNOSIS — Z7901 Long term (current) use of anticoagulants: Secondary | ICD-10-CM | POA: Diagnosis not present

## 2023-03-26 ENCOUNTER — Other Ambulatory Visit (HOSPITAL_BASED_OUTPATIENT_CLINIC_OR_DEPARTMENT_OTHER): Payer: Self-pay

## 2023-03-26 ENCOUNTER — Other Ambulatory Visit (HOSPITAL_COMMUNITY): Payer: Self-pay

## 2023-03-26 ENCOUNTER — Other Ambulatory Visit: Payer: Self-pay

## 2023-03-26 MED ORDER — ELIQUIS 5 MG PO TABS
5.0000 mg | ORAL_TABLET | Freq: Two times a day (BID) | ORAL | 3 refills | Status: DC
  Filled 2023-03-26: qty 60, 30d supply, fill #0
  Filled 2023-04-20: qty 60, 30d supply, fill #1
  Filled 2023-06-01: qty 60, 30d supply, fill #2
  Filled 2023-06-27: qty 60, 30d supply, fill #3

## 2023-03-27 DIAGNOSIS — D351 Benign neoplasm of parathyroid gland: Secondary | ICD-10-CM | POA: Diagnosis not present

## 2023-03-27 DIAGNOSIS — E210 Primary hyperparathyroidism: Secondary | ICD-10-CM | POA: Diagnosis not present

## 2023-04-05 DIAGNOSIS — G4733 Obstructive sleep apnea (adult) (pediatric): Secondary | ICD-10-CM | POA: Diagnosis not present

## 2023-04-06 ENCOUNTER — Other Ambulatory Visit: Payer: Self-pay

## 2023-04-08 ENCOUNTER — Other Ambulatory Visit (HOSPITAL_COMMUNITY): Payer: Self-pay

## 2023-04-08 DIAGNOSIS — I421 Obstructive hypertrophic cardiomyopathy: Secondary | ICD-10-CM | POA: Diagnosis not present

## 2023-04-08 DIAGNOSIS — I10 Essential (primary) hypertension: Secondary | ICD-10-CM | POA: Diagnosis not present

## 2023-04-08 DIAGNOSIS — Z6832 Body mass index (BMI) 32.0-32.9, adult: Secondary | ICD-10-CM | POA: Diagnosis not present

## 2023-04-08 DIAGNOSIS — G4733 Obstructive sleep apnea (adult) (pediatric): Secondary | ICD-10-CM | POA: Diagnosis not present

## 2023-04-08 DIAGNOSIS — E66811 Obesity, class 1: Secondary | ICD-10-CM | POA: Diagnosis not present

## 2023-04-08 DIAGNOSIS — R7302 Impaired glucose tolerance (oral): Secondary | ICD-10-CM | POA: Diagnosis not present

## 2023-04-08 MED ORDER — ZEPBOUND 2.5 MG/0.5ML ~~LOC~~ SOAJ
2.5000 mg | SUBCUTANEOUS | 0 refills | Status: DC
  Filled 2023-04-08 – 2023-09-25 (×6): qty 2, 28d supply, fill #0

## 2023-04-08 MED ORDER — ZEPBOUND 5 MG/0.5ML ~~LOC~~ SOAJ
5.0000 mg | SUBCUTANEOUS | 5 refills | Status: DC
  Filled 2023-04-08 – 2023-04-20 (×2): qty 2, 28d supply, fill #0

## 2023-04-14 ENCOUNTER — Other Ambulatory Visit (HOSPITAL_COMMUNITY): Payer: Self-pay

## 2023-04-14 ENCOUNTER — Other Ambulatory Visit (HOSPITAL_BASED_OUTPATIENT_CLINIC_OR_DEPARTMENT_OTHER): Payer: Self-pay

## 2023-04-14 ENCOUNTER — Other Ambulatory Visit: Payer: Self-pay

## 2023-04-14 MED ORDER — VITAMIN D3 125 MCG (5000 UT) PO CAPS
5000.0000 [IU] | ORAL_CAPSULE | ORAL | 3 refills | Status: AC
  Filled 2023-04-14: qty 24, 84d supply, fill #0
  Filled 2023-07-03: qty 24, 84d supply, fill #1
  Filled 2023-09-25: qty 24, 84d supply, fill #2
  Filled 2023-12-27: qty 24, 84d supply, fill #3

## 2023-04-20 ENCOUNTER — Other Ambulatory Visit (HOSPITAL_COMMUNITY): Payer: Self-pay

## 2023-04-20 ENCOUNTER — Other Ambulatory Visit: Payer: Self-pay

## 2023-04-20 ENCOUNTER — Encounter: Payer: Self-pay | Admitting: Pharmacist

## 2023-04-20 MED ORDER — AMLODIPINE BESYLATE 10 MG PO TABS
10.0000 mg | ORAL_TABLET | Freq: Every day | ORAL | 3 refills | Status: DC
  Filled 2023-04-20: qty 90, 90d supply, fill #0
  Filled 2023-07-21: qty 90, 90d supply, fill #1
  Filled 2023-10-18: qty 90, 90d supply, fill #2
  Filled 2024-01-12: qty 90, 90d supply, fill #3

## 2023-04-21 DIAGNOSIS — I491 Atrial premature depolarization: Secondary | ICD-10-CM | POA: Diagnosis not present

## 2023-04-21 DIAGNOSIS — I440 Atrioventricular block, first degree: Secondary | ICD-10-CM | POA: Diagnosis not present

## 2023-04-21 DIAGNOSIS — Z01810 Encounter for preprocedural cardiovascular examination: Secondary | ICD-10-CM | POA: Diagnosis not present

## 2023-04-22 DIAGNOSIS — E213 Hyperparathyroidism, unspecified: Secondary | ICD-10-CM | POA: Diagnosis not present

## 2023-04-22 DIAGNOSIS — E785 Hyperlipidemia, unspecified: Secondary | ICD-10-CM | POA: Diagnosis not present

## 2023-04-22 DIAGNOSIS — I10 Essential (primary) hypertension: Secondary | ICD-10-CM | POA: Diagnosis not present

## 2023-04-24 ENCOUNTER — Ambulatory Visit (HOSPITAL_COMMUNITY): Payer: PPO | Attending: Internal Medicine

## 2023-04-24 DIAGNOSIS — I421 Obstructive hypertrophic cardiomyopathy: Secondary | ICD-10-CM | POA: Diagnosis not present

## 2023-04-24 LAB — ECHOCARDIOGRAM LIMITED
AR max vel: 3.88 cm2
AV Area VTI: 3.85 cm2
AV Area mean vel: 3.75 cm2
AV Mean grad: 3.5 mm[Hg]
AV Peak grad: 6.1 mm[Hg]
Ao pk vel: 1.24 m/s
Area-P 1/2: 3.02 cm2
S' Lateral: 2.4 cm

## 2023-04-26 ENCOUNTER — Other Ambulatory Visit: Payer: Self-pay

## 2023-04-27 ENCOUNTER — Encounter: Payer: Self-pay | Admitting: Internal Medicine

## 2023-04-27 DIAGNOSIS — I421 Obstructive hypertrophic cardiomyopathy: Secondary | ICD-10-CM

## 2023-04-28 ENCOUNTER — Other Ambulatory Visit: Payer: Self-pay

## 2023-04-28 ENCOUNTER — Other Ambulatory Visit: Payer: Self-pay | Admitting: Cardiology

## 2023-04-28 ENCOUNTER — Other Ambulatory Visit (HOSPITAL_COMMUNITY): Payer: Self-pay

## 2023-04-28 MED ORDER — MAVACAMTEN 10 MG PO CAPS: ORAL_CAPSULE | ORAL | 2 refills | Status: DC

## 2023-04-28 MED ORDER — AMOXICILLIN 500 MG PO CAPS
2000.0000 mg | ORAL_CAPSULE | ORAL | 2 refills | Status: AC
  Filled 2023-04-28: qty 8, 2d supply, fill #0
  Filled 2023-05-05: qty 8, 2d supply, fill #2
  Filled 2023-05-05: qty 8, 2d supply, fill #1

## 2023-04-28 NOTE — Telephone Encounter (Signed)
PSF updated on [facility] portal and refilled mavacamten 10 mg every day.

## 2023-05-01 ENCOUNTER — Other Ambulatory Visit: Payer: Self-pay

## 2023-05-01 ENCOUNTER — Other Ambulatory Visit (HOSPITAL_COMMUNITY): Payer: Self-pay

## 2023-05-01 MED ORDER — MAXITROL 3.5-10000-0.1 OP OINT
1.0000 | TOPICAL_OINTMENT | Freq: Every evening | OPHTHALMIC | 1 refills | Status: AC
  Filled 2023-05-01: qty 7, 7d supply, fill #0
  Filled 2023-05-05: qty 7, 7d supply, fill #1

## 2023-05-05 ENCOUNTER — Other Ambulatory Visit (HOSPITAL_COMMUNITY): Payer: Self-pay

## 2023-05-06 ENCOUNTER — Other Ambulatory Visit: Payer: Self-pay

## 2023-05-06 DIAGNOSIS — G4733 Obstructive sleep apnea (adult) (pediatric): Secondary | ICD-10-CM | POA: Diagnosis not present

## 2023-05-07 ENCOUNTER — Other Ambulatory Visit (HOSPITAL_COMMUNITY): Payer: Self-pay

## 2023-05-07 ENCOUNTER — Other Ambulatory Visit: Payer: Self-pay

## 2023-05-07 MED ORDER — METOPROLOL SUCCINATE ER 25 MG PO TB24
25.0000 mg | ORAL_TABLET | Freq: Every day | ORAL | 3 refills | Status: DC
  Filled 2023-05-07: qty 90, 90d supply, fill #0
  Filled 2023-05-07 – 2023-07-31 (×2): qty 90, 90d supply, fill #1
  Filled 2023-10-29: qty 90, 90d supply, fill #2
  Filled 2024-01-27: qty 90, 90d supply, fill #3

## 2023-05-22 DIAGNOSIS — D485 Neoplasm of uncertain behavior of skin: Secondary | ICD-10-CM | POA: Diagnosis not present

## 2023-05-22 DIAGNOSIS — L218 Other seborrheic dermatitis: Secondary | ICD-10-CM | POA: Diagnosis not present

## 2023-05-22 DIAGNOSIS — Q828 Other specified congenital malformations of skin: Secondary | ICD-10-CM | POA: Diagnosis not present

## 2023-05-22 DIAGNOSIS — L570 Actinic keratosis: Secondary | ICD-10-CM | POA: Diagnosis not present

## 2023-05-22 DIAGNOSIS — L814 Other melanin hyperpigmentation: Secondary | ICD-10-CM | POA: Diagnosis not present

## 2023-05-22 DIAGNOSIS — L603 Nail dystrophy: Secondary | ICD-10-CM | POA: Diagnosis not present

## 2023-05-22 DIAGNOSIS — D225 Melanocytic nevi of trunk: Secondary | ICD-10-CM | POA: Diagnosis not present

## 2023-06-01 ENCOUNTER — Other Ambulatory Visit (HOSPITAL_COMMUNITY): Payer: Self-pay

## 2023-06-01 ENCOUNTER — Other Ambulatory Visit: Payer: Self-pay

## 2023-06-01 MED ORDER — IRBESARTAN-HYDROCHLOROTHIAZIDE 300-12.5 MG PO TABS
1.0000 | ORAL_TABLET | Freq: Every day | ORAL | 3 refills | Status: AC
  Filled 2023-06-01: qty 90, 90d supply, fill #0
  Filled 2023-08-29: qty 90, 90d supply, fill #1
  Filled 2023-11-26: qty 90, 90d supply, fill #2
  Filled 2024-02-22 (×2): qty 90, 90d supply, fill #3

## 2023-06-03 DIAGNOSIS — G4733 Obstructive sleep apnea (adult) (pediatric): Secondary | ICD-10-CM | POA: Diagnosis not present

## 2023-06-17 ENCOUNTER — Other Ambulatory Visit: Payer: Self-pay

## 2023-06-17 ENCOUNTER — Other Ambulatory Visit (HOSPITAL_COMMUNITY): Payer: Self-pay

## 2023-06-17 ENCOUNTER — Other Ambulatory Visit: Payer: Self-pay | Admitting: Cardiovascular Disease

## 2023-06-17 MED ORDER — SPIRONOLACTONE 25 MG PO TABS
25.0000 mg | ORAL_TABLET | Freq: Every day | ORAL | 0 refills | Status: DC
  Filled 2023-06-17: qty 90, 90d supply, fill #0

## 2023-06-24 DIAGNOSIS — G4733 Obstructive sleep apnea (adult) (pediatric): Secondary | ICD-10-CM | POA: Diagnosis not present

## 2023-06-27 ENCOUNTER — Other Ambulatory Visit (HOSPITAL_COMMUNITY): Payer: Self-pay

## 2023-07-03 ENCOUNTER — Other Ambulatory Visit (HOSPITAL_COMMUNITY): Payer: Self-pay

## 2023-07-20 ENCOUNTER — Other Ambulatory Visit (HOSPITAL_COMMUNITY): Payer: Self-pay

## 2023-07-20 ENCOUNTER — Other Ambulatory Visit: Payer: Self-pay

## 2023-07-20 MED ORDER — ROSUVASTATIN CALCIUM 20 MG PO TABS
20.0000 mg | ORAL_TABLET | Freq: Every day | ORAL | 3 refills | Status: AC
  Filled 2023-07-20: qty 90, 90d supply, fill #0
  Filled 2023-10-20: qty 90, 90d supply, fill #1
  Filled 2024-01-18: qty 90, 90d supply, fill #2
  Filled 2024-04-12: qty 90, 90d supply, fill #3

## 2023-07-21 ENCOUNTER — Other Ambulatory Visit (HOSPITAL_COMMUNITY): Payer: Self-pay

## 2023-07-24 ENCOUNTER — Ambulatory Visit (HOSPITAL_COMMUNITY): Payer: PPO | Attending: Cardiology

## 2023-07-24 DIAGNOSIS — I421 Obstructive hypertrophic cardiomyopathy: Secondary | ICD-10-CM | POA: Diagnosis not present

## 2023-07-24 LAB — ECHOCARDIOGRAM LIMITED
Area-P 1/2: 3.48 cm2
S' Lateral: 2.75 cm

## 2023-07-25 ENCOUNTER — Encounter: Payer: Self-pay | Admitting: Internal Medicine

## 2023-07-25 DIAGNOSIS — I421 Obstructive hypertrophic cardiomyopathy: Secondary | ICD-10-CM

## 2023-07-26 ENCOUNTER — Other Ambulatory Visit (HOSPITAL_COMMUNITY): Payer: Self-pay

## 2023-07-27 ENCOUNTER — Other Ambulatory Visit (HOSPITAL_COMMUNITY): Payer: Self-pay

## 2023-07-27 ENCOUNTER — Other Ambulatory Visit: Payer: Self-pay | Admitting: Internal Medicine

## 2023-07-27 DIAGNOSIS — M542 Cervicalgia: Secondary | ICD-10-CM

## 2023-07-27 DIAGNOSIS — M5412 Radiculopathy, cervical region: Secondary | ICD-10-CM

## 2023-07-27 MED ORDER — MAVACAMTEN 10 MG PO CAPS: 10.0000 mg | ORAL_CAPSULE | Freq: Every day | ORAL | 5 refills | Status: DC

## 2023-07-27 MED ORDER — ELIQUIS 5 MG PO TABS
5.0000 mg | ORAL_TABLET | Freq: Two times a day (BID) | ORAL | 3 refills | Status: AC
  Filled 2023-07-27: qty 180, 90d supply, fill #0
  Filled 2023-10-25: qty 180, 90d supply, fill #1
  Filled 2024-01-20: qty 180, 90d supply, fill #2
  Filled 2024-04-20: qty 180, 90d supply, fill #3

## 2023-07-27 NOTE — Telephone Encounter (Signed)
-----   Message from Jann Melody sent at 07/25/2023  2:54 PM EDT ----- Results: Peak LVOT Gradient: 2 LVEF : 60% Plan: Mavacamten  dose 10 mg If Patient has new medications or supplements, will need medication review for potential interactions  Jann Melody, MD

## 2023-07-29 ENCOUNTER — Ambulatory Visit (HOSPITAL_COMMUNITY)
Admission: RE | Admit: 2023-07-29 | Discharge: 2023-07-29 | Disposition: A | Discharge status: Home / Self Care | Source: Ambulatory Visit | Attending: Internal Medicine | Admitting: Internal Medicine

## 2023-07-29 ENCOUNTER — Other Ambulatory Visit (HOSPITAL_COMMUNITY): Payer: Self-pay | Admitting: Internal Medicine

## 2023-07-29 DIAGNOSIS — M4802 Spinal stenosis, cervical region: Secondary | ICD-10-CM | POA: Diagnosis not present

## 2023-07-29 DIAGNOSIS — M5412 Radiculopathy, cervical region: Secondary | ICD-10-CM

## 2023-07-29 DIAGNOSIS — M542 Cervicalgia: Secondary | ICD-10-CM

## 2023-07-29 DIAGNOSIS — M50123 Cervical disc disorder at C6-C7 level with radiculopathy: Secondary | ICD-10-CM | POA: Diagnosis not present

## 2023-07-29 DIAGNOSIS — M4722 Other spondylosis with radiculopathy, cervical region: Secondary | ICD-10-CM | POA: Diagnosis not present

## 2023-07-29 MED ORDER — GADOBUTROL 1 MMOL/ML IV SOLN
10.0000 mL | Freq: Once | INTRAVENOUS | Status: AC | PRN
  Administered 2023-07-29: 10 mL via INTRAVENOUS

## 2023-07-31 ENCOUNTER — Other Ambulatory Visit (HOSPITAL_COMMUNITY): Payer: Self-pay

## 2023-07-31 ENCOUNTER — Other Ambulatory Visit: Payer: Self-pay

## 2023-07-31 MED ORDER — SENNOSIDES-DOCUSATE SODIUM 8.6-50 MG PO TABS
1.0000 | ORAL_TABLET | Freq: Two times a day (BID) | ORAL | 0 refills | Status: DC | PRN
Start: 2023-07-31 — End: 2024-01-05
  Filled 2023-07-31 (×2): qty 60, 30d supply, fill #0

## 2023-07-31 MED ORDER — PREDNISONE 20 MG PO TABS
20.0000 mg | ORAL_TABLET | Freq: Every day | ORAL | 0 refills | Status: DC
  Filled 2023-07-31 (×2): qty 10, 10d supply, fill #0

## 2023-07-31 MED ORDER — TRAMADOL HCL 50 MG PO TABS
50.0000 mg | ORAL_TABLET | Freq: Two times a day (BID) | ORAL | 0 refills | Status: AC | PRN
Start: 2023-07-31 — End: ?
  Filled 2023-07-31 (×2): qty 60, 30d supply, fill #0

## 2023-08-05 ENCOUNTER — Other Ambulatory Visit: Payer: Self-pay

## 2023-08-05 ENCOUNTER — Other Ambulatory Visit (HOSPITAL_COMMUNITY): Payer: Self-pay

## 2023-08-05 MED ORDER — PREDNISONE 10 MG PO TABS
10.0000 mg | ORAL_TABLET | Freq: Every day | ORAL | 0 refills | Status: DC
Start: 2023-08-04 — End: 2024-01-05
  Filled 2023-08-05: qty 30, 30d supply, fill #0

## 2023-08-05 MED ORDER — MELOXICAM 15 MG PO TABS
15.0000 mg | ORAL_TABLET | Freq: Every day | ORAL | 0 refills | Status: DC | PRN
  Filled 2023-08-05: qty 30, 30d supply, fill #0

## 2023-08-29 ENCOUNTER — Other Ambulatory Visit (HOSPITAL_BASED_OUTPATIENT_CLINIC_OR_DEPARTMENT_OTHER): Payer: Self-pay

## 2023-08-29 ENCOUNTER — Other Ambulatory Visit (HOSPITAL_COMMUNITY): Payer: Self-pay

## 2023-09-02 DIAGNOSIS — M4802 Spinal stenosis, cervical region: Secondary | ICD-10-CM | POA: Diagnosis not present

## 2023-09-02 DIAGNOSIS — M542 Cervicalgia: Secondary | ICD-10-CM | POA: Diagnosis not present

## 2023-09-02 DIAGNOSIS — G5623 Lesion of ulnar nerve, bilateral upper limbs: Secondary | ICD-10-CM | POA: Diagnosis not present

## 2023-09-02 DIAGNOSIS — M5412 Radiculopathy, cervical region: Secondary | ICD-10-CM | POA: Diagnosis not present

## 2023-09-11 DIAGNOSIS — G8929 Other chronic pain: Secondary | ICD-10-CM | POA: Diagnosis not present

## 2023-09-11 DIAGNOSIS — M542 Cervicalgia: Secondary | ICD-10-CM | POA: Diagnosis not present

## 2023-09-11 DIAGNOSIS — Z7901 Long term (current) use of anticoagulants: Secondary | ICD-10-CM | POA: Diagnosis not present

## 2023-09-11 DIAGNOSIS — M7918 Myalgia, other site: Secondary | ICD-10-CM | POA: Diagnosis not present

## 2023-09-11 DIAGNOSIS — M47812 Spondylosis without myelopathy or radiculopathy, cervical region: Secondary | ICD-10-CM | POA: Diagnosis not present

## 2023-09-15 ENCOUNTER — Other Ambulatory Visit: Payer: Self-pay

## 2023-09-15 ENCOUNTER — Other Ambulatory Visit: Payer: Self-pay | Admitting: Cardiovascular Disease

## 2023-09-15 MED ORDER — SPIRONOLACTONE 25 MG PO TABS
25.0000 mg | ORAL_TABLET | Freq: Every day | ORAL | 0 refills | Status: DC
Start: 2023-09-15 — End: 2023-12-14
  Filled 2023-09-15: qty 90, 90d supply, fill #0

## 2023-09-25 ENCOUNTER — Other Ambulatory Visit (HOSPITAL_COMMUNITY): Payer: Self-pay

## 2023-09-25 ENCOUNTER — Other Ambulatory Visit: Payer: Self-pay

## 2023-09-30 DIAGNOSIS — M5412 Radiculopathy, cervical region: Secondary | ICD-10-CM | POA: Diagnosis not present

## 2023-10-02 DIAGNOSIS — G8929 Other chronic pain: Secondary | ICD-10-CM | POA: Diagnosis not present

## 2023-10-02 DIAGNOSIS — M7918 Myalgia, other site: Secondary | ICD-10-CM | POA: Diagnosis not present

## 2023-10-08 ENCOUNTER — Other Ambulatory Visit (HOSPITAL_COMMUNITY): Payer: Self-pay

## 2023-10-08 DIAGNOSIS — E559 Vitamin D deficiency, unspecified: Secondary | ICD-10-CM | POA: Diagnosis not present

## 2023-10-08 DIAGNOSIS — M4802 Spinal stenosis, cervical region: Secondary | ICD-10-CM | POA: Diagnosis not present

## 2023-10-08 DIAGNOSIS — M542 Cervicalgia: Secondary | ICD-10-CM | POA: Diagnosis not present

## 2023-10-08 DIAGNOSIS — Z9089 Acquired absence of other organs: Secondary | ICD-10-CM | POA: Diagnosis not present

## 2023-10-08 DIAGNOSIS — Z9889 Other specified postprocedural states: Secondary | ICD-10-CM | POA: Diagnosis not present

## 2023-10-08 DIAGNOSIS — I10 Essential (primary) hypertension: Secondary | ICD-10-CM | POA: Diagnosis not present

## 2023-10-08 DIAGNOSIS — M5412 Radiculopathy, cervical region: Secondary | ICD-10-CM | POA: Diagnosis not present

## 2023-10-19 ENCOUNTER — Other Ambulatory Visit (HOSPITAL_COMMUNITY): Payer: Self-pay

## 2023-10-20 ENCOUNTER — Other Ambulatory Visit (HOSPITAL_COMMUNITY): Payer: Self-pay

## 2023-10-26 ENCOUNTER — Other Ambulatory Visit (HOSPITAL_COMMUNITY): Payer: Self-pay

## 2023-10-29 ENCOUNTER — Other Ambulatory Visit: Payer: Self-pay

## 2023-10-30 ENCOUNTER — Other Ambulatory Visit (HOSPITAL_COMMUNITY): Payer: Self-pay

## 2023-10-30 DIAGNOSIS — G5601 Carpal tunnel syndrome, right upper limb: Secondary | ICD-10-CM | POA: Diagnosis not present

## 2023-10-30 DIAGNOSIS — G5622 Lesion of ulnar nerve, left upper limb: Secondary | ICD-10-CM | POA: Diagnosis not present

## 2023-10-30 MED ORDER — GABAPENTIN 300 MG PO CAPS
300.0000 mg | ORAL_CAPSULE | Freq: Every evening | ORAL | 3 refills | Status: AC
Start: 2023-10-30 — End: ?
  Filled 2023-10-30: qty 30, 30d supply, fill #0
  Filled 2023-11-24 – 2023-11-27 (×2): qty 30, 30d supply, fill #1
  Filled 2023-12-24: qty 30, 30d supply, fill #2
  Filled 2024-01-23: qty 30, 30d supply, fill #3

## 2023-11-24 ENCOUNTER — Other Ambulatory Visit (HOSPITAL_COMMUNITY): Payer: Self-pay

## 2023-11-26 ENCOUNTER — Other Ambulatory Visit: Payer: Self-pay

## 2023-11-29 ENCOUNTER — Other Ambulatory Visit (HOSPITAL_COMMUNITY): Payer: Self-pay

## 2023-12-04 ENCOUNTER — Encounter (INDEPENDENT_AMBULATORY_CARE_PROVIDER_SITE_OTHER): Payer: Self-pay | Admitting: Internal Medicine

## 2023-12-04 ENCOUNTER — Other Ambulatory Visit (HOSPITAL_COMMUNITY): Payer: Self-pay

## 2023-12-04 DIAGNOSIS — I421 Obstructive hypertrophic cardiomyopathy: Secondary | ICD-10-CM

## 2023-12-04 DIAGNOSIS — I1A0 Resistant hypertension: Secondary | ICD-10-CM

## 2023-12-04 MED ORDER — HYDROCHLOROTHIAZIDE 12.5 MG PO CAPS
12.5000 mg | ORAL_CAPSULE | Freq: Every day | ORAL | 3 refills | Status: AC
  Filled 2023-12-04: qty 90, 90d supply, fill #0
  Filled 2024-03-01: qty 90, 90d supply, fill #1

## 2023-12-04 NOTE — Addendum Note (Signed)
 Addended by: RANDY HAMP SAILOR on: 12/04/2023 03:42 PM   Modules accepted: Orders

## 2023-12-04 NOTE — Telephone Encounter (Signed)
 Mr. 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    Most recent blood pressure readings from summer 2025 (June-September) show an overall average of 156/87 mmHg with a pulse of 61 bpm and MAP (mean arterial pressure) of about 110 mmHg. Morning averages were lower (147/82 mmHg), while evening averages were higher (160/90 mmHg).  Given history of bradycardia and fatigue (that did not improve with AV nodal down titration) would favor increase in total hydrochlorothiazide  dosing to 25 mg (adding 12.5 mg to his combination pill).  BMP at his office in two weeks.  If no improvement increase metoprolol  dose and f/u with me or Tessa.  Please see the MyChart message reply(ies) for my assessment and plan.    This patient gave consent for this Medical Advice Message and is aware that it may result in a bill to [employer], as well as the possibility of receiving a bill for a co-payment or deductible. They are an established patient, but are not seeking medical advice exclusively about a problem treated during an in person or video visit in the last seven days. I did not recommend an in person or video visit within seven days of my reply.    I spent a total of 10 minutes cumulative time within 7 days through [employer].  Stanly DELENA Leavens, MD

## 2023-12-14 ENCOUNTER — Other Ambulatory Visit: Payer: Self-pay | Admitting: Cardiovascular Disease

## 2023-12-15 ENCOUNTER — Other Ambulatory Visit (HOSPITAL_COMMUNITY): Payer: Self-pay

## 2023-12-15 ENCOUNTER — Other Ambulatory Visit: Payer: Self-pay

## 2023-12-15 DIAGNOSIS — G5601 Carpal tunnel syndrome, right upper limb: Secondary | ICD-10-CM | POA: Diagnosis not present

## 2023-12-15 MED ORDER — SPIRONOLACTONE 25 MG PO TABS
25.0000 mg | ORAL_TABLET | Freq: Every day | ORAL | 0 refills | Status: DC
  Filled 2023-12-15: qty 30, 30d supply, fill #0

## 2023-12-24 ENCOUNTER — Other Ambulatory Visit (HOSPITAL_COMMUNITY): Payer: Self-pay

## 2023-12-24 DIAGNOSIS — G4733 Obstructive sleep apnea (adult) (pediatric): Secondary | ICD-10-CM | POA: Diagnosis not present

## 2023-12-28 ENCOUNTER — Other Ambulatory Visit: Payer: Self-pay

## 2023-12-31 ENCOUNTER — Ambulatory Visit (HOSPITAL_COMMUNITY)
Admission: RE | Admit: 2023-12-31 | Discharge: 2023-12-31 | Disposition: A | Discharge status: Home / Self Care | Source: Ambulatory Visit | Attending: Cardiology | Admitting: Cardiology

## 2023-12-31 DIAGNOSIS — I421 Obstructive hypertrophic cardiomyopathy: Secondary | ICD-10-CM

## 2023-12-31 LAB — ECHOCARDIOGRAM LIMITED
Area-P 1/2: 3.42 cm2
S' Lateral: 2.5 cm

## 2024-01-01 ENCOUNTER — Encounter: Payer: Self-pay | Admitting: Internal Medicine

## 2024-01-02 DIAGNOSIS — Z23 Encounter for immunization: Secondary | ICD-10-CM | POA: Diagnosis not present

## 2024-01-04 ENCOUNTER — Ambulatory Visit: Payer: Self-pay | Admitting: Internal Medicine

## 2024-01-04 DIAGNOSIS — I421 Obstructive hypertrophic cardiomyopathy: Secondary | ICD-10-CM

## 2024-01-04 NOTE — Progress Notes (Unsigned)
 Cardiology Office Note   Date:  01/05/2024  ID:  Alan, Spencer 1955/12/30, MRN [id number] PCP: Yolande Toribio MATSU, MD  [facility] Providers Cardiologist:  Dorn Lesches, MD   History of Present Illness Alan Spencer is a 68 y.o. male with a past medical history of HCM and uncontrolled HTN here for  follow-up appointment.  Patient was initially evaluated 07/30/2021. At that time medications that would affect his LVOT gradient were down titrated.  No changes on the MRAs.  Unable to cough diuretics for blood pressure.  Was not a candidate for the radiance trial (due to diastolic blood pressure).  In 2024 he was started on mavacamten  with NYHA II symptoms; found to have ASD with RV dilation without dysfunction.  Had AF post ASD closure.  AF improved.  He was weaned off his beta-blocker and his fatigue improved.  [name] has a history of secundum atrial septal defect (ASD) closure, right ventricular dilation without dysfunction, and obstructive hypertrophic cardiomyopathy.  He reported improvement in his NYHA class 3-2.  Recently, patient's heart rate and outflow tract gradient increased prompting adjustment to his medication regimen.  However, he started the increased dose of mavacamten .  Patient also reports occasional numbness in his arms particularly in the mornings.  This sensation is suspected to be related to neck issues as confirmed by recent x-ray.  The patient has been managing this discomfort alongside his cardiac symptoms.  Patient had been on Eliquis  for anticoagulation following an ASD closure and a brief episode of atrial fibrillation.  He expresses a concern about the bleeding risk associated with his medication citing a recent minor bleeding event.  However, he acknowledges the potential stroke risk associated with his hypertrophic cardiomyopathy and previous atrial fibrillation.  Patient is also grappling with financial implications on his medication regimen  particularly the cost of mavacamten .  He expresses concerns about the affordability of this medication which is crucial for managing his obstructive hypertrophic cardiomyopathy.  Patient is hopeful for potential changes in medication coverage in the upcoming year.  Today, he presents with a hx of hypertrophic cardiomyopathy who presents for medication management and follow-up.  He is on mavacamten  10 mg for hypertrophic cardiomyopathy and experiences no significant symptoms such as increased dyspnea or decreased exercise tolerance. His recent echocardiogram showed a gradient of 47 mmHg with Valsalva, compared to 21 mmHg previously, but he feels well overall with no significant changes in stamina or dyspnea. No palpitations, skipped beats, or fluttering in his chest.   He takes hydrochlorothiazide  for fluid management and experiences minimal pedal edema, primarily at the sock line. He regularly monitors his blood pressure, noting fluctuations with higher readings in the afternoon, sometimes reaching the 160s. He takes hydralazine  if his blood pressure exceeds 160. During a recent echocardiogram, his diastolic blood pressure was 60, but he does not experience dizziness or lightheadedness. His heart rate has been stable, with a recent reading in the 50s, although it has been as low as 41 in the past.  Reports no shortness of breath nor dyspnea on exertion. Reports no chest pain, pressure, or tightness. No edema, orthopnea, PND. Reports no palpitations.   Discussed the use of AI scribe software for clinical note transcription with the patient, who gave verbal consent to proceed.  ROS: pertinent ROS in HPI  Studies Reviewed EKG Interpretation Date/Time:  Tuesday January 05 2024 10:44:56 EDT Ventricular Rate:  50 PR Interval:  222 QRS Duration:  112 QT Interval:  414 QTC  Calculation: 377 R Axis:   -41  Text Interpretation: Sinus bradycardia with sinus arrhythmia with 1st degree A-V block Left axis  deviation Moderate voltage criteria for LVH, may be normal variant ( R in aVL , Cornell product ) Nonspecific T wave abnormality When compared with ECG of 08-Oct-2022 11:44, No significant change was found Confirmed by Lucien Blanc [phone]) on 01/05/2024 1:22:25 PM    Echocardiogram 12/31/2023  IMPRESSIONS     1. 10mg  Camzyos . Peak gradient of with Valsalva, minimal at rest.  Left ventricular ejection fraction, by estimation, is 60 to 65%. Left  ventricular ejection fraction by 3D volume is 60 %. The left ventricle has  normal function. There is mild  asymmetric left ventricular hypertrophy of the basal-septal segment. Left  ventricular diastolic parameters are consistent with Grade I diastolic  dysfunction (impaired relaxation). The average left ventricular global  longitudinal strain is -19.7 %. The  global longitudinal strain is normal.   2. Aortic valve sclerosis/calcification is present, without any evidence  of aortic stenosis.   3. There is borderline dilatation of the ascending aorta, measuring 40  mm.    CARDIAC CATHETERIZATION   CARDIAC CATHETERIZATION 07/24/2022   Narrative Successful transcatheter closure of a secundum atrial septal defect using an 18 mm Amplatzer septal occluder device under fluoroscopic and intracardiac echo guidance   Recommendations: DAPT with aspirin  and clopidogrel  x 6 months as tolerated SBE prophylaxis when indicated x 6 months Limited 2D echocardiogram prior to hospital discharge, same-day discharge protocol if all criteria met     ECHOCARDIOGRAM   ECHOCARDIOGRAM LIMITED 01/30/2023   Narrative ECHOCARDIOGRAM LIMITED REPORT       Patient Name:   Alan Spencer Date of Exam: 01/30/2023 Medical Rec #:  [id number]     Height:       68.0 in Accession #:    [id number]    Weight:       207.0 lb Date of Birth:  [birth year]    BSA:          2.074 m Patient Age:    67 years      BP:           160/85 mmHg Patient Gender: M             HR:            71 bpm. Exam Location:  [address]   Procedure: Limited Echo, Cardiac Doppler, Limited Color Doppler, 3D Echo and Strain Analysis   Indications:    I42.2 HOCM   History:        Patient has prior history of Echocardiogram examinations, most recent 11/07/2022. Risk Factors:Hypertension, Dyslipidemia and Sleep Apnea. Obesity. ASD. Elevated coronary calcium  score. Hyperglycemia.   Sonographer:    Jon Hacker RCS Referring Phys: [id number] [facility] A CHANDRASEKHAR   IMPRESSIONS     1. There is mild asymmetric left ventricular hypertrophy of the basal-septal segment. Left ventricular diastolic parameters are consistent with Grade I diastolic dysfunction (impaired relaxation). The average left ventricular global longitudinal strain is -19.0 %. The global longitudinal strain is normal. LV outflow tract gradient 18 mmHg at rest but increases to 68 mmHg with Valsalva. No mitral valve systolic anterior motion noted. 2. Right ventricular systolic function is normal. The right ventricular size is normal. There is normal pulmonary artery systolic pressure. The estimated right ventricular systolic pressure is 22.0 mmHg. 3. Left atrial size was mildly dilated. 4. There is an Amplatzer atrial septal closure device present, no residual  shunt visualized. 5. The mitral valve is normal in structure. Trivial mitral valve regurgitation. No evidence of mitral stenosis. 6. The aortic valve is tricuspid. There is moderate calcification of the aortic valve. Aortic valve regurgitation is not visualized. No aortic stenosis is present. 7. The inferior vena cava is normal in size with greater than 50% respiratory variability, suggesting right atrial pressure of 3 mmHg.   FINDINGS Left Ventricle: Left ventricular ejection fraction, by estimation, is 65 to 70%. The left ventricle has normal function. The left ventricle has no regional wall motion abnormalities. The average left ventricular global longitudinal  strain is -19.0 %. The global longitudinal strain is normal. There is mild asymmetric left ventricular hypertrophy of the basal-septal segment. Left ventricular diastolic parameters are consistent with Grade I diastolic dysfunction (impaired relaxation).   Right Ventricle: The right ventricular size is normal. No increase in right ventricular wall thickness. Right ventricular systolic function is normal. There is normal pulmonary artery systolic pressure. The tricuspid regurgitant velocity is 2.18 m/s, and with an assumed right atrial pressure of 3 mmHg, the estimated right ventricular systolic pressure is 22.0 mmHg.   Left Atrium: Left atrial size was mildly dilated.   Right Atrium: Right atrial size was normal in size.   Pericardium: There is no evidence of pericardial effusion.   Mitral Valve: The mitral valve is normal in structure. Trivial mitral valve regurgitation. No evidence of mitral valve stenosis.   Aortic Valve: The aortic valve is tricuspid. There is moderate calcification of the aortic valve. Aortic valve regurgitation is not visualized. No aortic stenosis is present.   Pulmonic Valve: The pulmonic valve was normal in structure. Pulmonic valve regurgitation is trivial.   Aorta: The aortic root is normal in size and structure.   Venous: The inferior vena cava is normal in size with greater than 50% respiratory variability, suggesting right atrial pressure of 3 mmHg.   IAS/Shunts: There is an Amplatzer atrial septal closure device present, no residual shunt visualized.   LEFT VENTRICLE PLAX 2D LVIDd:         3.80 cm   Diastology LVIDs:         1.70 cm   LV e' medial:    6.53 cm/s LV PW:         1.10 cm   LV E/e' medial:  12.4 LV IVS:        1.10 cm   LV e' lateral:   6.64 cm/s LVOT diam:     2.20 cm   LV E/e' lateral: 12.2 LV SV:         107 LV SV Index:   52        2D Longitudinal Strain LVOT Area:     3.80 cm  2D Strain GLS Avg:     -19.0 %   3D Volume EF: 3D EF:         65 % LV EDV:       96 ml LV ESV:       34 ml LV SV:        62 ml   RIGHT VENTRICLE RV S prime:     12.10 cm/s RVSP:           27.0 mmHg   LEFT ATRIUM             Index        RIGHT ATRIUM           Index LA diam:  4.50 cm 2.17 cm/m   RA Pressure: 8.00 mmHg LA Vol (A2C):   65.8 ml 31.73 ml/m  RA Area:     9.54 cm LA Vol (A4C):   66.3 ml 31.97 ml/m  RA Volume:   13.50 ml  6.51 ml/m LA Biplane Vol: 65.8 ml 31.73 ml/m AORTIC VALVE LVOT Vmax:   134.00 cm/s LVOT Vmean:  93.700 cm/s LVOT VTI:    0.282 m   AORTA Ao Root diam: 3.40 cm Ao Asc diam:  3.70 cm   MITRAL VALVE               TRICUSPID VALVE MV Area (PHT): 3.97 cm    TR Peak grad:   19.0 mmHg MV Decel Time: 191 msec    TR Vmax:        218.00 cm/s MV E velocity: 80.70 cm/s  Estimated RAP:  8.00 mmHg MV A velocity: 99.00 cm/s  RVSP:           27.0 mmHg MV E/A ratio:  0.82 SHUNTS Systemic VTI:  0.28 m Systemic Diam: 2.20 cm   Dalton McleanMD Electronically signed by Ezra Kanner Signature Date/Time: 01/30/2023/12:55:17 PM       Final   TEE   ECHO TEE 06/13/2022   Narrative TRANSESOPHOGEAL ECHO REPORT       Patient Name:   MARGARITA BOBROWSKI Date of Exam: 06/13/2022 Medical Rec #:  [id number]     Height:       68.0 in Accession #:    [id number]    Weight:       206.0 lb Date of Birth:  04/19/1955    BSA:          2.070 m Patient Age:    66 years      BP:           100/50 mmHg Patient Gender: M             HR:           55 bpm. Exam Location:  Inpatient   Procedure: 3D Echo, Transesophageal Echo, Cardiac Doppler and Color Doppler   Indications:     Q21.1 ASD   History:         Patient has prior history of Echocardiogram examinations, most recent 05/26/2022. Signs/Symptoms:Dyspnea and Shortness of Breath; Risk Factors:Sleep Apnea and Dyslipidemia. ASD. HOCM. Chordal SAM.   Sonographer:     Ellouise Mose RDCS Referring Phys:  [id number] [facility] A SANTO Diagnosing Phys: Stanly SANTO  MD   PROCEDURE: After discussion of the risks and benefits of a TEE, an informed consent was obtained from the patient. The transesophogeal probe was passed without difficulty through the esophogus of the patient. Imaged were obtained with the patient in a left lateral decubitus position. Sedation performed by different physician. The patient was monitored while under deep sedation. Anesthestetic sedation was provided intravenously by Anesthesiology: 155mg  of Propofol . The patient's vital signs; including heart rate, blood pressure, and oxygen saturation; remained stable throughout the procedure. The patient developed no complications during the procedure. Transgastric views not obtained.   IMPRESSIONS     1. Evidence of atrial level shunting detected by color flow Doppler. There is a moderately sized secundum atrial septal defect with predominantly left to right shunting across the atrial septum. Largest dimensions 2.39 cm by 2.22 cm. Smallest rims are anterior (5 mm) and superior (6 mm). Sufficient dimensions for an Amplatzer ASD- 024 device. 2. Chordal SAM with no significant LVOT gradient.. Left ventricular  ejection fraction, by estimation, is 65 to 70%. The left ventricle has normal function. There is severe asymmetric left ventricular hypertrophy of the septal segment. 3. Right ventricular systolic function is normal. The right ventricular size is moderately enlarged. 4. No left atrial/left atrial appendage thrombus was detected. 5. The mitral valve is normal in structure. Mild mitral valve regurgitation. No evidence of mitral stenosis. 6. The aortic valve is tricuspid. Aortic valve regurgitation is not visualized. No aortic stenosis is present.   FINDINGS Left Ventricle: Chordal SAM with no significant LVOT gradient. Left ventricular ejection fraction, by estimation, is 65 to 70%. The left ventricle has normal function. The left ventricular internal cavity size was normal in size. There is  severe asymmetric left ventricular hypertrophy of the septal segment.   Right Ventricle: The right ventricular size is moderately enlarged. Right vetricular wall thickness was not well visualized. Right ventricular systolic function is normal.   Left Atrium: Left atrial size was normal in size. No left atrial/left atrial appendage thrombus was detected.   Right Atrium: Right atrial size was normal in size.   Pericardium: There is no evidence of pericardial effusion.   Mitral Valve: The mitral valve is normal in structure. Mild mitral valve regurgitation. No evidence of mitral valve stenosis.   Tricuspid Valve: The tricuspid valve is normal in structure. Tricuspid valve regurgitation is not demonstrated. No evidence of tricuspid stenosis.   Aortic Valve: The aortic valve is tricuspid. Aortic valve regurgitation is not visualized. No aortic stenosis is present.   Pulmonic Valve: The pulmonic valve was not well visualized. Pulmonic valve regurgitation is not visualized. No evidence of pulmonic stenosis.   Aorta: The aortic root and ascending aorta are structurally normal, with no evidence of dilitation.   IAS/Shunts: Evidence of atrial level shunting detected by color flow Doppler. There is a moderately sized secundum atrial septal defect with predominantly left to right shunting across the atrial septum.   Additional Comments: Spectral Doppler performed.   Stanly Leavens MD Electronically signed by Stanly Leavens MD Signature Date/Time: 06/13/2022/4:51:57 PM       Final   MONITORS   LONG TERM MONITOR (3-14 DAYS) 08/25/2022   Narrative   Patient had a minimum heart rate of 28 bpm, maximum heart rate of 108 bpm, and average heart rate of 51 bpm.   Predominant underlying rhythm was sinus rhythm.   Five sinus pauses noted; one of which was during the day, longest lasting 3.8 seconds, no device triggering.   Asymptomatic bradycardia without atrial fibrillation.   CT  SCANS   CT CORONARY MORPH W/CTA COR W/SCORE 04/26/2021   Addendum 04/26/2021 12:53 PM ADDENDUM REPORT: 04/26/2021 12:51   CLINICAL DATA:  Dyspnea with coronary risk factors   EXAM: Cardiac CTA   MEDICATIONS: Sub lingual nitro. 4 mg and lopressor  100mg    TECHNIQUE: The patient was scanned on a [employer] 192 scanner. Gantry rotation speed was 250 msecs. Collimation was. 6 mm . A 120 kV prospective scan was triggered in the ascending thoracic aorta at 140 HU's with full mA between 30-70% of the R-R interval . Average HR during the scan was 45 bpm. The 3D data set was interpreted on a dedicated work station using MPR, MIP and VRT modes. A total of 80 cc of contrast was used.   FINDINGS: Non-cardiac: See separate report from [facility]. No significant findings on limited lung and soft tissue windows.   Calcium  score: Mild 2 vessel calcium  noted   LM 0  LAD 0.305   LCX 0   RCA 39.5   Total: 39.8 which is 41 st percentile for age/sex   Coronary Arteries: Right dominant with no anomalies   LM: Normal   LAD: 1-24% calcified plaque in mid vessel   D1: Normal   D2: Normal   D3 Normal   Circumflex: Normal   OM1: Normal   OM2: Normal   OM3: Normal   RCA: 1-24% calcified plaque in proximal, mid and distal vessel   PDA: Normal   PLA: Normal   IMPRESSION: 1. Calcium  score 39.8 which is 41 st percentile for age/sex   2.  Normal ascending thoracic aorta 3.6 cm   3.  CAD RADS 1 non obstructive CAD see description above   Maude Emmer     Electronically Signed By: Maude Emmer M.D. On: 04/26/2021 12:51   Narrative EXAM: OVER-READ INTERPRETATION  CT CHEST   The following report is an over-read performed by radiologist Dr. Toribio Aye of [facility] on 04/26/2021. This over-read does not include interpretation of cardiac or coronary anatomy or pathology. The coronary calcium  score/coronary CTA interpretation by the  cardiologist is attached.   COMPARISON:  No priors.   FINDINGS: Atherosclerotic calcifications in the thoracic aorta. Within the visualized portions of the thorax there are no suspicious appearing pulmonary nodules or masses, there is no acute consolidative airspace disease, no pleural effusions, no pneumothorax and no lymphadenopathy. Visualized portions of the upper abdomen are unremarkable. There are no aggressive appearing lytic or blastic lesions noted in the visualized portions of the skeleton.   IMPRESSION: 1.  Aortic Atherosclerosis (ICD10-I70.0).   Electronically Signed: By: Toribio Aye M.D. On: 04/26/2021 12:04   CARDIAC MRI   MR CARDIAC MORPHOLOGY W WO CONTRAST 09/04/2021   Narrative CLINICAL DATA:  Clinical question of hypertrophic cardiomyopathy Study assumes HCT of 45 and BSA of 2.12 m2.   EXAM: CARDIAC MRI   TECHNIQUE: The patient was scanned on a 1.5 Tesla GE magnet. A dedicated cardiac coil was used. Functional imaging was done using [employer] sequences. 2,3, and 4 chamber views were done to assess for RWMA's. Modified Simpson's rule using a short axis stack was used to calculate an ejection fraction on a dedicated work [employer]. The patient received 10 cc of Gadavist . After 10 minutes inversion recovery sequences were used to assess for infiltration and scar tissue.   CONTRAST:  10 cc  of Gadavist    FINDINGS: 1. Normal left ventricular size, with LVEDD 46 mm, and LVEDVi 46 mL/m2.   Normal left ventricular thickness, with intraventricular septal thickness of 15 mm, posterior wall thickness of 10 mm, though myocardial mass index normal for gender at 65 g/m2.   Normal left ventricular systolic function (LVEF =63%). There are no regional wall motion abnormalities. There is late peaking chordal systolic motion of the anterior mitral valve (chordal SAM).   Left ventricular parametric mapping notable for normal ECV signal and  normal T2 signal.   There is no late gadolinium enhancement in the left ventricular myocardium. Six standard deviation reference used.   2. Normal right ventricular size with RVEDVI 87 mL/m2.   Normal right ventricular thickness.   Normal right ventricular systolic function (RVEF =52%). There are no regional wall motion abnormalities or aneurysms.   3.  Normal left and right atrial size.   4. Normal size of the aortic root, ascending aorta and pulmonary artery.   5. Valve assessment:   Aortic Valve: Tri-leaflet aortic  valve. Regurgitant fraction 14%, mean gradient of 1 mm Hg. Mild central aortic regurgitation.   Pulmonic Valve: Regurgitant fraction 2%, mean gradient < 1 mm Hg. Qualitatively, there is no significant regurgitation.   Tricuspid Valve: Regurgitant fraction 10%. Mild central tricuspid regurgitation   Mitral Valve: Regurgitant fraction 16%.  Mild mitral regurgitation.   6.  Normal pericardium.  No pericardial effusion.   7. Grossly, no extracardiac findings. Recommended dedicated study if concerned for non-cardiac pathology.   IMPRESSION: In absence of system disease leading to hypertrophy, no evidence of infiltrative disease and consistent with hypertrophic cardiomyopathy.   There is qualitatively turbulent LVOT flow often seen in hypertrophic cardiomyopathy.   No evidence of scar.   Stanly Leavens MD     Electronically Signed By: Stanly Leavens M.D. On: 09/05/2021 07:37             Physical Exam VS:  BP 116/60   Pulse (!) 52   Ht 5' 8 (1.727 m)   Wt 213 lb (96.6 kg)   SpO2 96%   BMI 32.39 kg/m        Wt Readings from Last 3 Encounters:  01/05/24 213 lb (96.6 kg)  02/06/23 213 lb (96.6 kg)  11/25/22 207 lb (93.9 kg)    GEN: Well nourished, well developed in no acute distress NECK: No JVD; No carotid bruits CARDIAC: RRR, no murmurs, rubs, gallops RESPIRATORY:  Clear to auscultation without rales, wheezing or rhonchi   ABDOMEN: Soft, non-tender, non-distended EXTREMITIES:  No edema; No deformity   ASSESSMENT AND PLAN   Obstructive hypertrophic cardiomyopathy - Patient is on mavacamten  with improvement of NYHA class 3-2. Asymptomatic today.   Surgical intervention at [facility] clinic considered if medication becomes unaffordable or if patient prefers discontinuation. Valor trial was discussed at his last office visit -patient is on 10 mg daily of mavacamten .  Plan was for echocardiogram in 6 months with follow-up shortly after.   - There was an increase in his LVOT gradient from 25 mmHg with Valsalva to 47 mmHg with Valsalva.  Minimal gradient at rest on both echocardiograms.  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 Blood pressure shows variability, occasionally reaching 160 mmHg. Current regimen effective with no significant side effects. - Continue hydrochlorothiazide  as prescribed. - Continue hydralazine  for blood pressure above 160 mmHg. - Advise to monitor blood pressure after resting for 10 minutes. - Instruct to report consistently high blood pressure or symptoms such as dizziness or lightheadedness.  Paroxysmal atrial fibrillation/ASD Secundum ASD closeure, experienced Afib 6 months postop but no symptoms recently. -Patient prefers to continue Eliquis . Monitors Afib on his apple watch -No interest in ILR  HLD -recommend annual lipid panel, can do with us  or PCP     Dispo: He can follow-up with Dr. Leavens in 6 months with an echo  Signed, Orren LOISE Fabry, PA-C

## 2024-01-05 ENCOUNTER — Ambulatory Visit: Attending: Physician Assistant | Admitting: Physician Assistant

## 2024-01-05 VITALS — BP 116/60 | HR 52 | Ht 68.0 in | Wt 213.0 lb

## 2024-01-05 DIAGNOSIS — I10 Essential (primary) hypertension: Secondary | ICD-10-CM | POA: Diagnosis not present

## 2024-01-05 DIAGNOSIS — I421 Obstructive hypertrophic cardiomyopathy: Secondary | ICD-10-CM | POA: Diagnosis not present

## 2024-01-05 DIAGNOSIS — I1A0 Resistant hypertension: Secondary | ICD-10-CM

## 2024-01-05 DIAGNOSIS — E782 Mixed hyperlipidemia: Secondary | ICD-10-CM

## 2024-01-05 DIAGNOSIS — Q211 Atrial septal defect, unspecified: Secondary | ICD-10-CM

## 2024-01-05 NOTE — Patient Instructions (Addendum)
 Medication Instructions:  Your physician recommends that you continue on your current medications as directed. Please refer to the Current Medication list given to you today. *If you need a refill on your cardiac medications before your next appointment, please call your pharmacy*  Lab Work: None ordered If you have labs (blood work) drawn today and your tests are completely normal, you will receive your results only by: MyChart Message (if you have MyChart) OR A paper copy in the mail If you have any lab test that is abnormal or we need to change your treatment, we will call you to review the results.  Testing/Procedures: PLEASE SCHEDULE FOR 6 MONTHS OUT (APRIL 2026) Your physician has requested that you have an echocardiogram. Echocardiography is a painless test that uses sound waves to create images of your heart. It provides your doctor with information about the size and shape of your heart and how well your heart's chambers and valves are working. This procedure takes approximately one hour. There are no restrictions for this procedure. Please do NOT wear cologne, perfume, aftershave, or lotions (deodorant is allowed). Please arrive 15 minutes prior to your appointment time.  Please note: We ask at that you not bring children with you during ultrasound (echo/ vascular) testing. Due to room size and safety concerns, children are not allowed in the ultrasound rooms during exams. Our front office staff cannot provide observation of children in our lobby area while testing is being conducted. An adult accompanying a patient to their appointment will only be allowed in the ultrasound room at the discretion of the ultrasound technician under special circumstances. We apologize for any inconvenience.    Follow-Up: At [facility], you and your health needs are our priority.  As part of our continuing mission to provide you with exceptional heart care, our providers are all part of one  team.  This team includes your primary Cardiologist (physician) and Advanced Practice Providers or APPs (Physician Assistants and Nurse Practitioners) who all work together to provide you with the care you need, when you need it.  Your next appointment:   6 month(s)  Provider:   Stanly Leavens, MD  We recommend signing up for the patient portal called MyChart.  Sign up information is provided on this After Visit Summary.  MyChart is used to connect with patients for Virtual Visits (Telemedicine).  Patients are able to view lab/test results, encounter notes, upcoming appointments, etc.  Non-urgent messages can be sent to your provider as well.   To learn more about what you can do with MyChart, go to [email or web address].   Other Instructions

## 2024-01-05 NOTE — Addendum Note (Signed)
 Addended by: RANDY HAMP SAILOR on: 01/05/2024 02:33 PM   Modules accepted: Orders

## 2024-01-07 ENCOUNTER — Other Ambulatory Visit: Payer: Self-pay | Admitting: Cardiovascular Disease

## 2024-01-11 ENCOUNTER — Other Ambulatory Visit: Payer: Self-pay

## 2024-01-11 ENCOUNTER — Other Ambulatory Visit (HOSPITAL_COMMUNITY): Payer: Self-pay

## 2024-01-11 MED ORDER — SPIRONOLACTONE 25 MG PO TABS
25.0000 mg | ORAL_TABLET | Freq: Every day | ORAL | 3 refills | Status: AC
  Filled 2024-01-11: qty 90, 90d supply, fill #0
  Filled 2024-04-08: qty 90, 90d supply, fill #1

## 2024-01-12 ENCOUNTER — Other Ambulatory Visit: Payer: Self-pay

## 2024-01-12 ENCOUNTER — Other Ambulatory Visit (HOSPITAL_COMMUNITY): Payer: Self-pay

## 2024-01-12 DIAGNOSIS — Z8679 Personal history of other diseases of the circulatory system: Secondary | ICD-10-CM | POA: Diagnosis not present

## 2024-01-12 DIAGNOSIS — I422 Other hypertrophic cardiomyopathy: Secondary | ICD-10-CM | POA: Diagnosis not present

## 2024-01-12 DIAGNOSIS — K625 Hemorrhage of anus and rectum: Secondary | ICD-10-CM | POA: Diagnosis not present

## 2024-01-12 DIAGNOSIS — K5900 Constipation, unspecified: Secondary | ICD-10-CM | POA: Diagnosis not present

## 2024-01-12 MED ORDER — HYDROCORTISONE 2.5 % EX CREA
1.0000 | TOPICAL_CREAM | Freq: Every day | CUTANEOUS | 1 refills | Status: AC
  Filled 2024-01-12: qty 30, 20d supply, fill #0
  Filled 2024-01-28: qty 30, 20d supply, fill #1

## 2024-01-12 MED ORDER — ZEPBOUND 2.5 MG/0.5ML ~~LOC~~ SOAJ
2.5000 mg | SUBCUTANEOUS | 0 refills | Status: DC
  Filled 2024-01-12 – 2024-01-15 (×3): qty 2, 28d supply, fill #0

## 2024-01-14 ENCOUNTER — Other Ambulatory Visit: Payer: Self-pay

## 2024-01-14 ENCOUNTER — Other Ambulatory Visit (HOSPITAL_COMMUNITY): Payer: Self-pay

## 2024-01-15 ENCOUNTER — Other Ambulatory Visit: Payer: Self-pay

## 2024-01-15 ENCOUNTER — Other Ambulatory Visit (HOSPITAL_COMMUNITY): Payer: Self-pay

## 2024-01-18 ENCOUNTER — Other Ambulatory Visit (HOSPITAL_COMMUNITY): Payer: Self-pay

## 2024-01-20 ENCOUNTER — Other Ambulatory Visit (HOSPITAL_COMMUNITY): Payer: Self-pay

## 2024-01-23 ENCOUNTER — Other Ambulatory Visit (HOSPITAL_COMMUNITY): Payer: Self-pay

## 2024-01-27 ENCOUNTER — Other Ambulatory Visit: Payer: Self-pay

## 2024-01-28 ENCOUNTER — Other Ambulatory Visit (HOSPITAL_BASED_OUTPATIENT_CLINIC_OR_DEPARTMENT_OTHER): Payer: Self-pay

## 2024-01-28 DIAGNOSIS — G4733 Obstructive sleep apnea (adult) (pediatric): Secondary | ICD-10-CM | POA: Diagnosis not present

## 2024-01-29 DIAGNOSIS — G4733 Obstructive sleep apnea (adult) (pediatric): Secondary | ICD-10-CM | POA: Diagnosis not present

## 2024-02-17 ENCOUNTER — Other Ambulatory Visit (HOSPITAL_COMMUNITY): Payer: Self-pay

## 2024-02-22 ENCOUNTER — Other Ambulatory Visit (HOSPITAL_COMMUNITY): Payer: Self-pay

## 2024-02-22 ENCOUNTER — Other Ambulatory Visit: Payer: Self-pay

## 2024-02-23 ENCOUNTER — Other Ambulatory Visit (HOSPITAL_COMMUNITY): Payer: Self-pay

## 2024-02-23 MED ORDER — GABAPENTIN 300 MG PO CAPS
300.0000 mg | ORAL_CAPSULE | Freq: Every day | ORAL | 1 refills | Status: AC
  Filled 2024-02-23: qty 100, 100d supply, fill #1
  Filled 2024-02-23: qty 100, 100d supply, fill #0

## 2024-02-26 ENCOUNTER — Other Ambulatory Visit (HOSPITAL_COMMUNITY): Payer: Self-pay

## 2024-03-01 ENCOUNTER — Other Ambulatory Visit (HOSPITAL_COMMUNITY): Payer: Self-pay

## 2024-03-07 ENCOUNTER — Other Ambulatory Visit (HOSPITAL_COMMUNITY): Payer: Self-pay

## 2024-03-09 ENCOUNTER — Other Ambulatory Visit (HOSPITAL_COMMUNITY): Payer: Self-pay

## 2024-03-11 ENCOUNTER — Other Ambulatory Visit: Payer: Self-pay | Admitting: Internal Medicine

## 2024-03-11 NOTE — Telephone Encounter (Signed)
 Pt of Dr. Santo. Please advise on Labs and Refills.

## 2024-04-04 ENCOUNTER — Other Ambulatory Visit: Payer: Self-pay

## 2024-04-04 ENCOUNTER — Emergency Department (HOSPITAL_BASED_OUTPATIENT_CLINIC_OR_DEPARTMENT_OTHER)

## 2024-04-04 ENCOUNTER — Observation Stay (HOSPITAL_BASED_OUTPATIENT_CLINIC_OR_DEPARTMENT_OTHER)
Admission: EM | Admit: 2024-04-04 | Discharge: 2024-04-06 | Disposition: A | Attending: Emergency Medicine | Admitting: Emergency Medicine

## 2024-04-04 ENCOUNTER — Encounter (HOSPITAL_BASED_OUTPATIENT_CLINIC_OR_DEPARTMENT_OTHER): Payer: Self-pay

## 2024-04-04 DIAGNOSIS — N200 Calculus of kidney: Secondary | ICD-10-CM | POA: Diagnosis not present

## 2024-04-04 DIAGNOSIS — E782 Mixed hyperlipidemia: Secondary | ICD-10-CM | POA: Insufficient documentation

## 2024-04-04 DIAGNOSIS — D62 Acute posthemorrhagic anemia: Secondary | ICD-10-CM | POA: Insufficient documentation

## 2024-04-04 DIAGNOSIS — K922 Gastrointestinal hemorrhage, unspecified: Principal | ICD-10-CM

## 2024-04-04 DIAGNOSIS — G4733 Obstructive sleep apnea (adult) (pediatric): Secondary | ICD-10-CM | POA: Diagnosis not present

## 2024-04-04 DIAGNOSIS — Z79899 Other long term (current) drug therapy: Secondary | ICD-10-CM | POA: Diagnosis not present

## 2024-04-04 DIAGNOSIS — A040 Enteropathogenic Escherichia coli infection: Secondary | ICD-10-CM | POA: Insufficient documentation

## 2024-04-04 DIAGNOSIS — K625 Hemorrhage of anus and rectum: Secondary | ICD-10-CM | POA: Diagnosis not present

## 2024-04-04 DIAGNOSIS — E876 Hypokalemia: Secondary | ICD-10-CM | POA: Diagnosis present

## 2024-04-04 DIAGNOSIS — I10 Essential (primary) hypertension: Secondary | ICD-10-CM | POA: Insufficient documentation

## 2024-04-04 DIAGNOSIS — I421 Obstructive hypertrophic cardiomyopathy: Secondary | ICD-10-CM | POA: Diagnosis present

## 2024-04-04 DIAGNOSIS — F1090 Alcohol use, unspecified, uncomplicated: Secondary | ICD-10-CM | POA: Diagnosis not present

## 2024-04-04 DIAGNOSIS — R197 Diarrhea, unspecified: Secondary | ICD-10-CM | POA: Diagnosis present

## 2024-04-04 DIAGNOSIS — K529 Noninfective gastroenteritis and colitis, unspecified: Secondary | ICD-10-CM

## 2024-04-04 DIAGNOSIS — I428 Other cardiomyopathies: Secondary | ICD-10-CM | POA: Diagnosis not present

## 2024-04-04 DIAGNOSIS — I1A0 Resistant hypertension: Secondary | ICD-10-CM | POA: Diagnosis present

## 2024-04-04 DIAGNOSIS — A0472 Enterocolitis due to Clostridium difficile, not specified as recurrent: Secondary | ICD-10-CM

## 2024-04-04 DIAGNOSIS — K449 Diaphragmatic hernia without obstruction or gangrene: Secondary | ICD-10-CM | POA: Diagnosis not present

## 2024-04-04 DIAGNOSIS — N179 Acute kidney failure, unspecified: Secondary | ICD-10-CM | POA: Insufficient documentation

## 2024-04-04 DIAGNOSIS — I480 Paroxysmal atrial fibrillation: Secondary | ICD-10-CM | POA: Insufficient documentation

## 2024-04-04 LAB — GASTROINTESTINAL PANEL BY PCR, STOOL (REPLACES STOOL CULTURE)

## 2024-04-04 LAB — COMPREHENSIVE METABOLIC PANEL WITH GFR
ALT: 34 U/L (ref 0–44)
AST: 29 U/L (ref 15–41)
Albumin: 4.7 g/dL (ref 3.5–5.0)
Alkaline Phosphatase: 58 U/L (ref 38–126)
Anion gap: 13 (ref 5–15)
BUN: 29 mg/dL — ABNORMAL HIGH (ref 8–23)
CO2: 25 mmol/L (ref 22–32)
Calcium: 10.2 mg/dL (ref 8.9–10.3)
Chloride: 102 mmol/L (ref 98–111)
Creatinine, Ser: 1.68 mg/dL — ABNORMAL HIGH (ref 0.61–1.24)
GFR, Estimated: 44 mL/min — ABNORMAL LOW
Glucose, Bld: 110 mg/dL — ABNORMAL HIGH (ref 70–99)
Potassium: 4.2 mmol/L (ref 3.5–5.1)
Sodium: 140 mmol/L (ref 135–145)
Total Bilirubin: 0.5 mg/dL (ref 0.0–1.2)
Total Protein: 7.3 g/dL (ref 6.5–8.1)

## 2024-04-04 LAB — CBC
HCT: 44.3 % (ref 39.0–52.0)
Hemoglobin: 15.2 g/dL (ref 13.0–17.0)
MCH: 29.5 pg (ref 26.0–34.0)
MCHC: 34.3 g/dL (ref 30.0–36.0)
MCV: 85.9 fL (ref 80.0–100.0)
Platelets: 214 K/uL (ref 150–400)
RBC: 5.16 MIL/uL (ref 4.22–5.81)
RDW: 13.1 % (ref 11.5–15.5)
WBC: 14.7 K/uL — ABNORMAL HIGH (ref 4.0–10.5)
nRBC: 0 % (ref 0.0–0.2)

## 2024-04-04 LAB — C DIFFICILE QUICK SCREEN W PCR REFLEX
C Diff antigen: POSITIVE — AB
C Diff toxin: NEGATIVE

## 2024-04-04 LAB — OCCULT BLOOD X 1 CARD TO LAB, STOOL: Fecal Occult Bld: POSITIVE — AB

## 2024-04-04 LAB — CLOSTRIDIUM DIFFICILE BY PCR, REFLEXED
Hypervirulent Strain: NEGATIVE
Toxigenic C. Difficile by PCR: POSITIVE — AB

## 2024-04-04 LAB — LIPASE, BLOOD: Lipase: 120 U/L — ABNORMAL HIGH (ref 11–51)

## 2024-04-04 MED ORDER — AMLODIPINE BESYLATE 10 MG PO TABS
10.0000 mg | ORAL_TABLET | Freq: Every day | ORAL | Status: DC
  Administered 2024-04-04 – 2024-04-05 (×2): 10 mg via ORAL
  Filled 2024-04-04 (×3): qty 1

## 2024-04-04 MED ORDER — ACETAMINOPHEN 650 MG RE SUPP: 650.0000 mg | Freq: Four times a day (QID) | RECTAL | Status: DC | PRN

## 2024-04-04 MED ORDER — ONDANSETRON HCL 4 MG PO TABS: 4.0000 mg | ORAL_TABLET | Freq: Four times a day (QID) | ORAL | Status: DC | PRN

## 2024-04-04 MED ORDER — ONDANSETRON HCL 4 MG/2ML IJ SOLN: 4.0000 mg | Freq: Four times a day (QID) | INTRAMUSCULAR | Status: DC | PRN

## 2024-04-04 MED ORDER — ACETAMINOPHEN 325 MG PO TABS
650.0000 mg | ORAL_TABLET | Freq: Four times a day (QID) | ORAL | Status: DC | PRN
  Administered 2024-04-05: 650 mg via ORAL
  Filled 2024-04-04: qty 2

## 2024-04-04 MED ORDER — AZITHROMYCIN 250 MG PO TABS
500.0000 mg | ORAL_TABLET | Freq: Every day | ORAL | Status: DC
  Administered 2024-04-04 – 2024-04-05 (×2): 500 mg via ORAL
  Filled 2024-04-04 (×2): qty 2

## 2024-04-04 MED ORDER — HYDRALAZINE HCL 20 MG/ML IJ SOLN: 5.0000 mg | Freq: Four times a day (QID) | INTRAMUSCULAR | Status: DC | PRN

## 2024-04-04 MED ORDER — MAVACAMTEN 10 MG PO CAPS
10.0000 mg | ORAL_CAPSULE | Freq: Every day | ORAL | Status: DC
  Administered 2024-04-05: 10 mg via ORAL
  Filled 2024-04-04 (×3): qty 1

## 2024-04-04 MED ORDER — TRAZODONE HCL 50 MG PO TABS
50.0000 mg | ORAL_TABLET | Freq: Every evening | ORAL | Status: DC | PRN
  Administered 2024-04-04: 50 mg via ORAL
  Administered 2024-04-05: 100 mg via ORAL
  Filled 2024-04-04 (×2): qty 2

## 2024-04-04 MED ORDER — OMEGA-3-ACID ETHYL ESTERS 1 G PO CAPS
2.0000 g | ORAL_CAPSULE | Freq: Two times a day (BID) | ORAL | Status: DC
  Administered 2024-04-04 – 2024-04-06 (×4): 2 g via ORAL
  Filled 2024-04-04 (×4): qty 2

## 2024-04-04 MED ORDER — MAVACAMTEN 10 MG PO CAPS: 10.0000 mg | ORAL_CAPSULE | Freq: Every day | ORAL | Status: DC

## 2024-04-04 MED ORDER — ROSUVASTATIN CALCIUM 20 MG PO TABS
20.0000 mg | ORAL_TABLET | Freq: Every day | ORAL | Status: DC
  Administered 2024-04-04 – 2024-04-05 (×2): 20 mg via ORAL
  Filled 2024-04-04 (×3): qty 1

## 2024-04-04 MED ORDER — SODIUM CHLORIDE 0.9 % IV SOLN: INTRAVENOUS | Status: DC

## 2024-04-04 MED ORDER — ORAL CARE MOUTH RINSE: 15.0000 mL | OROMUCOSAL | Status: DC | PRN

## 2024-04-04 MED ORDER — LOPERAMIDE HCL 2 MG PO CAPS
4.0000 mg | ORAL_CAPSULE | ORAL | Status: DC | PRN
  Administered 2024-04-04: 4 mg via ORAL
  Filled 2024-04-04: qty 2

## 2024-04-04 MED ORDER — IOHEXOL 350 MG/ML SOLN
100.0000 mL | Freq: Once | INTRAVENOUS | Status: AC | PRN
  Administered 2024-04-04: 80 mL via INTRAVENOUS

## 2024-04-04 MED ORDER — COQ10 100 MG PO CAPS: 100.0000 mg | ORAL_CAPSULE | Freq: Every morning | ORAL | Status: DC

## 2024-04-04 MED ORDER — FIDAXOMICIN 200 MG PO TABS
200.0000 mg | ORAL_TABLET | Freq: Two times a day (BID) | ORAL | Status: DC
  Administered 2024-04-04 – 2024-04-05 (×2): 200 mg via ORAL
  Filled 2024-04-04 (×2): qty 1

## 2024-04-04 MED ORDER — SODIUM CHLORIDE 0.9 % IV BOLUS
500.0000 mL | Freq: Once | INTRAVENOUS | Status: AC
  Administered 2024-04-04: 500 mL via INTRAVENOUS

## 2024-04-04 MED ORDER — METOPROLOL SUCCINATE ER 25 MG PO TB24
25.0000 mg | ORAL_TABLET | Freq: Every day | ORAL | Status: DC
  Administered 2024-04-04 – 2024-04-06 (×3): 25 mg via ORAL
  Filled 2024-04-04 (×3): qty 1

## 2024-04-04 MED ORDER — GABAPENTIN 300 MG PO CAPS
300.0000 mg | ORAL_CAPSULE | Freq: Every day | ORAL | Status: DC
  Administered 2024-04-04 – 2024-04-05 (×2): 300 mg via ORAL
  Filled 2024-04-04 (×2): qty 1

## 2024-04-04 NOTE — Assessment & Plan Note (Addendum)
 Suspect secondary to c diff colitis Treatment with fidaxomicin  200 mg p.o. twice daily, 10-day course initiated on admission Patient is maintaining appropriate MAP though with acute kidney injury and suspect prerenal Sodium chloride  125 mL/h, 1 day ordered Recheck CBC, BMP in the a.m.

## 2024-04-04 NOTE — ED Provider Notes (Signed)
 " [place] EMERGENC[facility] DEPARTMENT AT [facility] Provider Note   CSN: [id number] Arrival date & time: 04/04/24  9078     Patient presents with: Rectal Bleeding and Diarrhea   Alan Spencer is a 69 y.o. male.  Patient with past medical history of hypertension, hyperlipidemia, obstructive sleep apnea, hypertrophic cardiomyopathy, atrial fibrillation on chronic anticoagulation of Eliquis .  Patient reports that he has had abdominal cramping which is generalized and bowel movement every 15 to 30 minutes for about 12 hours.  He has had brown stool with some darker blood and bright red blood per rectum.  He is on Eliquis .  He reports he had a similar episode about 3 weeks ago that was much less severe.  No associated nausea and vomiting, he is tolerating oral intake.  No recent antibiotic use, no recent travel out of the country, no suspicious food intake.    Rectal Bleeding Diarrhea      Prior to Admission medications  Medication Sig Start Date End Date Taking? Authorizing Provider  Cholecalciferol  (VITAMIN D) 1.25 MG (50000 UT) CAPS Take 50,000 Units by mouth once a week. 04/13/23  Yes [provider]  acetaminophen  (TYLENOL ) 500 MG tablet Take 500 mg by mouth as needed.    [provider]  amLODipine  (NORVASC ) 10 MG tablet Take 1 tablet (10 mg total) by mouth daily. 04/20/23     amoxicillin  (AMOXIL ) 500 MG capsule Take 4 capsules (2000 mg) by mouth 1 hour prior to dental work, including cleanings. 04/28/23   Santo Stanly LABOR, MD  apixaban  (ELIQUIS ) 5 MG TABS tablet Take 1 tablet (5 mg total) by mouth 2 (two) times daily. 07/27/23     bimatoprost (LUMIGAN) 0.01 % SOLN Place 1 drop into both eyes daily as needed (redness/irritation.).    [provider]  BIOTIN PO Take 1 tablet by mouth every evening. chewable    [provider]  Cholecalciferol  (VITAMIN D3) 125 MCG (5000 UT) CAPS Take 1 capsule (5,000 Units total) by mouth 2 (two) times a week.  04/13/23     Coenzyme Q10 (COQ10) 100 MG CAPS Take 100 mg by mouth in the morning.    [provider]  fluticasone  (FLONASE ) 50 MCG/ACT nasal spray Place 1 spray into both nostrils daily as needed for allergies. 08/11/17   [provider]  gabapentin  (NEURONTIN ) 300 MG capsule Take 1 capsule (300 mg total) by mouth at bedtime. 10/30/23   Cosentino, Isaiah SAUNDERS, PA-C  gabapentin  (NEURONTIN ) 300 MG capsule Take 1 capsule (300 mg total) by mouth daily. 02/23/24     glucosamine-chondroitin 500-400 MG tablet Take 1 tablet by mouth daily.    [provider]  hydrALAZINE  (APRESOLINE ) 25 MG tablet Take 1 tablet (25 mg total) by mouth 3 (three) times daily as needed (for blood pressure above 160). 02/05/22   Raford Riggs, MD  hydrochlorothiazide  (MICROZIDE ) 12.5 MG capsule Take 1 capsule (12.5 mg total) by mouth daily. 12/04/23   Chandrasekhar, Stanly A, MD  hydrocortisone  2.5 % cream Apply 1 Application to affected area topically at bedtime. 01/12/24     irbesartan -hydrochlorothiazide  (AVALIDE) 300-12.5 MG tablet Take 1 tablet by mouth daily. 06/01/23     mavacamten  (CAMZYOS ) 10 MG CAPS capsule TAKE 1 CAPSULE BY MOUTH DAILY 03/11/24   Chandrasekhar, Mahesh A, MD  meloxicam  (MOBIC ) 15 MG tablet Take 1 tablet (15 mg total) by mouth daily as needed for pain. 08/04/23     metoprolol  succinate (TOPROL  XL) 25 MG 24 hr tablet Take  1 tablet (25 mg total) by mouth daily. 05/07/23   Santo Kelly A, MD  naproxen sodium (ALEVE) 220 MG tablet Take 440 mg by mouth 2 (two) times daily as needed (pain.).    [provider]  neomycin -polymyxin-dexameth (MAXITROL ) 0.1 % OINT Apply 1/4 inch to both eyes at bedtime for 1 week. 05/01/23     olopatadine  (PATADAY ) 0.1 % ophthalmic solution Place 1 drop into both eyes daily as needed for allergies.    [provider]  omega-3 acid ethyl esters (LOVAZA ) 1 g capsule Take 2 g by mouth 2 (two) times daily.    [provider]   rosuvastatin  (CRESTOR ) 20 MG tablet Take 1 tablet (20 mg total) by mouth daily. 07/20/23     sildenafil (VIAGRA) 50 MG tablet as needed for erectile dysfunction. 12/28/22   [provider]  spironolactone  (ALDACTONE ) 25 MG tablet Take 1 tablet (25 mg total) by mouth daily. 01/11/24   Court Dorn PARAS, MD  tirzepatide  (ZEPBOUND ) 2.5 MG/0.5ML Pen Inject 2.5 mg into the skin every 7 (seven) days. 04/08/23     tirzepatide  (ZEPBOUND ) 2.5 MG/0.5ML Pen Inject 2.5 mg into the skin once a week. 01/12/24     tirzepatide  (ZEPBOUND ) 5 MG/0.5ML Pen Inject 5 mg into the skin every 7 (seven) days. 04/08/23     traMADol  (ULTRAM ) 50 MG tablet Take 1 tablet (50 mg total) by mouth 2 (two) times daily as needed for moderate pain. 07/31/23     TRIAMCINOLONE ACETONIDE EX Apply 1 Application topically daily as needed (skin irritation/rash).    [provider]    Allergies: Patient has no known allergies.    Review of Systems  Gastrointestinal:  Positive for diarrhea and hematochezia.    Updated Vital Signs BP (!) 149/84   Pulse 72   Temp 98.2 F (36.8 C) (Oral)   Resp 18   SpO2 100%   Physical Exam Vitals and nursing note reviewed.  Constitutional:      General: He is not in acute distress.    Appearance: He is not toxic-appearing.  HENT:     Head: Normocephalic and atraumatic.  Eyes:     General: No scleral icterus.    Conjunctiva/sclera: Conjunctivae normal.  Cardiovascular:     Rate and Rhythm: Normal rate and regular rhythm.     Pulses: Normal pulses.     Heart sounds: Normal heart sounds.  Pulmonary:     Effort: Pulmonary effort is normal. No respiratory distress.     Breath sounds: Normal breath sounds.  Abdominal:     General: Abdomen is flat. Bowel sounds are normal.     Palpations: Abdomen is soft.     Tenderness: There is no abdominal tenderness.  Genitourinary:    Rectum: Guaiac result positive.  Skin:    General: Skin is warm and dry.     Findings: No lesion.   Neurological:     General: No focal deficit present.     Mental Status: He is alert and oriented to person, place, and time. Mental status is at baseline.     (all labs ordered are listed, but only abnormal results are displayed) Labs Reviewed  LIPASE, BLOOD - Abnormal; Notable for the following components:      Result Value   Lipase 120 (*)    All other components within normal limits  COMPREHENSIVE METABOLIC PANEL WITH GFR - Abnormal; Notable for the following components:   Glucose, Bld 110 (*)    BUN 29 (*)  Creatinine, Ser 1.68 (*)    GFR, Estimated 44 (*)    All other components within normal limits  CBC - Abnormal; Notable for the following components:   WBC 14.7 (*)    All other components within normal limits  OCCULT BLOOD X 1 CARD TO LAB, STOOL - Abnormal; Notable for the following components:   Fecal Occult Bld POSITIVE (*)    All other components within normal limits  C DIFFICILE QUICK SCREEN W PCR REFLEX    GASTROINTESTINAL PANEL BY PCR, STOOL (REPLACES STOOL CULTURE)  POC OCCULT BLOOD, ED    EKG: None  Radiology: CT ANGIO GI BLEED Result Date: 04/04/2024 EXAM: CTA ABDOMEN AND PELVIS WITH CONTRAST 04/04/2024 10:34:03 AM TECHNIQUE: CTA images of the abdomen and pelvis with intravenous contrast. Three-dimensional MIP/volume rendered formations were performed. Automated exposure control, iterative reconstruction, and/or weight based adjustment of the mA/kV was utilized to reduce the radiation dose to as low as reasonably achievable. COMPARISON: MRI of the abdomen dated 11/21/2021. CLINICAL HISTORY: GI bleed. FINDINGS: VASCULATURE: GI BLEED: There is no convincing evidence of gastrointestinal tract hemorrhage. AORTA: The abdominal aorta is normal in caliber and demonstrates mild-to-moderate calcific atheromatous disease. No abdominal aortic aneurysm. No dissection. CELIAC TRUNK: No acute finding. No occlusion or significant stenosis. SUPERIOR MESENTERIC ARTERY: No acute  finding. No occlusion or significant stenosis. INFERIOR MESENTERIC ARTERY: No acute finding. No occlusion or significant stenosis. RENAL ARTERIES: No acute finding. No occlusion or significant stenosis. ILIAC ARTERIES: No acute finding. No occlusion or significant stenosis. ABDOMEN/PELVIS: LOWER CHEST: Visualized portion of the lower chest demonstrates no acute abnormality. LIVER: The liver is unremarkable. GALLBLADDER AND BILE DUCTS: Gallbladder is unremarkable. No biliary ductal dilatation. SPLEEN: The spleen is unremarkable. PANCREAS: The pancreas is unremarkable. ADRENAL GLANDS: Bilateral adrenal glands demonstrate no acute abnormality. KIDNEYS, URETERS AND BLADDER: There is a nonobstructive calculus in the superior pole of the right kidney, measuring about 5 mm in diameter. There is also a simple right renal cyst present, measuring approximately 3.6 cm. No hydronephrosis. No perinephric or periureteral stranding. Urinary bladder is unremarkable. GI AND BOWEL: The stomach and small bowel are unremarkable. There is diffuse abnormal thickening of the wall of the sigmoid colon and mid to distal descending colon. There are several scattered colonic diverticula also present. There is no bowel obstruction. No abnormal bowel wall thickening or distension. REPRODUCTIVE: Reproductive organs are unremarkable. PERITONEUM AND RETROPERITONEUM: There are bilateral fat-containing inguinal hernias present, left greater than right. No ascites or free air. LYMPH NODES: No lymphadenopathy. BONES AND SOFT TISSUES: No acute abnormality of the bones. No acute soft tissue abnormality. IMPRESSION: 1. No convincing evidence of gastrointestinal tract hemorrhage. 2. Diffuse abnormal thickening of the wall of the sigmoid colon and mid to distal descending colon with several scattered colonic diverticula. The findings are compatible with colitis. 3. Nonobstructive calculus in the superior pole of the right kidney, measuring about 5 mm in  diameter. 4. Simple right renal cyst measuring approximately 3.6 cm. No follow-up imaging is recommended. 5. Bilateral fat-containing inguinal hernias, left greater than right. Electronically signed by: Evalene Coho MD 04/04/2024 11:47 AM EST RP Workstation: [id number]     Procedures   Medications Ordered in the ED  loperamide  (IMODIUM ) capsule 4 mg (4 mg Oral Given 04/04/24 1035)  sodium chloride  0.9 % bolus 500 mL (has no administration in time range)  sodium chloride  0.9 % bolus 500 mL (0 mLs Intravenous Stopped 04/04/24 1136)  iohexol  (OMNIPAQUE ) 350 MG/ML injection 100 mL (80  mLs Intravenous Contrast Given 04/04/24 1026)                                    Medical Decision Making Amount and/or Complexity of Data Reviewed Labs: ordered. Radiology: ordered.  Risk Prescription drug management. Decision regarding hospitalization.   This patient presents to the ED for concern of abdominal pain, this involves an extensive number of treatment options, and is a complaint that carries with it a high risk of complications and morbidity.  The differential diagnosis includes cholecystitis, AAA, appendicitis, renal stone, UTI   Co morbidities that complicate the patient evaluation  HTN, HLD, OSA, Afib on eliquis     Lab Tests:  I personally interpreted labs.  The pertinent results include:   Patient's lab work is remarkable for a leukocytosis at 14.7, hemoglobin is 15.2 CMP shows elevated creatinine to 1.68 which is higher than baseline (about 1.3) Lipase is mildly elevated Hemoccult positive with obvious gross red blood on exam C. difficile and GI panel are pending   Imaging Studies ordered:  I ordered imaging studies including CT angio GI bleed I independently visualized and interpreted imaging which showed no obvious GI tract hemorrhage, it does show diffuse abnormal thickening of the sigmoid colon and distal descending colon concerning for colitis I agree with the  radiologist interpretation   Cardiac Monitoring: / EKG:  The patient was maintained on a cardiac monitor   Consultations Obtained:  I requested consultation with the GI [facility] and admission team,  and discussed lab and imaging findings as well as pertinent plan.   Problem List / ED Course / Critical interventions / Medication management  Patient presents emergency room with complaint of severe diarrhea associate with blood in the stool.  He is having some mild lightheadedness/dizziness with this.  On exam he has generalized tenderness but no rebound tenderness.  He is Hemoccult positive with gross red blood in the stool.  He is anticoagulated.  His vitals have been stable throughout stay.  His hemoglobin is within normal limits at this time.  He was found to have an AKI and continues to have loose bloody stools throughout stay in ED.  Given that he is anticoagulated and continues to have rectal bleeding I did reach out to GI who will see the patient in consult.  Patient will be admitted to [facility].  Given fluids for AKI.     Final diagnoses:  Gastrointestinal hemorrhage, unspecified gastrointestinal hemorrhage type  Colitis  AKI (acute kidney injury)    ED Discharge Orders     None          Shermon Warren SAILOR, PA-C 04/04/24 1514  "

## 2024-04-04 NOTE — Progress Notes (Signed)
" °   04/04/24 2341  BiPAP/CPAP/SIPAP  $ Non-Invasive Ventilator  Non-Invasive Vent Initial  BiPAP/CPAP/SIPAP Pt Type Adult  BiPAP/CPAP/SIPAP Resmed  Mask Type Nasal mask  Dentures removed? Not applicable  Mask Size Large  FiO2 (%) 21 %  Patient Home Machine No  Patient Home Mask No  Patient Home Tubing No  Auto Titrate Yes  CPAP/SIPAP surface wiped down Yes  Device Plugged into RED Power Outlet Yes  BiPAP/CPAP /SiPAP Vitals  Pulse Rate 82  Resp 20  Bilateral Breath Sounds Clear  MEWS Score/Color  MEWS Score 0  MEWS Score Color Green    "

## 2024-04-04 NOTE — Hospital Course (Addendum)
 Mr. Kayla Weekes is a 69 year old male with history of CKD stage IIIa, hypertension, hyperlipidemia, OSA, HOCM atrial fibrillation on Eliquis .  04/04/2024: Patient presented to the ED for chief concerns of diarrhea, with bright red blood per rectum.  Vitals at the time of my evaluation showed t of 98.2, rr of 18, hr 72, blood pressure 149/84, SpO2 100% on room air.  Serum sodium is 140, potassium 4.2, chloride 102, bicarb 25, BUN of 29, serum creatinine 1.68, eGFR 44, nonfasting blood glucose 110, WBC 14.7, hemoglobin 15.2 platelets of 214.  Lipase was 120.  Fecal occult was positive.  ED treatment: Sodium chloride  500 mL bolus, x 2.  04/04/2024: Patient admitted to hospitalist service for chief concerns of bright red blood per rectum and monitoring.  Patient was assessed and admission completed via telemetry encounter.

## 2024-04-04 NOTE — Progress Notes (Signed)
 Pt friend brought in medication  mavacamten (CAMZYOS ) from home. This med is not scheduled for pt to take. Called pharmacy to verify medication  but they also not aware of this. This RN sent medication to pharmacy. Pt is aware.

## 2024-04-04 NOTE — ED Notes (Signed)
Hemoccult card at bedside 

## 2024-04-04 NOTE — ED Notes (Signed)
 Called Kim at [facility] for transport 15:15-TC

## 2024-04-04 NOTE — Assessment & Plan Note (Addendum)
 With leukocytosis Mild to moderate, with WBC and renal involvement Fidaxomicin  200 mg PO BID for 10 days initiated on admission Enteric precaution initiated RN and spouse at bedside are aware Maintain MAP > 65.  Patient is hemodynamically stable Recheck CBC in the a.m.

## 2024-04-04 NOTE — Progress Notes (Signed)
 Mavacamten  Medication REMS Program  Pharmacy consult for Continuation of Mavacamten   Indication - Continuation of prior to admission medication  Alan Spencer is a 69 y.o. year old male with history of HCM on chronic mavacamten  PTA to be continued while hospitalized.   Continuing this medication order as an inpatient requires that monitoring parameters per REMS requirements be met.  Chronic therapy is under the supervision of Stanly Leavens MD (Provider name) who is enrolled in the REMS program.  Pregnancy risk- N/A (male patient) Heart function has been evaluated and EF is > 50% and appropriate for continued treatment. Last Echocardiogram was on 12/31/23 with EF of 65% Next scheduled Echocardiogram is due 06/17/24 (every 6 months per MD note). Last patient status form submitted to REMS program on unknown. All current medication orders have been evaluated for drug-drug interactions and No serious drug-drug interactions were identified and medications were continued. All new medication orders will be screened for drug-drug interactions with mavacamten  prior to verification.  If any questions arise or new reduced EF is identified during hospitalization, contact the  mavacamten  REMS program: [phone] or [email or web address].  Thank you for allowing us  to participate in the care of this patient.    Rosaline Millet PharmD 04/04/2024, 6:33 PM

## 2024-04-04 NOTE — Assessment & Plan Note (Signed)
 Rosuvastatin  20 mg daily

## 2024-04-04 NOTE — Assessment & Plan Note (Addendum)
 Usually self-limiting ID consulted and recommends azithromycin  500 mg p.o. x 5 days; ordered

## 2024-04-04 NOTE — Assessment & Plan Note (Signed)
 Cpap at bedtime ordered

## 2024-04-04 NOTE — ED Triage Notes (Signed)
 Pt presents c/o GI bleed since last night. Reports diarrhea w/ bright red and darker blood per rectum. Reports BM q10-15 mins w/ blood. Spoke w/ PCP who recommended to come to ER. Reports hx same but less severe ~ 3 weeks ago. Some lightheadedness and lower abd cramping.

## 2024-04-04 NOTE — Assessment & Plan Note (Signed)
 Home amlodipine  10 mg daily, metoprolol  succinate 25 mg daily resumed on admission Hydralazine  5 mg IV every 5 hours as needed for SBP > 165, 5 days ordered

## 2024-04-04 NOTE — ED Notes (Signed)
 Called Nataya at [facility] to cancel transport. Pt going POV

## 2024-04-04 NOTE — Assessment & Plan Note (Addendum)
 Home mavacamten  10 mg daily resumed

## 2024-04-04 NOTE — H&P (Addendum)
 " History and Physical - Telemedicine  QUANELL LOUGHNEY FMW:[id number] DOB: 02-15-1956 DOA: 04/04/2024  PCP: Yolande Toribio MATSU, MD  Patient coming from: home  Referring provider: Warren Shad, PA EDP Telemedicine provider: Dr. Sherre Patient location: [facility] Referring diagnosis: BRBPR Patient name and DOB verified: Patient was able to verify her first and last name: Alan Spencer, date of birth: 06-19-1955.  Patient consented to Telemedicine Evaluation: yes Virtual assistant: Grayce Sayres, paramedic Video encounter time and date: 01/19/26and at approximately: 14:29  Chief Concern: Diarrhea with bright red blood per rectum  HPI: Mr. Yeriel Mineo is a 69 year old male with history of CKD stage IIIa, hypertension, hyperlipidemia, OSA, HOCM atrial fibrillation on Eliquis .  04/04/2024: Patient presented to the ED for chief concerns of diarrhea, with bright red blood per rectum.  Vitals at the time of my evaluation showed t of 98.2, rr of 18, hr 72, blood pressure 149/84, SpO2 100% on room air.  Serum sodium is 140, potassium 4.2, chloride 102, bicarb 25, BUN of 29, serum creatinine 1.68, eGFR 44, nonfasting blood glucose 110, WBC 14.7, hemoglobin 15.2 platelets of 214.  Lipase was 120.  Fecal occult was positive.  ED treatment: Sodium chloride  500 mL bolus, x 2.  04/04/2024: Patient admitted to hospitalist service for chief concerns of bright red blood per rectum and monitoring.  Patient was assessed and admission completed via telemetry encounter. ------------------------------------ At bedside, via telemedicine encounter, patient was able to confirm his first last name and date of birth.  He reports he started having bright red blood per rectum and diarrhea today he had to go have a bowel movement every 15 to 20 minutes.  There is stool with blood in it and every time he has a bowel movement blood comes out of his rectum.  He denies changes to his diet, medication.  He denies recent  antibiotic use.  He denies known trauma to his person, known sick contacts. He reports abdominal pain and cramping this has improved about 2 hours ago.  He reports he has never had pain like this before.  He endorses chills denies cough, burning with urination, fever, chills, dizziness.  Social history: He lives at home with his wife.  He denies tobacco, EtOH, recreational drug use.  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  ROS: Constitutional: no weight change, no fever, + chills ENT/Mouth: no sore throat, no rhinorrhea Eyes: no eye pain, no vision changes Cardiovascular: no chest pain, no dyspnea,  no edema, no palpitations Respiratory: no cough, no sputum, no wheezing Gastrointestinal: no nausea, no vomiting, + diarrhea, no constipation, + BRBPR Genitourinary: no urinary incontinence, no dysuria, no hematuria Musculoskeletal: no arthralgias, no myalgias Skin: no skin lesions, no pruritus, Neuro: + weakness, no loss of consciousness, no syncope Psych: no anxiety, no depression, no decrease appetite Heme/Lymph: no bruising, no bleeding  ED Course: Discussed with EDP, patient requiring hospitalization for chief concerns of diarrhea, bright red blood per rectum.  Assessment/Plan  Principal Problem:   Bright red blood per rectum Active Problems:   C. difficile colitis   Resistant hypertension   Mixed hyperlipidemia   OSA on CPAP   Obstructive hypertrophic cardiomyopathy (HCC)   Enteritis, enteropathogenic E. coli   Assessment and Plan:  * Bright red blood per rectum Suspect secondary to c diff colitis Treatment with fidaxomicin  200 mg p.o. twice daily, 10-day course initiated on admission Patient is maintaining appropriate MAP though with acute kidney injury and suspect prerenal Sodium chloride  125  mL/h, 1 day ordered Recheck CBC, BMP in the a.m.  C. difficile colitis With leukocytosis Mild to moderate, with WBC and renal involvement Fidaxomicin  200  mg PO BID for 10 days initiated on admission Enteric precaution initiated RN and spouse at bedside are aware Maintain MAP > 65.  Patient is hemodynamically stable Recheck CBC in the a.m.  Resistant hypertension Home amlodipine  10 mg daily, metoprolol  succinate 25 mg daily resumed on admission Hydralazine  5 mg IV every 5 hours as needed for SBP > 165, 5 days ordered  OSA on CPAP Cpap at bedtime ordered  Mixed hyperlipidemia Rosuvastatin  20 mg daily  Enteritis, enteropathogenic E. coli Usually self-limiting ID consulted and recommends azithromycin  500 mg p.o. x 5 days; ordered  Obstructive hypertrophic cardiomyopathy (HCC) Home mavacamten  10 mg daily resumed  Chart reviewed.   DVT prophylaxis: Pharmacologic DVT not initiated on admission.  AM team to initiate pharmacologic DVT when the benefits outweigh the risk.  Code Status: Full code Diet: Clear liquid diet with orders to advance as tolerated to full liquid diet Family Communication: Spouse at bedside has been updated Disposition Plan: Pending clinical course Consults called: None at this time Admission status: Telemetry, observation  Past Medical History:  Diagnosis Date   Anxiety    Essential hypertension, benign    Excessive daytime sleepiness 07/03/2015   Family history of colon cancer    HNP (herniated nucleus pulposus), lumbar    L4-L5   Mixed hyperlipidemia    Obesity (BMI 30-39.9) 01/10/2018   Obstructive hypertrophic cardiomyopathy (HCC) 11/07/2021   Rectal bleeding    Resistant hypertension    Essential hypertension   Sleep apnea    uses CPAP   Past Surgical History:  Procedure Laterality Date   ATRIAL SEPTAL DEFECT(ASD) CLOSURE N/A 07/24/2022   Procedure: ATRIAL SEPTAL DEFECT(ASD) CLOSURE;  Surgeon: Wonda Sharper, MD;  Location: [facility];  Service: Cardiovascular;  Laterality: N/A;   broken left elbow     NASAL SEPTUM SURGERY  2022   PARATHYROIDECTOMY N/A 12/27/2020   Procedure: NECK  EXPLORATION WITH BIOPSY OF PARATHYROID  GLAND x3;  Surgeon: Eletha Boas, MD;  Location: [facility];  Service: General;  Laterality: N/A;   TEE WITHOUT CARDIOVERSION N/A 06/13/2022   Procedure: TRANSESOPHAGEAL ECHOCARDIOGRAM (TEE);  Surgeon: Santo Stanly LABOR, MD;  Location: [facility] ENDOSCOPY;  Service: Cardiovascular;  Laterality: N/A;   TONSILLECTOMY     Social History:  reports that he has never smoked. He has never used smokeless tobacco. He reports current alcohol use. He reports that he does not use drugs.  Allergies[1] Family History  Problem Relation Age of Onset   Hypertension Mother    Hypertrophic cardiomyopathy Mother    Kidney failure Father    Colon cancer Paternal Grandmother    Family history: Family history reviewed and not pertinent.  Prior to Admission medications  Medication Sig Start Date End Date Taking? Authorizing Provider  Cholecalciferol  (VITAMIN D) 1.25 MG (50000 UT) CAPS Take 50,000 Units by mouth once a week. 04/13/23  Yes [provider]  acetaminophen  (TYLENOL ) 500 MG tablet Take 500 mg by mouth as needed.    [provider]  amLODipine  (NORVASC ) 10 MG tablet Take 1 tablet (10 mg total) by mouth daily. 04/20/23     amoxicillin  (AMOXIL ) 500 MG capsule Take 4 capsules (2000 mg) by mouth 1 hour prior to dental work, including cleanings. 04/28/23   Chandrasekhar, Stanly LABOR, MD  apixaban  (ELIQUIS ) 5 MG TABS tablet Take 1 tablet (5 mg total)  by mouth 2 (two) times daily. 07/27/23     bimatoprost (LUMIGAN) 0.01 % SOLN Place 1 drop into both eyes daily as needed (redness/irritation.).    [provider]  BIOTIN PO Take 1 tablet by mouth every evening. chewable    [provider]  Cholecalciferol  (VITAMIN D3) 125 MCG (5000 UT) CAPS Take 1 capsule (5,000 Units total) by mouth 2 (two) times a week. 04/13/23     Coenzyme Q10 (COQ10) 100 MG CAPS Take 100 mg by mouth in the morning.    [provider]  fluticasone  (FLONASE ) 50 MCG/ACT  nasal spray Place 1 spray into both nostrils daily as needed for allergies. 08/11/17   [provider]  gabapentin  (NEURONTIN ) 300 MG capsule Take 1 capsule (300 mg total) by mouth at bedtime. 10/30/23   Cosentino, Isaiah SAUNDERS, PA-C  gabapentin  (NEURONTIN ) 300 MG capsule Take 1 capsule (300 mg total) by mouth daily. 02/23/24     glucosamine-chondroitin 500-400 MG tablet Take 1 tablet by mouth daily.    [provider]  hydrALAZINE  (APRESOLINE ) 25 MG tablet Take 1 tablet (25 mg total) by mouth 3 (three) times daily as needed (for blood pressure above 160). 02/05/22   Raford Riggs, MD  hydrochlorothiazide  (MICROZIDE ) 12.5 MG capsule Take 1 capsule (12.5 mg total) by mouth daily. 12/04/23   Santo Kelly A, MD  hydrocortisone  2.5 % cream Apply 1 Application to affected area topically at bedtime. 01/12/24     irbesartan -hydrochlorothiazide  (AVALIDE) 300-12.5 MG tablet Take 1 tablet by mouth daily. 06/01/23     mavacamten  (CAMZYOS ) 10 MG CAPS capsule TAKE 1 CAPSULE BY MOUTH DAILY 03/11/24   Chandrasekhar, Mahesh A, MD  meloxicam  (MOBIC ) 15 MG tablet Take 1 tablet (15 mg total) by mouth daily as needed for pain. 08/04/23     metoprolol  succinate (TOPROL  XL) 25 MG 24 hr tablet Take 1 tablet (25 mg total) by mouth daily. 05/07/23   Santo Kelly A, MD  naproxen sodium (ALEVE) 220 MG tablet Take 440 mg by mouth 2 (two) times daily as needed (pain.).    [provider]  neomycin -polymyxin-dexameth (MAXITROL ) 0.1 % OINT Apply 1/4 inch to both eyes at bedtime for 1 week. 05/01/23     olopatadine  (PATADAY ) 0.1 % ophthalmic solution Place 1 drop into both eyes daily as needed for allergies.    [provider]  omega-3 acid ethyl esters (LOVAZA ) 1 g capsule Take 2 g by mouth 2 (two) times daily.    [provider]  rosuvastatin  (CRESTOR ) 20 MG tablet Take 1 tablet (20 mg total) by mouth daily. 07/20/23     sildenafil (VIAGRA) 50 MG tablet as needed for erectile  dysfunction. 12/28/22   [provider]  spironolactone  (ALDACTONE ) 25 MG tablet Take 1 tablet (25 mg total) by mouth daily. 01/11/24   Court Dorn PARAS, MD  tirzepatide  (ZEPBOUND ) 2.5 MG/0.5ML Pen Inject 2.5 mg into the skin every 7 (seven) days. 04/08/23     tirzepatide  (ZEPBOUND ) 2.5 MG/0.5ML Pen Inject 2.5 mg into the skin once a week. 01/12/24     tirzepatide  (ZEPBOUND ) 5 MG/0.5ML Pen Inject 5 mg into the skin every 7 (seven) days. 04/08/23     traMADol  (ULTRAM ) 50 MG tablet Take 1 tablet (50 mg total) by mouth 2 (two) times daily as needed for moderate pain. 07/31/23     TRIAMCINOLONE ACETONIDE EX Apply 1 Application topically daily as needed (skin irritation/rash).    [provider]   Physical Exam completed with  assistance of: Grayce Sayres, paramedic, who was at bedside during this portion of the virtual encounter:  Vitals:   04/04/24 1115 04/04/24 1130 04/04/24 1418 04/04/24 1641  BP: (!) 153/83 (!) 149/84 (!) 149/84 (!) 146/98  Pulse: 71 72 80 94  Resp:  18 18 18   Temp:   98.2 F (36.8 C) 98 F (36.7 C)  TempSrc:   Oral   SpO2: 100% 100% 100% 97%  Weight:    88.3 kg  Height:    5' 8 (1.727 m)   Constitutional: appears age-appropriate, NAD, calm Eyes: EOMI,  conjunctivae normal ENMT: Mucous membranes are moist. Hearing appropriate Neck: normal, supple, no masses, no thyromegaly Respiratory: clear to auscultation bilaterally, no wheezing. Normal respiratory effort. No accessory muscle use.  Cardiovascular: Regular rate and rhythm, no murmurs. No extremity edema. 2+ pedal pulses. Abdomen: no tenderness. Bowel sounds positive.  Musculoskeletal: No joint deformity upper and lower extremities. Good ROM, no contractures, no atrophy. Skin: no rashes, ulcers on visible skin Neurologic: Strength is appropriate upper extremities.  Psychiatric: Normal judgment and insight. Alert and oriented x 3. Normal mood.   EKG: None indicated at this time  Chest x-ray on  Admission: Not indicated at this time  CT ANGIO GI BLEED Result Date: 04/04/2024 EXAM: CTA ABDOMEN AND PELVIS WITH CONTRAST 04/04/2024 10:34:03 AM TECHNIQUE: CTA images of the abdomen and pelvis with intravenous contrast. Three-dimensional MIP/volume rendered formations were performed. Automated exposure control, iterative reconstruction, and/or weight based adjustment of the mA/kV was utilized to reduce the radiation dose to as low as reasonably achievable. COMPARISON: MRI of the abdomen dated 11/21/2021. CLINICAL HISTORY: GI bleed. FINDINGS: VASCULATURE: GI BLEED: There is no convincing evidence of gastrointestinal tract hemorrhage. AORTA: The abdominal aorta is normal in caliber and demonstrates mild-to-moderate calcific atheromatous disease. No abdominal aortic aneurysm. No dissection. CELIAC TRUNK: No acute finding. No occlusion or significant stenosis. SUPERIOR MESENTERIC ARTERY: No acute finding. No occlusion or significant stenosis. INFERIOR MESENTERIC ARTERY: No acute finding. No occlusion or significant stenosis. RENAL ARTERIES: No acute finding. No occlusion or significant stenosis. ILIAC ARTERIES: No acute finding. No occlusion or significant stenosis. ABDOMEN/PELVIS: LOWER CHEST: Visualized portion of the lower chest demonstrates no acute abnormality. LIVER: The liver is unremarkable. GALLBLADDER AND BILE DUCTS: Gallbladder is unremarkable. No biliary ductal dilatation. SPLEEN: The spleen is unremarkable. PANCREAS: The pancreas is unremarkable. ADRENAL GLANDS: Bilateral adrenal glands demonstrate no acute abnormality. KIDNEYS, URETERS AND BLADDER: There is a nonobstructive calculus in the superior pole of the right kidney, measuring about 5 mm in diameter. There is also a simple right renal cyst present, measuring approximately 3.6 cm. No hydronephrosis. No perinephric or periureteral stranding. Urinary bladder is unremarkable. GI AND BOWEL: The stomach and small bowel are unremarkable. There is  diffuse abnormal thickening of the wall of the sigmoid colon and mid to distal descending colon. There are several scattered colonic diverticula also present. There is no bowel obstruction. No abnormal bowel wall thickening or distension. REPRODUCTIVE: Reproductive organs are unremarkable. PERITONEUM AND RETROPERITONEUM: There are bilateral fat-containing inguinal hernias present, left greater than right. No ascites or free air. LYMPH NODES: No lymphadenopathy. BONES AND SOFT TISSUES: No acute abnormality of the bones. No acute soft tissue abnormality. IMPRESSION: 1. No convincing evidence of gastrointestinal tract hemorrhage. 2. Diffuse abnormal thickening of the wall of the sigmoid colon and mid to distal descending colon with several scattered colonic diverticula. The findings are compatible with colitis. 3. Nonobstructive calculus in the superior pole of the right  kidney, measuring about 5 mm in diameter. 4. Simple right renal cyst measuring approximately 3.6 cm. No follow-up imaging is recommended. 5. Bilateral fat-containing inguinal hernias, left greater than right. Electronically signed by: Evalene Coho MD 04/04/2024 11:47 AM EST RP Workstation: [id number]   Labs on Admission: I have personally reviewed following labs.  CBC: Recent Labs  Lab 04/04/24 0942  WBC 14.7*  HGB 15.2  HCT 44.3  MCV 85.9  PLT 214   Basic Metabolic Panel: Recent Labs  Lab 04/04/24 0942  NA 140  K 4.2  CL 102  CO2 25  GLUCOSE 110*  BUN 29*  CREATININE 1.68*  CALCIUM  10.2   GFR: Estimated Creatinine Clearance: 45.5 mL/min (A) (by C-G formula based on SCr of 1.68 mg/dL (H)).  Liver Function Tests: Recent Labs  Lab 04/04/24 0942  AST 29  ALT 34  ALKPHOS 58  BILITOT 0.5  PROT 7.3  ALBUMIN 4.7   Recent Labs  Lab 04/04/24 0942  LIPASE 120*   Urine analysis:    Component Value Date/Time   COLORURINE YELLOW 09/15/2015 0941   APPEARANCEUR TURBID (A) 09/15/2015 0941   LABSPEC 1.019  09/15/2015 0941   PHURINE 7.0 09/15/2015 0941   GLUCOSEU NEGATIVE 09/15/2015 0941   HGBUR NEGATIVE 09/15/2015 0941   BILIRUBINUR NEGATIVE 09/15/2015 0941   KETONESUR NEGATIVE 09/15/2015 0941   PROTEINUR 30 (A) 09/15/2015 0941   NITRITE NEGATIVE 09/15/2015 0941   LEUKOCYTESUR NEGATIVE 09/15/2015 0941   This document was prepared using Dragon Voice Recognition software and may include unintentional dictation errors.  Dr. Sherre [facility] My Location: [place]  If 7PM-7AM, please contact overnight-coverage provider If 7AM-7PM, please contact day attending provider [email or web address]  04/04/2024, 6:20 PM.     [1] No Known Allergies  "

## 2024-04-05 ENCOUNTER — Telehealth (HOSPITAL_COMMUNITY): Payer: Self-pay

## 2024-04-05 ENCOUNTER — Other Ambulatory Visit (HOSPITAL_COMMUNITY): Payer: Self-pay

## 2024-04-05 LAB — BASIC METABOLIC PANEL WITH GFR
Anion gap: 10 (ref 5–15)
BUN: 17 mg/dL (ref 8–23)
CO2: 24 mmol/L (ref 22–32)
Calcium: 8.5 mg/dL — ABNORMAL LOW (ref 8.9–10.3)
Chloride: 106 mmol/L (ref 98–111)
Creatinine, Ser: 1.25 mg/dL — ABNORMAL HIGH (ref 0.61–1.24)
GFR, Estimated: >60 mL/min
Glucose, Bld: 90 mg/dL (ref 70–99)
Potassium: 3.4 mmol/L — ABNORMAL LOW (ref 3.5–5.1)
Sodium: 139 mmol/L (ref 135–145)

## 2024-04-05 LAB — CBC
HCT: 40 % (ref 39.0–52.0)
Hemoglobin: 13.4 g/dL (ref 13.0–17.0)
MCH: 28.8 pg (ref 26.0–34.0)
MCHC: 33.5 g/dL (ref 30.0–36.0)
MCV: 86 fL (ref 80.0–100.0)
Platelets: 180 K/uL (ref 150–400)
RBC: 4.65 MIL/uL (ref 4.22–5.81)
RDW: 13.2 % (ref 11.5–15.5)
WBC: 11 K/uL — ABNORMAL HIGH (ref 4.0–10.5)
nRBC: 0 % (ref 0.0–0.2)

## 2024-04-05 LAB — HIV ANTIBODY (ROUTINE TESTING W REFLEX): HIV Screen 4th Generation wRfx: NONREACTIVE

## 2024-04-05 MED ORDER — POTASSIUM CHLORIDE CRYS ER 20 MEQ PO TBCR
20.0000 meq | EXTENDED_RELEASE_TABLET | Freq: Two times a day (BID) | ORAL | Status: DC
  Administered 2024-04-05 (×2): 20 meq via ORAL
  Filled 2024-04-05 (×2): qty 1

## 2024-04-05 MED ORDER — SODIUM CHLORIDE 0.9 % IV SOLN: INTRAVENOUS | Status: AC

## 2024-04-05 NOTE — Progress Notes (Signed)
 " PROGRESS NOTE    Alan Spencer  FMW:[id number] DOB: 02-Oct-1955 DOA: 04/04/2024 PCP: Yolande Toribio MATSU, MD    Brief Narrative:  69 year old with history of CKD stage IIIa with recent known creatinine of 1.4, hypertension, hyperlipidemia, sleep apnea, HOCM, paroxysmal A-fib on Eliquis  presented to the ER with sudden onset multiple episodes of bright red blood per rectum as well as bloody stool.  He had some cramping associated with bowel movements. In the emergency room hemodynamically stable.  Creatinine 1.68.  WC count 14.7.  Hemoglobin was 15.  Fecal occult was positive, however patient was already having visible blood.  CT angiogram bleeding scan was negative.  He does have a history of diverticulosis.  Patient is on Eliquis .  Admitted to the hospital due to significant symptoms.  Subjective: Patient seen and examined.  He kept tally of his bowel movements on the board, this was his seventh small-volume loose bloody stool since yesterday evening.  Patient tells me that the volume is going down but frequency has not improved.  Denies any nausea.  He gets a slight cramping but improves with bowel movement. Patient denies any recent antibiotic use or history of C. difficile. Patient denies any sick contacts. He is hungry.   Assessment & Plan:   E. coli enteritis, lower GI bleeding secondary to E. 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 coli positive.  Presumed symptomatic due to bloody diarrhea.  Azithromycin  500 mg daily for 5 days. C. difficile antigen positive, toxin is negative.  Less likely to have concomitant C. difficile and E. coli.  Will not treat. He does have bloody diarrhea, however he has remained stable, no angiographic evidence of active bleeding.  Hemoglobin has remained stable.  Anticipate conservative management. Will allow regular diet. GI to follow-up. Case discussed with GI and ID  pharmacy. Continue enteric precaution for E. coli.  Until symptoms improve.  AKI on CKD stage IIIa: Treated with IV fluids overnight.  Improved.  Continue maintenance fluid today due to ongoing diarrhea.  Hypokalemia: Replace and monitor.  Paroxysmal A-fib on Eliquis : Remains sinus rhythm.  he is off beta-blockers.  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 We can safely hold his Eliquis  until bleeding improves.  HOCM: Currently stable.  On mavacamten .  Continued.  Will allow regular diet.  Minimize IV fluids. Sleep apnea: Uses CPAP at night.  DVT prophylaxis: Place TED hose Start: 04/04/24 1508   Code Status: Full code Family Communication: None Disposition Plan: Status is: Observation The patient will require care spanning > 2 midnights and should be moved to inpatient because: Ongoing bloody diarrhea, monitoring, IV fluids     Consultants:  ID, curbside Gastroenterology  Procedures:  None  Antimicrobials:  Azithromycin  1/19--- Dificid  1/19--1/20     Objective: Vitals:   04/04/24 2341 04/05/24 0221 04/05/24 0651 04/05/24 0900  BP:  139/81 126/76 (!) 143/78  Pulse: 82 74 66 69  Resp: 20 18 18    Temp:  97.9 F (36.6 C) 98.1 F (36.7 C)   TempSrc:  Oral Oral   SpO2:  96% 98%   Weight:      Height:        Intake/Output Summary (Last 24 hours) at 04/05/2024 1047 Last data filed at 04/05/2024 0607 Gross per 24 hour  Intake 2988.4 ml  Output --  Net 2988.4 ml   Filed Weights   04/04/24  1641  Weight: 88.3 kg    Examination:  General exam: Appears calm and comfortable.  Pleasant to interaction. Respiratory system: Clear to auscultation. Respiratory effort normal. Cardiovascular system: S1 & S2 heard, RRR.  No pedal edema. Gastrointestinal system: Abdomen is nondistended, soft and nontender. No organomegaly or masses felt. Normal bowel sounds heard. Central nervous system: Alert and oriented.  No focal neurological deficits. Extremities: Symmetric 5 x 5 power. Skin: No rashes, lesions or ulcers Psychiatry: Judgement and insight appear normal. Mood & affect appropriate.     Data Reviewed: I have personally reviewed following labs and imaging studies  CBC: Recent Labs  Lab 04/04/24 0942 04/05/24 0610  WBC 14.7* 11.0*  HGB 15.2 13.4  HCT 44.3 40.0  MCV 85.9 86.0  PLT 214 180   Basic Metabolic Panel: Recent Labs  Lab 04/04/24 0942 04/05/24 0610  NA 140 139  K 4.2 3.4*  CL 102 106  CO2 25 24  GLUCOSE 110* 90  BUN 29* 17  CREATININE 1.68* 1.25*  CALCIUM  10.2 8.5*   GFR: Estimated Creatinine Clearance: 61.1 mL/min (A) (by C-G formula based on SCr of 1.25 mg/dL (H)). Liver Function Tests: Recent Labs  Lab 04/04/24 0942  AST 29  ALT 34  ALKPHOS 58  BILITOT 0.5  PROT 7.3  ALBUMIN 4.7   Recent Labs  Lab 04/04/24 0942  LIPASE 120*   No results for input(s): AMMONIA in the last 168 hours. Coagulation Profile: No results for input(s): INR, PROTIME in the last 168 hours. Cardiac Enzymes: No results for input(s): CKTOTAL, CKMB, CKMBINDEX, TROPONINI in the last 168 hours. BNP (last 3 results) No results for input(s): PROBNP in the last 8760 hours. HbA1C: No results for input(s): HGBA1C in the last 72 hours. CBG: No results for input(s): GLUCAP in the last 168 hours. Lipid Profile: No results for input(s): CHOL, HDL, LDLCALC, TRIG, CHOLHDL, LDLDIRECT in the last 72 hours. Thyroid Function Tests: No results for input(s): TSH, T4TOTAL, FREET4, T3FREE, THYROIDAB in the last 72 hours. Anemia Panel: No results for input(s): VITAMINB12, FOLATE, FERRITIN, TIBC, IRON, RETICCTPCT in the last 72 hours. Sepsis Labs: No results for input(s): PROCALCITON, LATICACIDVEN in the last 168 hours.  Recent Results (from the past 240 hours)  C Difficile Quick Screen w PCR reflex     Status: Abnormal   Collection  Time: 04/04/24 12:13 PM   Specimen: STOOL  Result Value Ref Range Status   C Diff antigen POSITIVE (A) NEGATIVE Final   C Diff toxin NEGATIVE NEGATIVE Final   C Diff interpretation Results are indeterminate. See PCR results.  Final    Comment: Performed at [facility], [address], [place], [place] [address]  Gastrointestinal Panel by PCR , Stool     Status: Abnormal   Collection Time: 04/04/24 12:13 PM   Specimen: Stool  Result Value Ref Range Status   Campylobacter species NOT DETECTED NOT DETECTED Final   Plesimonas shigelloides NOT DETECTED NOT DETECTED Final   Salmonella species NOT DETECTED NOT DETECTED Final   Yersinia enterocolitica NOT DETECTED NOT DETECTED Final   Vibrio species NOT DETECTED NOT DETECTED Final   Vibrio cholerae NOT DETECTED NOT DETECTED Final   Enteroaggregative E coli (EAEC) NOT DETECTED NOT DETECTED Final   Enteropathogenic E coli (EPEC) DETECTED (A) NOT DETECTED Final    Comment: RESULT CALLED TO, READ BACK BY AND VERIFIED WITH: HILLARY MASHBURN 04/04/24 1711 MU    Enterotoxigenic E coli (ETEC) NOT DETECTED NOT DETECTED Final  Shiga like toxin producing E coli (STEC) NOT DETECTED NOT DETECTED Final   Shigella/Enteroinvasive E coli (EIEC) NOT DETECTED NOT DETECTED Final   Cryptosporidium NOT DETECTED NOT DETECTED Final   Cyclospora cayetanensis NOT DETECTED NOT DETECTED Final   Entamoeba histolytica NOT DETECTED NOT DETECTED Final   Giardia lamblia NOT DETECTED NOT DETECTED Final   Adenovirus F40/41 NOT DETECTED NOT DETECTED Final   Astrovirus NOT DETECTED NOT DETECTED Final   Norovirus GI/GII NOT DETECTED NOT DETECTED Final   Rotavirus A NOT DETECTED NOT DETECTED Final   Sapovirus (I, II, IV, and V) NOT DETECTED NOT DETECTED Final    Comment: Performed at [facility], [address] [place], [place] [address]  C. Diff by PCR, Reflexed     Status: Abnormal   Collection Time: 04/04/24 12:13 PM  Result Value Ref Range Status    Toxigenic C. Difficile by PCR POSITIVE (A) NEGATIVE Final    Comment: Positive for toxigenic C. difficile with little to no toxin production. Only treat if clinical presentation suggests symptomatic illness.   Hypervirulent Strain PRESUMPTIVE NEGATIVE PRESUMPTIVE NEGATIVE Final    Comment: Performed at [facility], [address], [place], [place] [address]         Radiology Studies: CT ANGIO GI BLEED Result Date: 04/04/2024 EXAM: CTA ABDOMEN AND PELVIS WITH CONTRAST 04/04/2024 10:34:03 AM TECHNIQUE: CTA images of the abdomen and pelvis with intravenous contrast. Three-dimensional MIP/volume rendered formations were performed. Automated exposure control, iterative reconstruction, and/or weight based adjustment of the mA/kV was utilized to reduce the radiation dose to as low as reasonably achievable. COMPARISON: MRI of the abdomen dated 11/21/2021. CLINICAL HISTORY: GI bleed. FINDINGS: VASCULATURE: GI BLEED: There is no convincing evidence of gastrointestinal tract hemorrhage. AORTA: The abdominal aorta is normal in caliber and demonstrates mild-to-moderate calcific atheromatous disease. No abdominal aortic aneurysm. No dissection. CELIAC TRUNK: No acute finding. No occlusion or significant stenosis. SUPERIOR MESENTERIC ARTERY: No acute finding. No occlusion or significant stenosis. INFERIOR MESENTERIC ARTERY: No acute finding. No occlusion or significant stenosis. RENAL ARTERIES: No acute finding. No occlusion or significant stenosis. ILIAC ARTERIES: No acute finding. No occlusion or significant stenosis. ABDOMEN/PELVIS: LOWER CHEST: Visualized portion of the lower chest demonstrates no acute abnormality. LIVER: The liver is unremarkable. GALLBLADDER AND BILE DUCTS: Gallbladder is unremarkable. No biliary ductal dilatation. SPLEEN: The spleen is unremarkable. PANCREAS: The pancreas is unremarkable. ADRENAL GLANDS: Bilateral adrenal glands demonstrate no acute abnormality. KIDNEYS, URETERS AND  BLADDER: There is a nonobstructive calculus in the superior pole of the right kidney, measuring about 5 mm in diameter. There is also a simple right renal cyst present, measuring approximately 3.6 cm. No hydronephrosis. No perinephric or periureteral stranding. Urinary bladder is unremarkable. GI AND BOWEL: The stomach and small bowel are unremarkable. There is diffuse abnormal thickening of the wall of the sigmoid colon and mid to distal descending colon. There are several scattered colonic diverticula also present. There is no bowel obstruction. No abnormal bowel wall thickening or distension. REPRODUCTIVE: Reproductive organs are unremarkable. PERITONEUM AND RETROPERITONEUM: There are bilateral fat-containing inguinal hernias present, left greater than right. No ascites or free air. LYMPH NODES: No lymphadenopathy. BONES AND SOFT TISSUES: No acute abnormality of the bones. No acute soft tissue abnormality. IMPRESSION: 1. No convincing evidence of gastrointestinal tract hemorrhage. 2. Diffuse abnormal thickening of the wall of the sigmoid colon and mid to distal descending colon with several scattered colonic diverticula. The findings are compatible with colitis. 3. Nonobstructive calculus  in the superior pole of the right kidney, measuring about 5 mm in diameter. 4. Simple right renal cyst measuring approximately 3.6 cm. No follow-up imaging is recommended. 5. Bilateral fat-containing inguinal hernias, left greater than right. Electronically signed by: Timothy Berrigan MD 04/04/2024 11:47 AM EST RP Workstation: [id number]        Scheduled Meds:  amLODipine   10 mg Oral Daily   azithromycin   500 mg Oral QHS   gabapentin   300 mg Oral QHS   mavacamten   10 mg Oral QHS   metoprolol  succinate  25 mg Oral Daily   omega-3 acid ethyl esters  2 g Oral BID   potassium chloride   20 mEq Oral BID   rosuvastatin   20 mg Oral Daily   Continuous Infusions:  sodium chloride  100 mL/hr at 04/05/24 1003     LOS: 0  days      Renato Applebaum, MD [facility]   "

## 2024-04-05 NOTE — Consult Note (Signed)
 [facility] Gastroenterology Consult  Referring Provider: No ref. provider found Primary Care Physician:  Yolande Toribio MATSU, MD Primary Gastroenterologist: [facility] - Dr. Donnald  Reason for Consultation: rectal bleeding   SUBJECTIVE:   HPI: Alan Spencer is a 69 y.o. male with past medical history significant for hypertrophic obstructive cardiomyopathy, atrial fibrillation on Eliquis , PFO closure 2024, hyperparathyroidism s/p parathyroidectomy. Presented to hospital with chief complaint of relatively sudden onset diarrhea, rectal bleeding.  Began to have diarrhea few days prior to presentation, began to later have large-volume rectal bleeding.  Did have some bilateral lower quadrant abdominal cramping prior to diarrheal episodes.  No chest pain or shortness of breath.  No nausea or vomiting.  Noted that he uses NSAIDs roughly twice per week prior to playing tennis.  Works as a cabin crew.  No recent antibiotic use.  Labs hemoglobin 15.2 (now 13.4), WBC 14.7, platelet 214, BUN/creatinine 29/1.68.  C. difficile antigen positive, C. difficile PCR positive, C. difficile toxin negative, he was initially started on Dificid  though this was subsequently discontinued as his symptoms did not present in a typical fashion of C. difficile colitis.  CT scan of abdomen pelvis was completed and showed abnormal thickening of the sigmoid and descending colon, scattered diverticular disease.  Colonoscopy 10/26/2020 (Dr. Donnald) showed diminutive rectal hyperplastic polyps.  Past Medical History:  Diagnosis Date   Anxiety    Essential hypertension, benign    Excessive daytime sleepiness 07/03/2015   Family history of colon cancer    HNP (herniated nucleus pulposus), lumbar    L4-L5   Mixed hyperlipidemia    Obesity (BMI 30-39.9) 01/10/2018   Obstructive hypertrophic cardiomyopathy (HCC) 11/07/2021   Rectal bleeding    Resistant hypertension    Essential hypertension   Sleep apnea    uses CPAP    Past Surgical History:  Procedure Laterality Date   ATRIAL SEPTAL DEFECT(ASD) CLOSURE N/A 07/24/2022   Procedure: ATRIAL SEPTAL DEFECT(ASD) CLOSURE;  Surgeon: Wonda Sharper, MD;  Location: [facility] INVASIVE CV LAB;  Service: Cardiovascular;  Laterality: N/A;   broken left elbow     NASAL SEPTUM SURGERY  2022   PARATHYROIDECTOMY N/A 12/27/2020   Procedure: NECK EXPLORATION WITH BIOPSY OF PARATHYROID  GLAND x3;  Surgeon: Eletha Boas, MD;  Location: [facility] ORS;  Service: General;  Laterality: N/A;   TEE WITHOUT CARDIOVERSION N/A 06/13/2022   Procedure: TRANSESOPHAGEAL ECHOCARDIOGRAM (TEE);  Surgeon: Santo Stanly LABOR, MD;  Location: [facility] ENDOSCOPY;  Service: Cardiovascular;  Laterality: N/A;   TONSILLECTOMY     Prior to Admission medications  Medication Sig Start Date End Date Taking? Authorizing Provider  ALEVE 220 MG tablet Take 220-440 mg by mouth 2 (two) times daily as needed (for pain or headaches).   Yes [provider]  amLODipine  (NORVASC ) 10 MG tablet Take 1 tablet (10 mg total) by mouth daily. Patient taking differently: Take 10 mg by mouth at bedtime. 04/20/23  Yes   amoxicillin  (AMOXIL ) 500 MG capsule Take 4 capsules (2000 mg) by mouth 1 hour prior to dental work, including cleanings. 04/28/23  Yes Chandrasekhar, Mahesh A, MD  apixaban  (ELIQUIS ) 5 MG TABS tablet Take 1 tablet (5 mg total) by mouth 2 (two) times daily. 07/27/23  Yes   BIOTIN PO Take 1 tablet by mouth See admin instructions. Chew 1 tablet by mouth once a day   Yes [provider]  Cholecalciferol  (VITAMIN D3) 125 MCG (5000 UT) CAPS Take 1 capsule (5,000 Units total) by mouth 2 (two) times a week. Patient  taking differently: Take 5,000 Units by mouth See admin instructions. Take 5,000 units by mouth on Tuesdays and Thursdays 04/13/23  Yes   Coenzyme Q10 (COQ10) 100 MG CAPS Take 100 mg by mouth in the morning.   Yes [provider]  fluticasone  (FLONASE ) 50 MCG/ACT nasal spray Place 1 spray into both  nostrils daily as needed for allergies. 08/11/17  Yes [provider]  gabapentin  (NEURONTIN ) 300 MG capsule Take 1 capsule (300 mg total) by mouth daily. Patient taking differently: Take 300 mg by mouth at bedtime. 02/23/24  Yes   glucosamine-chondroitin 500-400 MG tablet Take 1 tablet by mouth 2 (two) times a week.   Yes [provider]  hydrALAZINE  (APRESOLINE ) 25 MG tablet Take 1 tablet (25 mg total) by mouth 3 (three) times daily as needed (for blood pressure above 160). 02/05/22  Yes Raford Riggs, MD  hydrochlorothiazide  (MICROZIDE ) 12.5 MG capsule Take 1 capsule (12.5 mg total) by mouth daily. Patient taking differently: Take 12.5 mg by mouth at bedtime. 12/04/23  Yes Chandrasekhar, Mahesh A, MD  hydrocortisone  2.5 % cream Apply 1 Application to affected area topically at bedtime. Patient taking differently: Apply 1 Application topically at bedtime as needed (for hemorrhoids). 01/12/24  Yes   irbesartan -hydrochlorothiazide  (AVALIDE) 300-12.5 MG tablet Take 1 tablet by mouth daily. Patient taking differently: Take 1 tablet by mouth in the morning. 06/01/23  Yes   mavacamten  (CAMZYOS ) 10 MG CAPS capsule TAKE 1 CAPSULE BY MOUTH DAILY Patient taking differently: Take 10 mg by mouth at bedtime. 03/11/24  Yes Chandrasekhar, Mahesh A, MD  metoprolol  succinate (TOPROL  XL) 25 MG 24 hr tablet Take 1 tablet (25 mg total) by mouth daily. 05/07/23  Yes Chandrasekhar, Mahesh A, MD  olopatadine  (PATADAY ) 0.1 % ophthalmic solution Place 1 drop into both eyes daily as needed for allergies.   Yes [provider]  omega-3 acid ethyl esters (LOVAZA ) 1 g capsule Take 2 g by mouth 2 (two) times daily.   Yes [provider]  PRESCRIPTION MEDICATION CPAP- At bedtime   Yes [provider]  rosuvastatin  (CRESTOR ) 20 MG tablet Take 1 tablet (20 mg total) by mouth daily. Patient taking differently: Take 20 mg by mouth at bedtime. 07/20/23  Yes   sildenafil (VIAGRA) 50 MG  tablet Take 50 mg by mouth daily as needed for erectile dysfunction. 12/28/22  Yes [provider]  spironolactone  (ALDACTONE ) 25 MG tablet Take 1 tablet (25 mg total) by mouth daily. 01/11/24  Yes Court Dorn PARAS, MD  traMADol  (ULTRAM ) 50 MG tablet Take 1 tablet (50 mg total) by mouth 2 (two) times daily as needed for moderate pain. 07/31/23  Yes   gabapentin  (NEURONTIN ) 300 MG capsule Take 1 capsule (300 mg total) by mouth at bedtime. Patient not taking: Reported on 04/04/2024 10/30/23   Cosentino, Allison R, PA-C  meloxicam  (MOBIC ) 15 MG tablet Take 1 tablet (15 mg total) by mouth daily as needed for pain. Patient not taking: Reported on 04/04/2024 08/04/23     neomycin -polymyxin-dexameth (MAXITROL ) 0.1 % OINT Apply 1/4 inch to both eyes at bedtime for 1 week. Patient not taking: Reported on 04/04/2024 05/01/23     tirzepatide  (ZEPBOUND ) 2.5 MG/0.5ML Pen Inject 2.5 mg into the skin every 7 (seven) days. Patient not taking: Reported on 04/04/2024 04/08/23     tirzepatide  (ZEPBOUND ) 2.5 MG/0.5ML Pen Inject 2.5 mg into the skin once a week. Patient not taking: Reported on 04/04/2024 01/12/24     tirzepatide  (ZEPBOUND ) 5 MG/0.5ML Pen  Inject 5 mg into the skin every 7 (seven) days. Patient not taking: Reported on 04/04/2024 04/08/23      Current Facility-Administered Medications  Medication Dose Route Frequency Provider Last Rate Last Admin   0.9 %  sodium chloride  infusion   Intravenous Continuous Ghimire, Kuber, MD 75 mL/hr at 04/05/24 1138 Rate Change at 04/05/24 1138   acetaminophen  (TYLENOL ) tablet 650 mg  650 mg Oral Q6H PRN Cox, Amy N, DO       Or   acetaminophen  (TYLENOL ) suppository 650 mg  650 mg Rectal Q6H PRN Cox, Amy N, DO       amLODipine  (NORVASC ) tablet 10 mg  10 mg Oral Daily Cox, Amy N, DO   10 mg at 04/04/24 1753   azithromycin  (ZITHROMAX ) tablet 500 mg  500 mg Oral QHS Cox, Amy N, DO   500 mg at 04/04/24 2045   gabapentin  (NEURONTIN ) capsule 300 mg  300 mg Oral QHS Cox, Amy  N, DO   300 mg at 04/04/24 2046   hydrALAZINE  (APRESOLINE ) injection 5 mg  5 mg Intravenous Q6H PRN Cox, Amy N, DO       loperamide  (IMODIUM ) capsule 4 mg  4 mg Oral PRN Barrett, Jamie N, PA-C   4 mg at 04/04/24 1035   mavacamten  (CAMZYOS ) capsule 10 mg  10 mg Oral QHS Cox, Amy N, DO       metoprolol  succinate (TOPROL -XL) 24 hr tablet 25 mg  25 mg Oral Daily Cox, Amy N, DO   25 mg at 04/05/24 0900   omega-3 acid ethyl esters (LOVAZA ) capsule 2 g  2 g Oral BID Cox, Amy N, DO   2 g at 04/05/24 0900   ondansetron  (ZOFRAN ) tablet 4 mg  4 mg Oral Q6H PRN Cox, Amy N, DO       Or   ondansetron  (ZOFRAN ) injection 4 mg  4 mg Intravenous Q6H PRN Cox, Amy N, DO       Oral care mouth rinse  15 mL Mouth Rinse PRN Cox, Amy N, DO       potassium chloride  SA (KLOR-CON  M) CR tablet 20 mEq  20 mEq Oral BID Ghimire, Kuber, MD   20 mEq at 04/05/24 1138   rosuvastatin  (CRESTOR ) tablet 20 mg  20 mg Oral Daily Cox, Amy N, DO   20 mg at 04/04/24 1752   traZODone  (DESYREL ) tablet 50-100 mg  50-100 mg Oral QHS PRN Cox, Amy N, DO   50 mg at 04/04/24 2151   Allergies as of 04/04/2024   (No Known Allergies)   Family History  Problem Relation Age of Onset   Hypertension Mother    Hypertrophic cardiomyopathy Mother    Kidney failure Father    Colon cancer Paternal Grandmother    Social History   Socioeconomic History   Marital status: Married    Spouse name: Not on file   Number of children: Not on file   Years of education: Not on file   Highest education level: Not on file  Occupational History   Not on file  Tobacco Use   Smoking status: Never   Smokeless tobacco: Never  Vaping Use   Vaping status: Never Used  Substance and Sexual Activity   Alcohol use: Yes    Comment: occas.   Drug use: No   Sexual activity: Not on file  Other Topics Concern   Not on file  Social History Narrative   Not on file   Social Drivers of Health  Tobacco Use: Low Risk (04/04/2024)   Patient History    Smoking  Tobacco Use: Never    Smokeless Tobacco Use: Never    Passive Exposure: Not on file  Financial Resource Strain: Low Risk (11/07/2021)   Overall Financial Resource Strain (CARDIA)    Difficulty of Paying Living Expenses: Not hard at all  Food Insecurity: No Food Insecurity (04/04/2024)   Epic    Worried About Programme Researcher, Broadcasting/film/video in the Last Year: Never true    Ran Out of Food in the Last Year: Never true  Transportation Needs: No Transportation Needs (04/04/2024)   Epic    Lack of Transportation (Medical): No    Lack of Transportation (Non-Medical): No  Physical Activity: Sufficiently Active (11/07/2021)   Exercise Vital Sign    Days of Exercise per Week: 7 days    Minutes of Exercise per Session: 70 min  Recent Concern: Physical Activity - Insufficiently Active (11/07/2021)   Exercise Vital Sign    Days of Exercise per Week: 4 days    Minutes of Exercise per Session: 30 min  Stress: Not on file  Social Connections: Moderately Integrated (04/04/2024)   Social Connection and Isolation Panel    Frequency of Communication with Friends and Family: More than three times a week    Frequency of Social Gatherings with Friends and Family: Twice a week    Attends Religious Services: Never    Database Administrator or Organizations: Yes    Attends Engineer, Structural: More than 4 times per year    Marital Status: Married  Catering Manager Violence: Not At Risk (04/04/2024)   Epic    Fear of Current or Ex-Partner: No    Emotionally Abused: No    Physically Abused: No    Sexually Abused: No  Depression (PHQ2-9): Not on file  Alcohol Screen: Low Risk (11/07/2021)   Alcohol Screen    Last Alcohol Screening Score (AUDIT): 2  Housing: Low Risk (04/04/2024)   Epic    Unable to Pay for Housing in the Last Year: No    Number of Times Moved in the Last Year: 0    Homeless in the Last Year: No  Utilities: Not At Risk (04/04/2024)   Epic    Threatened with loss of utilities: No  Health  Literacy: Not on file   Review of Systems:  Review of Systems  Respiratory:  Negative for shortness of breath.   Cardiovascular:  Negative for chest pain.  Gastrointestinal:  Positive for abdominal pain, blood in stool and diarrhea. Negative for constipation, nausea and vomiting.    OBJECTIVE:   Temp:  [97.9 F (36.6 C)-98.2 F (36.8 C)] 98.1 F (36.7 C) (01/20 0651) Pulse Rate:  [66-94] 69 (01/20 0900) Resp:  [18-20] 18 (01/20 0651) BP: (126-157)/(76-98) 143/78 (01/20 0900) SpO2:  [96 %-100 %] 98 % (01/20 0651) FiO2 (%):  [21 %] 21 % (01/19 2341) Weight:  [88.3 kg] 88.3 kg (01/19 1641) Last BM Date : 04/04/24 Physical Exam Constitutional:      General: He is not in acute distress.    Appearance: He is not ill-appearing, toxic-appearing or diaphoretic.  Cardiovascular:     Rate and Rhythm: Normal rate and regular rhythm.  Pulmonary:     Effort: No respiratory distress.     Breath sounds: Normal breath sounds.  Abdominal:     General: Bowel sounds are normal. There is no distension.     Palpations: Abdomen is soft.  Tenderness: There is no abdominal tenderness. There is no guarding.     Hernia: A hernia (Umbilical) is present.  Skin:    General: Skin is warm and dry.  Neurological:     Mental Status: He is alert.     Labs: Recent Labs    04/04/24 0942 04/05/24 0610  WBC 14.7* 11.0*  HGB 15.2 13.4  HCT 44.3 40.0  PLT 214 180   BMET Recent Labs    04/04/24 0942 04/05/24 0610  NA 140 139  K 4.2 3.4*  CL 102 106  CO2 25 24  GLUCOSE 110* 90  BUN 29* 17  CREATININE 1.68* 1.25*  CALCIUM  10.2 8.5*   LFT Recent Labs    04/04/24 0942  PROT 7.3  ALBUMIN 4.7  AST 29  ALT 34  ALKPHOS 58  BILITOT 0.5   PT/INR No results for input(s): LABPROT, INR in the last 72 hours.  Diagnostic imaging: CT ANGIO GI BLEED Result Date: 04/04/2024 EXAM: CTA ABDOMEN AND PELVIS WITH CONTRAST 04/04/2024 10:34:03 AM TECHNIQUE: CTA images of the abdomen and pelvis  with intravenous contrast. Three-dimensional MIP/volume rendered formations were performed. Automated exposure control, iterative reconstruction, and/or weight based adjustment of the mA/kV was utilized to reduce the radiation dose to as low as reasonably achievable. COMPARISON: MRI of the abdomen dated 11/21/2021. CLINICAL HISTORY: GI bleed. FINDINGS: VASCULATURE: GI BLEED: There is no convincing evidence of gastrointestinal tract hemorrhage. AORTA: The abdominal aorta is normal in caliber and demonstrates mild-to-moderate calcific atheromatous disease. No abdominal aortic aneurysm. No dissection. CELIAC TRUNK: No acute finding. No occlusion or significant stenosis. SUPERIOR MESENTERIC ARTERY: No acute finding. No occlusion or significant stenosis. INFERIOR MESENTERIC ARTERY: No acute finding. No occlusion or significant stenosis. RENAL ARTERIES: No acute finding. No occlusion or significant stenosis. ILIAC ARTERIES: No acute finding. No occlusion or significant stenosis. ABDOMEN/PELVIS: LOWER CHEST: Visualized portion of the lower chest demonstrates no acute abnormality. LIVER: The liver is unremarkable. GALLBLADDER AND BILE DUCTS: Gallbladder is unremarkable. No biliary ductal dilatation. SPLEEN: The spleen is unremarkable. PANCREAS: The pancreas is unremarkable. ADRENAL GLANDS: Bilateral adrenal glands demonstrate no acute abnormality. KIDNEYS, URETERS AND BLADDER: There is a nonobstructive calculus in the superior pole of the right kidney, measuring about 5 mm in diameter. There is also a simple right renal cyst present, measuring approximately 3.6 cm. No hydronephrosis. No perinephric or periureteral stranding. Urinary bladder is unremarkable. GI AND BOWEL: The stomach and small bowel are unremarkable. There is diffuse abnormal thickening of the wall of the sigmoid colon and mid to distal descending colon. There are several scattered colonic diverticula also present. There is no bowel obstruction. No abnormal  bowel wall thickening or distension. REPRODUCTIVE: Reproductive organs are unremarkable. PERITONEUM AND RETROPERITONEUM: There are bilateral fat-containing inguinal hernias present, left greater than right. No ascites or free air. LYMPH NODES: No lymphadenopathy. BONES AND SOFT TISSUES: No acute abnormality of the bones. No acute soft tissue abnormality. IMPRESSION: 1. No convincing evidence of gastrointestinal tract hemorrhage. 2. Diffuse abnormal thickening of the wall of the sigmoid colon and mid to distal descending colon with several scattered colonic diverticula. The findings are compatible with colitis. 3. Nonobstructive calculus in the superior pole of the right kidney, measuring about 5 mm in diameter. 4. Simple right renal cyst measuring approximately 3.6 cm. No follow-up imaging is recommended. 5. Bilateral fat-containing inguinal hernias, left greater than right. Electronically signed by: Evalene Coho MD 04/04/2024 11:47 AM EST RP Workstation: [id number]   IMPRESSION: Hematochezia, suspect related  to diverticular bleed Abnormal CT imaging Enteropathogenic E. coli C. difficile antigen and PCR positive, C. difficile toxin negative Personal history hyperplastic polyps Atrial fibrillation on Eliquis  Hypertrophic obstructive cardiomyopathy  PLAN: - Patient noted last bowel movement this a.m. 0930 with small amount of blood - As patient's symptoms do not appear consistent with C. difficile colitis, Dificid  has been discontinued - Patient is currently on oral azithromycin  for enteropathogenic E. Coli - Discussed clinical nature of diverticular bleeding with patient, will monitor his bowel movements and continue to hold Eliquis  - Okay for clear liquid diet, can advance to soft diet as patient tolerates - Trend blood count in a.m. - No plan for inpatient colonoscopy unless significant rectal bleeding were to occur, can arrange for outpatient colonoscopy in roughly 2 months - [facility] will  follow   LOS: 0 days   Estefana Keas, DO [facility] Gastroenterology

## 2024-04-05 NOTE — Telephone Encounter (Signed)
 Pharmacy Patient Advocate Encounter  Insurance verification completed.    The patient is insured through [employer]/ [employer]. Patient has Medicare and is not eligible for a copay card, but may be able to apply for patient assistance or Medicare RX Payment Plan (Patient Must reach out to their plan, if eligible for payment plan), if available.    Ran test claim for Generic Dificid  200mg  tablet and the current 10 day co-pay is $1,314.47 due to deductible.  Ran test claim for Vancomycin 125mg  tablet and the current 10 day co-pay is $86.94 due to deductible.  This test claim was processed through [facility]- copay amounts may vary at other pharmacies due to pharmacy/plan contracts, or as the patient moves through the different stages of their insurance plan.

## 2024-04-06 ENCOUNTER — Other Ambulatory Visit (HOSPITAL_COMMUNITY): Payer: Self-pay

## 2024-04-06 DIAGNOSIS — I480 Paroxysmal atrial fibrillation: Secondary | ICD-10-CM | POA: Insufficient documentation

## 2024-04-06 DIAGNOSIS — D62 Acute posthemorrhagic anemia: Secondary | ICD-10-CM | POA: Insufficient documentation

## 2024-04-06 DIAGNOSIS — N179 Acute kidney failure, unspecified: Secondary | ICD-10-CM | POA: Insufficient documentation

## 2024-04-06 LAB — BASIC METABOLIC PANEL WITH GFR
Anion gap: 10 (ref 5–15)
BUN: 18 mg/dL (ref 8–23)
CO2: 23 mmol/L (ref 22–32)
Calcium: 8.4 mg/dL — ABNORMAL LOW (ref 8.9–10.3)
Chloride: 111 mmol/L (ref 98–111)
Creatinine, Ser: 1.24 mg/dL (ref 0.61–1.24)
GFR, Estimated: >60 mL/min
Glucose, Bld: 126 mg/dL — ABNORMAL HIGH (ref 70–99)
Potassium: 3.4 mmol/L — ABNORMAL LOW (ref 3.5–5.1)
Sodium: 144 mmol/L (ref 135–145)

## 2024-04-06 LAB — CBC WITH DIFFERENTIAL/PLATELET
Abs Immature Granulocytes: 0.04 K/uL (ref 0.00–0.07)
Basophils Absolute: 0 K/uL (ref 0.0–0.1)
Basophils Relative: 0 %
Eosinophils Absolute: 0.1 K/uL (ref 0.0–0.5)
Eosinophils Relative: 1 %
HCT: 36.7 % — ABNORMAL LOW (ref 39.0–52.0)
Hemoglobin: 12.1 g/dL — ABNORMAL LOW (ref 13.0–17.0)
Immature Granulocytes: 0 %
Lymphocytes Relative: 15 %
Lymphs Abs: 1.4 K/uL (ref 0.7–4.0)
MCH: 28.8 pg (ref 26.0–34.0)
MCHC: 33 g/dL (ref 30.0–36.0)
MCV: 87.4 fL (ref 80.0–100.0)
Monocytes Absolute: 0.6 K/uL (ref 0.1–1.0)
Monocytes Relative: 6 %
Neutro Abs: 7.1 K/uL (ref 1.7–7.7)
Neutrophils Relative %: 78 %
Platelets: 163 K/uL (ref 150–400)
RBC: 4.2 MIL/uL — ABNORMAL LOW (ref 4.22–5.81)
RDW: 13.2 % (ref 11.5–15.5)
WBC: 9.3 K/uL (ref 4.0–10.5)
nRBC: 0 % (ref 0.0–0.2)

## 2024-04-06 LAB — MAGNESIUM: Magnesium: 1.5 mg/dL — ABNORMAL LOW (ref 1.7–2.4)

## 2024-04-06 LAB — PHOSPHORUS: Phosphorus: 3.9 mg/dL (ref 2.5–4.6)

## 2024-04-06 MED ORDER — POTASSIUM CHLORIDE CRYS ER 20 MEQ PO TBCR
40.0000 meq | EXTENDED_RELEASE_TABLET | Freq: Once | ORAL | Status: AC
  Administered 2024-04-06: 40 meq via ORAL
  Filled 2024-04-06: qty 2

## 2024-04-06 MED ORDER — AZITHROMYCIN 500 MG PO TABS
ORAL_TABLET | ORAL | 0 refills | Status: AC
  Filled 2024-04-06: qty 3, 3d supply, fill #0

## 2024-04-06 MED ORDER — MAGNESIUM SULFATE 2 GM/50ML IV SOLN
2.0000 g | Freq: Once | INTRAVENOUS | Status: AC
  Administered 2024-04-06: 2 g via INTRAVENOUS
  Filled 2024-04-06: qty 50

## 2024-04-06 NOTE — Care Management Obs Status (Signed)
 MEDICARE OBSERVATION STATUS NOTIFICATION   Patient Details  Name: BARACK NICODEMUS MRN: [id number] Date of Birth: 09/19/1955   Medicare Observation Status Notification Given:  Yes    Tawni CHRISTELLA Eva, LCSW 04/06/2024, 9:48 AM

## 2024-04-06 NOTE — Progress Notes (Signed)
" °   04/05/24 2350  BiPAP/CPAP/SIPAP  BiPAP/CPAP/SIPAP Pt Type Adult  BiPAP/CPAP/SIPAP Resmed  Mask Type Nasal mask  Dentures removed? Not applicable  Mask Size Large  FiO2 (%) 21 %  Patient Home Machine No  Patient Home Mask No  Patient Home Tubing No  Auto Titrate Yes  Minimum cmH2O 5 cmH2O  Maximum cmH2O 20 cmH2O  Device Plugged into [employer] Power Outlet Yes    "

## 2024-04-06 NOTE — Progress Notes (Signed)
 [facility] Progress Note  SUBJECTIVE:   Interval history: Alan Spencer was seen and evaluated today at bedside prior to discharge.  Noted feeling relatively well.  He had a soft nonbloody bowel movement this a.m.  Tolerating diet.  No nausea or vomiting.  Has some bilateral lower abdominal discomfort.  No chest pain or shortness of breath.  Past Medical History:  Diagnosis Date   Anxiety    Essential hypertension, benign    Excessive daytime sleepiness 07/03/2015   Family history of colon cancer    HNP (herniated nucleus pulposus), lumbar    L4-L5   Mixed hyperlipidemia    Obesity (BMI 30-39.9) 01/10/2018   Obstructive hypertrophic cardiomyopathy (HCC) 11/07/2021   Rectal bleeding    Resistant hypertension    Essential hypertension   Sleep apnea    uses CPAP   Past Surgical History:  Procedure Laterality Date   ATRIAL SEPTAL DEFECT(ASD) CLOSURE N/A 07/24/2022   Procedure: ATRIAL SEPTAL DEFECT(ASD) CLOSURE;  Surgeon: Wonda Sharper, MD;  Location: [facility];  Service: Cardiovascular;  Laterality: N/A;   broken left elbow     NASAL SEPTUM SURGERY  2022   PARATHYROIDECTOMY N/A 12/27/2020   Procedure: NECK EXPLORATION WITH BIOPSY OF PARATHYROID  GLAND x3;  Surgeon: Eletha Boas, MD;  Location: [facility];  Service: General;  Laterality: N/A;   TEE WITHOUT CARDIOVERSION N/A 06/13/2022   Procedure: TRANSESOPHAGEAL ECHOCARDIOGRAM (TEE);  Surgeon: Santo Stanly LABOR, MD;  Location: [facility]OS[facility];  Service: Cardiovascular;  Laterality: N/A;   TONSILLECTOMY     Current Facility-Administered Medications  Medication Dose Route Frequency Provider Last Rate Last Admin   acetaminophen  (TYLENOL ) tablet 650 mg  650 mg Oral Q6H PRN Cox, Amy N, DO   650 mg at 04/05/24 2113   Or   acetaminophen  (TYLENOL ) suppository 650 mg  650 mg Rectal Q6H PRN Cox, Amy N, DO       amLODipine  (NORVASC ) tablet 10 mg  10 mg Oral Daily Cox, Amy N, DO   10 mg at 04/05/24 2113   azithromycin   (ZITHROMAX ) tablet 500 mg  500 mg Oral QHS Cox, Amy N, DO   500 mg at 04/05/24 2112   gabapentin  (NEURONTIN ) capsule 300 mg  300 mg Oral QHS Cox, Amy N, DO   300 mg at 04/05/24 2113   hydrALAZINE  (APRESOLINE ) injection 5 mg  5 mg Intravenous Q6H PRN Cox, Amy N, DO       loperamide  (IMODIUM ) capsule 4 mg  4 mg Oral PRN Barrett, Jamie N, PA-C   4 mg at 04/04/24 1035   mavacamten  (CAMZYOS ) capsule 10 mg  10 mg Oral QHS Cox, Amy N, DO   10 mg at 04/05/24 2116   metoprolol  succinate (TOPROL -XL) 24 hr tablet 25 mg  25 mg Oral Daily Cox, Amy N, DO   25 mg at 04/06/24 9075   omega-3 acid ethyl esters (LOVAZA ) capsule 2 g  2 g Oral BID Cox, Amy N, DO   2 g at 04/06/24 9075   ondansetron  (ZOFRAN ) tablet 4 mg  4 mg Oral Q6H PRN Cox, Amy N, DO       Or   ondansetron  (ZOFRAN ) injection 4 mg  4 mg Intravenous Q6H PRN Cox, Amy N, DO       Oral care mouth rinse  15 mL Mouth Rinse PRN Cox, Amy N, DO       rosuvastatin  (CRESTOR ) tablet 20 mg  20 mg Oral Daily Cox, Amy N, DO   20  mg at 04/05/24 2113   traZODone  (DESYREL ) tablet 50-100 mg  50-100 mg Oral QHS PRN Cox, Amy N, DO   100 mg at 04/05/24 2112   Allergies as of 04/04/2024   (No Known Allergies)   Review of Systems:  Review of Systems  Respiratory:  Negative for shortness of breath.   Cardiovascular:  Negative for chest pain.  Gastrointestinal:  Positive for abdominal pain. Negative for blood in stool, nausea and vomiting.    OBJECTIVE:   Temp:  [97.9 F (36.6 C)-100.1 F (37.8 C)] 97.9 F (36.6 C) (01/21 0615) Pulse Rate:  [65-83] 65 (01/21 0615) Resp:  [18-20] 20 (01/21 0615) BP: (124-142)/(73-75) 124/73 (01/21 0615) SpO2:  [97 %-99 %] 97 % (01/21 0615) FiO2 (%):  [21 %] 21 % (01/20 2350) Last BM Date : 04/05/24 Physical Exam Constitutional:      General: He is not in acute distress.    Appearance: He is not ill-appearing, toxic-appearing or diaphoretic.  Cardiovascular:     Comments: Regular pulse Pulmonary:     Effort: No  respiratory distress.  Abdominal:     General: There is no distension.     Palpations: Abdomen is soft.     Tenderness: There is no abdominal tenderness. There is no guarding.  Neurological:     Mental Status: He is alert.     Labs: Recent Labs    04/04/24 0942 04/05/24 0610 04/06/24 0418  WBC 14.7* 11.0* 9.3  HGB 15.2 13.4 12.1*  HCT 44.3 40.0 36.7*  PLT 214 180 163   BMET Recent Labs    04/04/24 0942 04/05/24 0610 04/06/24 0418  NA 140 139 144  K 4.2 3.4* 3.4*  CL 102 106 111  CO2 25 24 23   GLUCOSE 110* 90 126*  BUN 29* 17 18  CREATININE 1.68* 1.25* 1.24  CALCIUM  10.2 8.5* 8.4*   LFT Recent Labs    04/04/24 0942  PROT 7.3  ALBUMIN 4.7  AST 29  ALT 34  ALKPHOS 58  BILITOT 0.5   PT/INR No results for input(s): LABPROT, INR in the last 72 hours. Diagnostic imaging: No results found.  IMPRESSION: Hematochezia, suspect related to diverticular bleed, resolved  Abnormal CT imaging Enteropathogenic E. coli C. difficile antigen and PCR positive, C. difficile toxin negative Personal history hyperplastic polyps Atrial fibrillation on Eliquis  Hypertrophic obstructive cardiomyopathy  PLAN: -Rectal bleeding is slowly stopping, hemoglobin is stable, tolerating diet -Treating for enteropathogenic E. coli -Abdominal pain is manageable -Reasonable to discharge patient to home with continued antibiotic therapy, outpatient follow-up with gastroenterology to monitor rectal bleeding and arrange for outpatient colonoscopy in roughly 2 months -Discussed plan with IM, plan for discharge   LOS: 0 days   Estefana Keas, [facility]

## 2024-04-06 NOTE — Progress Notes (Signed)
 Discharge medications delivered to patient at the bedside in a secure bag.

## 2024-04-06 NOTE — Discharge Summary (Signed)
 Physician Discharge Summary  Alan Spencer FMW:[id number] DOB: Apr 27, 1955 DOA: 04/04/2024  PCP: Yolande Toribio MATSU, MD  Admit date: 04/04/2024 Discharge date: 04/06/2024  Admitted From: Home Disposition: Home  Recommendations for Outpatient Follow-up:  Follow up with PCP in 1 week, repeat CBC as outpatient  Follow-up with GI, Dr. Kriss in 4 to 6 weeks  Discharge Condition: Stable CODE STATUS: Full code Diet recommendation: Soft diet  Brief/Interim Summary: Alan Spencer is a 69 year old with history of CKD stage IIIa with recent known creatinine of 1.4, hypertension, hyperlipidemia, sleep apnea, HOCM, paroxysmal A-fib on Eliquis  presented to the ER with sudden onset multiple episodes of bright red blood per rectum as well as bloody stool.  He had some cramping associated with bowel movements.  In the emergency room hemodynamically stable.  Creatinine 1.68.  WC count 14.7.  Hemoglobin was 15.  Fecal occult was positive, however patient was already having visible blood.  CT angiogram bleeding scan was negative.  He does have a history of diverticulosis.  Patient is on Eliquis .  Admitted to the hospital due to significant symptoms.  GI was consulted.  Stool study was positive for E. coli.  C. difficile antigen was positive, but did not feel this was clinically significant.  Patient was started on azithromycin  for E. coli.  GI was consulted.  Patient had clinical improvement in his symptoms.  Vitals remained stable.    Discharge Diagnoses:    CKD ruled out   Discharge Instructions  Discharge Instructions     Call MD for:   Complete by: As directed    GI bleeding   Call MD for:  difficulty breathing, headache or visual disturbances   Complete by: As directed    Call MD for:  extreme fatigue   Complete by: As directed    Call MD for:  persistant dizziness or light-headedness   Complete by: As directed    Call MD for:  persistant nausea and vomiting   Complete by: As directed     Call MD for:  severe uncontrolled pain   Complete by: As directed    Call MD for:  temperature >100.4   Complete by: As directed    Discharge instructions   Complete by: As directed    You were cared for by a hospitalist during your hospital stay. If you have any questions about your discharge medications or the care you received while you were in the hospital after you are discharged, you can call the unit and ask to speak with the hospitalist on call if the hospitalist that took care of you is not available. Once you are discharged, your primary care physician will handle any further medical issues. Please note that NO REFILLS for any discharge medications will be authorized once you are discharged, as it is imperative that you return to your primary care physician (or establish a relationship with a primary care physician if you do not have one) for your aftercare needs so that they can reassess your need for medications and monitor your lab values.   Increase activity slowly   Complete by: As directed       Allergies as of 04/06/2024   No Known Allergies      Medication List     STOP taking these medications    Aleve 220 MG tablet Generic drug: naproxen sodium   meloxicam  15 MG tablet Commonly known as: MOBIC    Zepbound  2.5 MG/0.5ML Pen Generic drug: tirzepatide    Zepbound  5 MG/0.5ML  Pen Generic drug: tirzepatide        TAKE these medications    amLODipine  10 MG tablet Commonly known as: NORVASC  Take 1 tablet (10 mg total) by mouth daily. What changed: when to take this   amoxicillin  500 MG capsule Commonly known as: AMOXIL  Take 4 capsules (2000 mg) by mouth 1 hour prior to dental work, including cleanings.   azithromycin  500 MG tablet Commonly known as: ZITHROMAX  Take 1 tablet by mouth daily   BIOTIN PO Take 1 tablet by mouth See admin instructions. Chew 1 tablet by mouth once a day   Camzyos  10 MG Caps capsule Generic drug: mavacamten  TAKE 1 CAPSULE BY  MOUTH DAILY What changed: when to take this   CoQ10 100 MG Caps Take 100 mg by mouth in the morning.   Eliquis  5 MG Tabs tablet Generic drug: apixaban  Take 1 tablet (5 mg total) by mouth 2 (two) times daily.   fluticasone  50 MCG/ACT nasal spray Commonly known as: FLONASE  Place 1 spray into both nostrils daily as needed for allergies.   gabapentin  300 MG capsule Commonly known as: NEURONTIN  Take 1 capsule (300 mg total) by mouth at bedtime. What changed: Another medication with the same name was changed. Make sure you understand how and when to take each.   gabapentin  300 MG capsule Commonly known as: NEURONTIN  Take 1 capsule (300 mg total) by mouth daily. What changed: when to take this   glucosamine-chondroitin 500-400 MG tablet Take 1 tablet by mouth 2 (two) times a week.   hydrALAZINE  25 MG tablet Commonly known as: APRESOLINE  Take 1 tablet (25 mg total) by mouth 3 (three) times daily as needed (for blood pressure above 160).   hydrochlorothiazide  12.5 MG capsule Commonly known as: MICROZIDE  Take 1 capsule (12.5 mg total) by mouth daily. What changed: when to take this   hydrocortisone  2.5 % cream Apply 1 Application to affected area topically at bedtime. What changed:  when to take this reasons to take this   irbesartan -hydrochlorothiazide  300-12.5 MG tablet Commonly known as: AVALIDE Take 1 tablet by mouth daily. What changed: when to take this   metoprolol  succinate 25 MG 24 hr tablet Commonly known as: Toprol  XL Take 1 tablet (25 mg total) by mouth daily.   neomycin -polymyxin b-dexamethasone  3.5-10000-0.1 Oint Commonly known as: MAXITROL  Apply 1/4 inch to both eyes at bedtime for 1 week.   omega-3 acid ethyl esters 1 g capsule Commonly known as: LOVAZA  Take 2 g by mouth 2 (two) times daily.   Pataday  0.1 % ophthalmic solution Generic drug: olopatadine  Place 1 drop into both eyes daily as needed for allergies.   PRESCRIPTION MEDICATION CPAP- At  bedtime   rosuvastatin  20 MG tablet Commonly known as: CRESTOR  Take 1 tablet (20 mg total) by mouth daily. What changed: when to take this   sildenafil 50 MG tablet Commonly known as: VIAGRA Take 50 mg by mouth daily as needed for erectile dysfunction.   spironolactone  25 MG tablet Commonly known as: Aldactone  Take 1 tablet (25 mg total) by mouth daily.   traMADol  50 MG tablet Commonly known as: ULTRAM  Take 1 tablet (50 mg total) by mouth 2 (two) times daily as needed for moderate pain.   Vitamin D3 125 MCG (5000 UT) Caps Take 1 capsule (5,000 Units total) by mouth 2 (two) times a week. What changed:  when to take this additional instructions        Follow-up Information     Yolande Toribio MATSU, MD. Schedule  an appointment as soon as possible for a visit.   Specialty: Internal Medicine Why: This week Contact information: [address] [place] [address] [phone]         Kriss Stagger H, DO Follow up in 4 week(s).   Specialty: Gastroenterology Contact information: [address] [place] [address] [phone]                Allergies[1]  Consultations: GI    Procedures/Studies: CT ANGIO GI BLEED Result Date: 04/04/2024 EXAM: CTA ABDOMEN AND PELVIS WITH CONTRAST 04/04/2024 10:34:03 AM TECHNIQUE: CTA images of the abdomen and pelvis with intravenous contrast. Three-dimensional MIP/volume rendered formations were performed. Automated exposure control, iterative reconstruction, and/or weight based adjustment of the mA/kV was utilized to reduce the radiation dose to as low as reasonably achievable. COMPARISON: MRI of the abdomen dated 11/21/2021. CLINICAL HISTORY: GI bleed. FINDINGS: VASCULATURE: GI BLEED: There is no convincing evidence of gastrointestinal tract hemorrhage. AORTA: The abdominal aorta is normal in caliber and demonstrates mild-to-moderate calcific atheromatous disease. No abdominal aortic aneurysm. No dissection.  CELIAC TRUNK: No acute finding. No occlusion or significant stenosis. SUPERIOR MESENTERIC ARTERY: No acute finding. No occlusion or significant stenosis. INFERIOR MESENTERIC ARTERY: No acute finding. No occlusion or significant stenosis. RENAL ARTERIES: No acute finding. No occlusion or significant stenosis. ILIAC ARTERIES: No acute finding. No occlusion or significant stenosis. ABDOMEN/PELVIS: LOWER CHEST: Visualized portion of the lower chest demonstrates no acute abnormality. LIVER: The liver is unremarkable. GALLBLADDER AND BILE DUCTS: Gallbladder is unremarkable. No biliary ductal dilatation. SPLEEN: The spleen is unremarkable. PANCREAS: The pancreas is unremarkable. ADRENAL GLANDS: Bilateral adrenal glands demonstrate no acute abnormality. KIDNEYS, URETERS AND BLADDER: There is a nonobstructive calculus in the superior pole of the right kidney, measuring about 5 mm in diameter. There is also a simple right renal cyst present, measuring approximately 3.6 cm. No hydronephrosis. No perinephric or periureteral stranding. Urinary bladder is unremarkable. GI AND BOWEL: The stomach and small bowel are unremarkable. There is diffuse abnormal thickening of the wall of the sigmoid colon and mid to distal descending colon. There are several scattered colonic diverticula also present. There is no bowel obstruction. No abnormal bowel wall thickening or distension. REPRODUCTIVE: Reproductive organs are unremarkable. PERITONEUM AND RETROPERITONEUM: There are bilateral fat-containing inguinal hernias present, left greater than right. No ascites or free air. LYMPH NODES: No lymphadenopathy. BONES AND SOFT TISSUES: No acute abnormality of the bones. No acute soft tissue abnormality. IMPRESSION: 1. No convincing evidence of gastrointestinal tract hemorrhage. 2. Diffuse abnormal thickening of the wall of the sigmoid colon and mid to distal descending colon with several scattered colonic diverticula. The findings are compatible  with colitis. 3. Nonobstructive calculus in the superior pole of the right kidney, measuring about 5 mm in diameter. 4. Simple right renal cyst measuring approximately 3.6 cm. No follow-up imaging is recommended. 5. Bilateral fat-containing inguinal hernias, left greater than right. Electronically signed by: Evalene Coho MD 04/04/2024 11:47 AM EST RP Workstation: [id number]       Discharge Exam: Vitals:   04/05/24 2113 04/06/24 0615  BP: 137/75 124/73  Pulse:  65  Resp:  20  Temp:  97.9 F (36.6 C)  SpO2:  97%    General: Pt is alert, awake, not in acute distress Cardiovascular: RRR, S1/S2 +, no edema Respiratory: CTA bilaterally, no wheezing, no rhonchi, no respiratory distress, no conversational dyspnea  Abdominal: Soft, NT, ND, bowel sounds + Extremities: no edema, no cyanosis Psych: Normal mood  and affect, stable judgement and insight     The results of significant diagnostics from this hospitalization (including imaging, microbiology, ancillary and laboratory) are listed below for reference.     Microbiology: Recent Results (from the past 240 hours)  C Difficile Quick Screen w PCR reflex     Status: Abnormal   Collection Time: 04/04/24 12:13 PM   Specimen: STOOL  Result Value Ref Range Status   C Diff antigen POSITIVE (A) NEGATIVE Final   C Diff toxin NEGATIVE NEGATIVE Final   C Diff interpretation Results are indeterminate. See PCR results.  Final    Comment: Performed at [facility], [address] [place], [place] [address]  Gastrointestinal Panel by PCR , Stool     Status: Abnormal   Collection Time: 04/04/24 12:13 PM   Specimen: Stool  Result Value Ref Range Status   Campylobacter species NOT DETECTED NOT DETECTED Final   Plesimonas shigelloides NOT DETECTED NOT DETECTED Final   Salmonella species NOT DETECTED NOT DETECTED Final   Yersinia enterocolitica NOT DETECTED NOT DETECTED Final   Vibrio species NOT DETECTED NOT DETECTED Final   Vibrio  cholerae NOT DETECTED NOT DETECTED Final   Enteroaggregative E coli (EAEC) NOT DETECTED NOT DETECTED Final   Enteropathogenic E coli (EPEC) DETECTED (A) NOT DETECTED Final    Comment: RESULT CALLED TO, READ BACK BY AND VERIFIED WITH: HILLARY MASHBURN 04/04/24 1711 MU    Enterotoxigenic E coli (ETEC) NOT DETECTED NOT DETECTED Final   Shiga like toxin producing E coli (STEC) NOT DETECTED NOT DETECTED Final   Shigella/Enteroinvasive E coli (EIEC) NOT DETECTED NOT DETECTED Final   Cryptosporidium NOT DETECTED NOT DETECTED Final   Cyclospora cayetanensis NOT DETECTED NOT DETECTED Final   Entamoeba histolytica NOT DETECTED NOT DETECTED Final   Giardia lamblia NOT DETECTED NOT DETECTED Final   Adenovirus F40/41 NOT DETECTED NOT DETECTED Final   Astrovirus NOT DETECTED NOT DETECTED Final   Norovirus GI/GII NOT DETECTED NOT DETECTED Final   Rotavirus A NOT DETECTED NOT DETECTED Final   Sapovirus (I, II, IV, and V) NOT DETECTED NOT DETECTED Final    Comment: Performed at [facility], [address] [place], [place] [address]  C. Diff by PCR, Reflexed     Status: Abnormal   Collection Time: 04/04/24 12:13 PM  Result Value Ref Range Status   Toxigenic C. Difficile by PCR POSITIVE (A) NEGATIVE Final    Comment: Positive for toxigenic C. difficile with little to no toxin production. Only treat if clinical presentation suggests symptomatic illness.   Hypervirulent Strain PRESUMPTIVE NEGATIVE PRESUMPTIVE NEGATIVE Final    Comment: Performed at [facility], [address] [place], [place] [address]     Labs: BNP (last 3 results) No results for input(s): BNP in the last 8760 hours. Basic Metabolic Panel: Recent Labs  Lab 04/04/24 0942 04/05/24 0610 04/06/24 0418  NA 140 139 144  K 4.2 3.4* 3.4*  CL 102 106 111  CO2 25 24 23   GLUCOSE 110* 90 126*  BUN 29* 17 18  CREATININE 1.68* 1.25* 1.24  CALCIUM  10.2 8.5* 8.4*  MG  --   --  1.5*  PHOS  --   --  3.9   Liver  Function Tests: Recent Labs  Lab 04/04/24 0942  AST 29  ALT 34  ALKPHOS 58  BILITOT 0.5  PROT 7.3  ALBUMIN 4.7   Recent Labs  Lab 04/04/24 0942  LIPASE 120*   No results  for input(s): AMMONIA in the last 168 hours. CBC: Recent Labs  Lab 04/04/24 0942 04/05/24 0610 04/06/24 0418  WBC 14.7* 11.0* 9.3  NEUTROABS  --   --  7.1  HGB 15.2 13.4 12.1*  HCT 44.3 40.0 36.7*  MCV 85.9 86.0 87.4  PLT 214 180 163   Cardiac Enzymes: No results for input(s): CKTOTAL, CKMB, CKMBINDEX, TROPONINI in the last 168 hours. BNP: Invalid input(s): POCBNP CBG: No results for input(s): GLUCAP in the last 168 hours. D-Dimer No results for input(s): DDIMER in the last 72 hours. Hgb A1c No results for input(s): HGBA1C in the last 72 hours. Lipid Profile No results for input(s): CHOL, HDL, LDLCALC, TRIG, CHOLHDL, LDLDIRECT in the last 72 hours. Thyroid function studies No results for input(s): TSH, T4TOTAL, T3FREE, THYROIDAB in the last 72 hours.  Invalid input(s): FREET3 Anemia work up No results for input(s): VITAMINB12, FOLATE, FERRITIN, TIBC, IRON, RETICCTPCT in the last 72 hours. Urinalysis    Component Value Date/Time   COLORURINE YELLOW 09/15/2015 0941   APPEARANCEUR TURBID (A) 09/15/2015 0941   LABSPEC 1.019 09/15/2015 0941   PHURINE 7.0 09/15/2015 0941   GLUCOSEU NEGATIVE 09/15/2015 0941   HGBUR NEGATIVE 09/15/2015 0941   BILIRUBINUR NEGATIVE 09/15/2015 0941   KETONESUR NEGATIVE 09/15/2015 0941   PROTEINUR 30 (A) 09/15/2015 0941   NITRITE NEGATIVE 09/15/2015 0941   LEUKOCYTESUR NEGATIVE 09/15/2015 0941   Sepsis Labs Recent Labs  Lab 04/04/24 0942 04/05/24 0610 04/06/24 0418  WBC 14.7* 11.0* 9.3   Microbiology Recent Results (from the past 240 hours)  C Difficile Quick Screen w PCR reflex     Status: Abnormal   Collection Time: 04/04/24 12:13 PM   Specimen: STOOL  Result Value Ref Range Status   C Diff antigen  POSITIVE (A) NEGATIVE Final   C Diff toxin NEGATIVE NEGATIVE Final   C Diff interpretation Results are indeterminate. See PCR results.  Final    Comment: Performed at [facility], [address], [place], [place] [address]  Gastrointestinal Panel by PCR , Stool     Status: Abnormal   Collection Time: 04/04/24 12:13 PM   Specimen: Stool  Result Value Ref Range Status   Campylobacter species NOT DETECTED NOT DETECTED Final   Plesimonas shigelloides NOT DETECTED NOT DETECTED Final   Salmonella species NOT DETECTED NOT DETECTED Final   Yersinia enterocolitica NOT DETECTED NOT DETECTED Final   Vibrio species NOT DETECTED NOT DETECTED Final   Vibrio cholerae NOT DETECTED NOT DETECTED Final   Enteroaggregative E coli (EAEC) NOT DETECTED NOT DETECTED Final   Enteropathogenic E coli (EPEC) DETECTED (A) NOT DETECTED Final    Comment: RESULT CALLED TO, READ BACK BY AND VERIFIED WITH: HILLARY MASHBURN 04/04/24 1711 MU    Enterotoxigenic E coli (ETEC) NOT DETECTED NOT DETECTED Final   Shiga like toxin producing E coli (STEC) NOT DETECTED NOT DETECTED Final   Shigella/Enteroinvasive E coli (EIEC) NOT DETECTED NOT DETECTED Final   Cryptosporidium NOT DETECTED NOT DETECTED Final   Cyclospora cayetanensis NOT DETECTED NOT DETECTED Final   Entamoeba histolytica NOT DETECTED NOT DETECTED Final   Giardia lamblia NOT DETECTED NOT DETECTED Final   Adenovirus F40/41 NOT DETECTED NOT DETECTED Final   Astrovirus NOT DETECTED NOT DETECTED Final   Norovirus GI/GII NOT DETECTED NOT DETECTED Final   Rotavirus A NOT DETECTED NOT DETECTED Final   Sapovirus (I, II, IV, and V) NOT DETECTED NOT DETECTED Final    Comment: Performed at [facility], [address] [place], [place] [address]  C. Diff by PCR, Reflexed     Status: Abnormal   Collection Time: 04/04/24 12:13 PM  Result Value Ref Range Status   Toxigenic C. Difficile by PCR POSITIVE (A) NEGATIVE Final    Comment: Positive for toxigenic C.  difficile with little to no toxin production. Only treat if clinical presentation suggests symptomatic illness.   Hypervirulent Strain PRESUMPTIVE NEGATIVE PRESUMPTIVE NEGATIVE Final    Comment: Performed at [facility], [address], [place], [place] [address]     Patient was seen and examined on the day of discharge and was found to be in stable condition. Time coordinating discharge: 35 minutes including assessment and coordination of care, as well as examination of the patient.   SIGNED:  Delon Hoe, DO [facility] 04/06/2024, 12:15 PM       [1] No Known Allergies

## 2024-04-06 NOTE — Progress Notes (Signed)
" °   04/06/24 0957  TOC Brief Assessment  Insurance and Status Reviewed  Patient has primary care physician Yes  Home environment has been reviewed home with self  Prior level of function: independent  Prior/Current Home Services No current home services  Social Drivers of Health Review SDOH reviewed no interventions necessary  Readmission risk has been reviewed Yes  Transition of care needs no transition of care needs at this time   Pt will get him self an uber if friend can not pick up at d/c.  "

## 2024-04-08 ENCOUNTER — Other Ambulatory Visit (HOSPITAL_COMMUNITY): Payer: Self-pay

## 2024-04-08 MED ORDER — JOURNAVX 50 MG PO TABS
ORAL_TABLET | ORAL | 0 refills | Status: AC
  Filled 2024-04-08: qty 21, 10d supply, fill #0

## 2024-04-08 MED ORDER — MOVANTIK 25 MG PO TABS
25.0000 mg | ORAL_TABLET | Freq: Every morning | ORAL | 0 refills | Status: AC
  Filled 2024-04-08: qty 7, 7d supply, fill #0

## 2024-04-12 ENCOUNTER — Other Ambulatory Visit (HOSPITAL_COMMUNITY): Payer: Self-pay

## 2024-04-12 ENCOUNTER — Other Ambulatory Visit: Payer: Self-pay

## 2024-04-12 MED ORDER — AMLODIPINE BESYLATE 10 MG PO TABS
10.0000 mg | ORAL_TABLET | Freq: Every day | ORAL | 3 refills | Status: AC
  Filled 2024-04-12: qty 90, 90d supply, fill #0

## 2024-04-20 ENCOUNTER — Other Ambulatory Visit: Payer: Self-pay | Admitting: Internal Medicine

## 2024-04-20 ENCOUNTER — Other Ambulatory Visit: Payer: Self-pay

## 2024-04-20 ENCOUNTER — Other Ambulatory Visit (HOSPITAL_COMMUNITY): Payer: Self-pay

## 2024-04-20 MED ORDER — METOPROLOL SUCCINATE ER 25 MG PO TB24
25.0000 mg | ORAL_TABLET | Freq: Every day | ORAL | 3 refills | Status: AC
  Filled 2024-04-20: qty 90, 90d supply, fill #0

## 2024-06-17 ENCOUNTER — Ambulatory Visit (HOSPITAL_COMMUNITY)
